# Patient Record
Sex: Female | Born: 1950
Health system: Southern US, Community
[De-identification: ages and names within clinical notes are randomized; demographics above are authoritative.]

## PROBLEM LIST (undated history)

## (undated) DIAGNOSIS — I499 Cardiac arrhythmia, unspecified: Secondary | ICD-10-CM

## (undated) DIAGNOSIS — I639 Cerebral infarction, unspecified: Secondary | ICD-10-CM

## (undated) DIAGNOSIS — I10 Essential (primary) hypertension: Secondary | ICD-10-CM

## (undated) DIAGNOSIS — G8929 Other chronic pain: Secondary | ICD-10-CM

## (undated) DIAGNOSIS — G43909 Migraine, unspecified, not intractable, without status migrainosus: Secondary | ICD-10-CM

## (undated) DIAGNOSIS — E7800 Pure hypercholesterolemia, unspecified: Secondary | ICD-10-CM

## (undated) DIAGNOSIS — M419 Scoliosis, unspecified: Secondary | ICD-10-CM

## (undated) DIAGNOSIS — M549 Dorsalgia, unspecified: Secondary | ICD-10-CM

## (undated) HISTORY — PX: ROTATOR CUFF REPAIR: SHX139

## (undated) HISTORY — PX: ABDOMINAL HYSTERECTOMY: SHX81

## (undated) HISTORY — PX: BUNIONECTOMY: SHX129

---

## 2010-11-20 ENCOUNTER — Emergency Department (HOSPITAL_COMMUNITY): Admission: EM | Admit: 2010-11-20 | Discharge: 2010-11-20 | Payer: Self-pay | Admitting: Emergency Medicine

## 2010-11-20 ENCOUNTER — Emergency Department (HOSPITAL_COMMUNITY): Admission: EM | Admit: 2010-11-20 | Discharge: 2010-11-20 | Payer: Self-pay | Admitting: Family Medicine

## 2011-05-03 ENCOUNTER — Emergency Department (HOSPITAL_COMMUNITY)
Admission: EM | Admit: 2011-05-03 | Discharge: 2011-05-03 | Disposition: A | Discharge status: Home / Self Care | Payer: Managed Care, Other (non HMO) | Attending: Emergency Medicine | Admitting: Emergency Medicine

## 2011-05-03 ENCOUNTER — Emergency Department (HOSPITAL_COMMUNITY): Payer: Managed Care, Other (non HMO)

## 2011-05-03 DIAGNOSIS — S0003XA Contusion of scalp, initial encounter: Secondary | ICD-10-CM | POA: Insufficient documentation

## 2011-05-03 DIAGNOSIS — Z79899 Other long term (current) drug therapy: Secondary | ICD-10-CM | POA: Insufficient documentation

## 2011-05-03 DIAGNOSIS — R296 Repeated falls: Secondary | ICD-10-CM | POA: Insufficient documentation

## 2011-05-03 DIAGNOSIS — Y9229 Other specified public building as the place of occurrence of the external cause: Secondary | ICD-10-CM | POA: Insufficient documentation

## 2011-05-03 DIAGNOSIS — H538 Other visual disturbances: Secondary | ICD-10-CM | POA: Insufficient documentation

## 2011-05-03 DIAGNOSIS — S0083XA Contusion of other part of head, initial encounter: Secondary | ICD-10-CM | POA: Insufficient documentation

## 2011-05-03 DIAGNOSIS — R569 Unspecified convulsions: Secondary | ICD-10-CM | POA: Insufficient documentation

## 2011-05-03 LAB — CBC
HCT: 35.3 % — ABNORMAL LOW (ref 36.0–46.0)
Hemoglobin: 12 g/dL (ref 12.0–15.0)
RBC: 3.66 MIL/uL — ABNORMAL LOW (ref 3.87–5.11)
WBC: 9.4 10*3/uL (ref 4.0–10.5)

## 2011-05-03 LAB — COMPREHENSIVE METABOLIC PANEL
ALT: 11 U/L (ref 0–35)
AST: 16 U/L (ref 0–37)
Alkaline Phosphatase: 81 U/L (ref 39–117)
CO2: 30 mEq/L (ref 19–32)
Calcium: 8.7 mg/dL (ref 8.4–10.5)
Chloride: 100 mEq/L (ref 96–112)
GFR calc Af Amer: >60 mL/min (ref >60)
GFR calc non Af Amer: >60 mL/min (ref >60)
Glucose, Bld: 83 mg/dL (ref 70–99)
Potassium: 3.5 mEq/L (ref 3.5–5.1)
Sodium: 136 mEq/L (ref 135–145)
Total Bilirubin: 0.2 mg/dL — ABNORMAL LOW (ref 0.3–1.2)

## 2011-05-03 LAB — DIFFERENTIAL
Basophils Absolute: 0 10*3/uL (ref 0.0–0.1)
Basophils Relative: 0 % (ref 0–1)
Lymphocytes Relative: 15 % (ref 12–46)
Monocytes Absolute: 0.6 10*3/uL (ref 0.1–1.0)
Neutro Abs: 7.2 10*3/uL (ref 1.7–7.7)
Neutrophils Relative %: 77 % (ref 43–77)

## 2011-05-03 LAB — URINE MICROSCOPIC-ADD ON

## 2011-05-03 LAB — URINALYSIS, ROUTINE W REFLEX MICROSCOPIC
Bilirubin Urine: NEGATIVE
Ketones, ur: NEGATIVE mg/dL
Nitrite: POSITIVE — AB
Protein, ur: NEGATIVE mg/dL
pH: 7.5 (ref 5.0–8.0)

## 2011-05-06 LAB — URINE CULTURE
Colony Count: >=100000
Culture  Setup Time: 201205031951

## 2012-07-30 ENCOUNTER — Encounter (HOSPITAL_BASED_OUTPATIENT_CLINIC_OR_DEPARTMENT_OTHER): Payer: Self-pay | Admitting: Emergency Medicine

## 2012-07-30 ENCOUNTER — Emergency Department (HOSPITAL_BASED_OUTPATIENT_CLINIC_OR_DEPARTMENT_OTHER)
Admission: EM | Admit: 2012-07-30 | Discharge: 2012-07-30 | Disposition: A | Discharge status: Home / Self Care | Payer: Self-pay | Attending: Emergency Medicine | Admitting: Emergency Medicine

## 2012-07-30 DIAGNOSIS — L089 Local infection of the skin and subcutaneous tissue, unspecified: Secondary | ICD-10-CM

## 2012-07-30 DIAGNOSIS — W540XXA Bitten by dog, initial encounter: Secondary | ICD-10-CM | POA: Insufficient documentation

## 2012-07-30 DIAGNOSIS — IMO0002 Reserved for concepts with insufficient information to code with codable children: Secondary | ICD-10-CM | POA: Insufficient documentation

## 2012-07-30 HISTORY — DX: Migraine, unspecified, not intractable, without status migrainosus: G43.909

## 2012-07-30 MED ORDER — ESTRADIOL 0.025 MG/24HR TD PTTW: 1.0000 | MEDICATED_PATCH | TRANSDERMAL | Status: DC

## 2012-07-30 MED ORDER — HYDROCODONE-ACETAMINOPHEN 5-325 MG PO TABS: 2.0000 | ORAL_TABLET | ORAL | Status: AC | PRN

## 2012-07-30 MED ORDER — FLUTICASONE-SALMETEROL 250-50 MCG/DOSE IN AEPB: 1.0000 | INHALATION_SPRAY | Freq: Once | RESPIRATORY_TRACT | Status: DC

## 2012-07-30 MED ORDER — DOXYCYCLINE HYCLATE 100 MG PO CAPS: 100.0000 mg | ORAL_CAPSULE | Freq: Two times a day (BID) | ORAL | Status: AC

## 2012-07-30 NOTE — ED Provider Notes (Signed)
History     CSN: [id number]  Arrival date & time 07/30/12  1712   First MD Initiated Contact with Patient 07/30/12 1747      Chief Complaint  Patient presents with  . Abrasion  . Arm Pain  . Nausea    (Consider location/radiation/quality/duration/timing/severity/associated sxs/prior treatment) Patient is a 62 y.o. female presenting with arm pain. The history is provided by the patient.  Arm Pain This is a new problem. The current episode started 1 to 4 weeks ago. The problem occurs constantly. The problem has been gradually worsening. Nothing aggravates the symptoms. She has tried nothing for the symptoms.  Pt reports her puppy is chewing on her and scratching her.   Pt also reports she is out of her hormone patch and request rx until she can get an MD. Pt also request advair.  Past Medical History  Diagnosis Date  . Migraine     Past Surgical History  Procedure Date  . Abdominal hysterectomy   . Rotator cuff repair     No family history on file.  History  Substance Use Topics  . Smoking status: Not on file  . Smokeless tobacco: Not on file  . Alcohol Use:     OB History    Grav Para Term Preterm Abortions TAB SAB Ect Mult Living                  Review of Systems  Skin: Positive for wound.  All other systems reviewed and are negative.    Allergies  Penicillins; Aspirin; Darvon; Ibuprofen; Imitrex; and Sulfa antibiotics  Home Medications   Current Outpatient Rx  Name Route Sig Dispense Refill  . DOXYCYCLINE HYCLATE 100 MG PO CAPS Oral Take 1 capsule (100 mg total) by mouth 2 (two) times daily. 20 capsule 0  . ESTRADIOL 0.025 MG/24HR TD PTTW Transdermal Place 1 patch onto the skin 2 (two) times a week. 8 patch 12    BP 99/65  Pulse 93  Temp 98.5 F (36.9 C) (Oral)  Resp 20  Ht 5\' 5"  (1.651 m)  Wt 114 lb (51.71 kg)  BMI 18.97 kg/m2  SpO2 98%  Physical Exam  Nursing note and vitals reviewed. Constitutional: She is oriented to person, place,  and time. She appears well-developed and well-nourished.  HENT:  Head: Normocephalic and atraumatic.  Musculoskeletal: She exhibits tenderness.       Abrasions, puntures, erythema   Neurological: She is alert and oriented to person, place, and time. She has normal reflexes.  Skin: There is erythema.       Multiple abrasions,  Superficial punture wounds erythema left arm 2cm area around a punture,  Right arm,  Multiple puntures,one open area, erythematous    ED Course  Procedures (including critical care time)  Labs Reviewed - No data to display No results found.       MDM  rx for doxy,  Advair, vivelle and hydrocodone for pain       Lonia Skinner [place], [place] 07/30/12 1817  Lonia Skinner [place], [place] 07/30/12 [phone]

## 2012-07-30 NOTE — ED Notes (Signed)
Pt has multiple scratches to arms.  Pt states her puppy caused it.  Pt states she started having nausea this am and right forearm is starting to ache.

## 2012-08-03 NOTE — ED Provider Notes (Signed)
Medical screening examination/treatment/procedure(s) were performed by non-physician practitioner and as supervising physician I was immediately available for consultation/collaboration.  Vonnetta Akey, MD 08/03/12 1455 

## 2012-09-02 ENCOUNTER — Emergency Department (HOSPITAL_BASED_OUTPATIENT_CLINIC_OR_DEPARTMENT_OTHER)
Admission: EM | Admit: 2012-09-02 | Discharge: 2012-09-03 | Disposition: A | Discharge status: Home / Self Care | Payer: Self-pay | Attending: Emergency Medicine | Admitting: Emergency Medicine

## 2012-09-02 ENCOUNTER — Emergency Department (HOSPITAL_BASED_OUTPATIENT_CLINIC_OR_DEPARTMENT_OTHER): Payer: Self-pay

## 2012-09-02 ENCOUNTER — Encounter (HOSPITAL_BASED_OUTPATIENT_CLINIC_OR_DEPARTMENT_OTHER): Payer: Self-pay | Admitting: *Deleted

## 2012-09-02 DIAGNOSIS — M549 Dorsalgia, unspecified: Secondary | ICD-10-CM | POA: Insufficient documentation

## 2012-09-02 DIAGNOSIS — N390 Urinary tract infection, site not specified: Secondary | ICD-10-CM | POA: Insufficient documentation

## 2012-09-02 DIAGNOSIS — F17200 Nicotine dependence, unspecified, uncomplicated: Secondary | ICD-10-CM | POA: Insufficient documentation

## 2012-09-02 LAB — URINALYSIS, ROUTINE W REFLEX MICROSCOPIC
Bilirubin Urine: NEGATIVE
Glucose, UA: NEGATIVE mg/dL
Ketones, ur: NEGATIVE mg/dL
Protein, ur: NEGATIVE mg/dL
pH: 6 (ref 5.0–8.0)

## 2012-09-02 MED ORDER — DEXAMETHASONE SODIUM PHOSPHATE 10 MG/ML IJ SOLN
10.0000 mg | Freq: Once | INTRAMUSCULAR | Status: AC
  Administered 2012-09-02: 10 mg via INTRAVENOUS
  Filled 2012-09-02: qty 1

## 2012-09-02 MED ORDER — HYDROMORPHONE HCL PF 1 MG/ML IJ SOLN
1.0000 mg | Freq: Once | INTRAMUSCULAR | Status: AC
  Administered 2012-09-02: 1 mg via INTRAVENOUS
  Filled 2012-09-02: qty 1

## 2012-09-02 NOTE — ED Provider Notes (Signed)
History     CSN: [id number]  Arrival date & time 09/02/12  2017   First MD Initiated Contact with Patient 09/02/12 2256      Chief Complaint  Patient presents with  . Back Pain    (Consider location/radiation/quality/duration/timing/severity/associated sxs/prior treatment) HPI Comments: 62 y/o female - has had back pain for "some time", for about 4 weeks, is getting worse, doesn't go tot he doctor often.  Has been taking flexeril without relief.  The pain is in the lower back, above the waist on the L, and radiates into the L hip and to the foot.  Stabbing pain, constant, more painful with movement, especially with bending at the waist.  Improved with laying on side curled up.  Also complains of headache X 3 days and swellignin the R neck, no f/c, mild nausea, no vomiting.  Also c/o numbness in the L leg which has been present X 1 week.  This a complete leg numbness.  She has been ambulating but has pain with ambulating.  Left leg gets tired with walking.    No hx of IVDU, fevers, CA, intermittent urinary hesitancy, no Urinary retention or incontinence but has had some strong smelling urine.  She has no weakness of the LLE.  No hx of back pain.   Patient is a 62 y.o. female presenting with back pain. The history is provided by the patient.  Back Pain     Past Medical History  Diagnosis Date  . Migraine     Past Surgical History  Procedure Date  . Abdominal hysterectomy   . Rotator cuff repair     No family history on file.  History  Substance Use Topics  . Smoking status: Current Everyday Smoker -- 0.5 packs/day  . Smokeless tobacco: Not on file  . Alcohol Use: No    OB History    Grav Para Term Preterm Abortions TAB SAB Ect Mult Living                  Review of Systems  Musculoskeletal: Positive for back pain.  All other systems reviewed and are negative.    Allergies  Penicillins; Aspirin; Darvon; Ibuprofen; Imitrex; Monosodium glutamate; Nitrates, organic;  Nsaids; and Sulfa antibiotics  Home Medications   Current Outpatient Rx  Name Route Sig Dispense Refill  . ACETAMINOPHEN 500 MG PO TABS Oral Take 1,000-1,500 mg by mouth every 6 (six) hours as needed. For back pain    . ASPIRIN 81 MG PO TABS Oral Take 81 mg by mouth daily.    Marland Kitchen CALCIUM CARBONATE ANTACID 1000 MG PO CHEW Oral Chew 1,000 mg by mouth daily.    Marland Kitchen VITAMIN D 1000 UNITS PO TABS Oral Take 2,000 Units by mouth 2 (two) times daily.     . CYCLOBENZAPRINE HCL 5 MG PO TABS Oral Take 5 mg by mouth at bedtime as needed. For pain and sleep    . ESCITALOPRAM OXALATE 10 MG PO TABS Oral Take 10 mg by mouth daily.    Marland Kitchen ESTRADIOL 0.025 MG/24HR TD PTTW Transdermal Place 1 patch onto the skin 2 (two) times a week. 8 patch 12  . METOPROLOL TARTRATE 25 MG PO TABS Oral Take 12.5 mg by mouth 2 (two) times daily.    Marland Kitchen SIMVASTATIN 10 MG PO TABS Oral Take 10 mg by mouth at bedtime.    . OXYCODONE-ACETAMINOPHEN 5-325 MG PO TABS Oral Take 1 tablet by mouth every 4 (four) hours as needed for pain. 20 tablet  0    BP 107/51  Pulse 106  Temp 98 F (36.7 C) (Oral)  Resp 18  SpO2 98%  Physical Exam  Nursing note and vitals reviewed. Constitutional: She appears well-developed and well-nourished. No distress.  HENT:  Head: Normocephalic and atraumatic.  Mouth/Throat: Oropharynx is clear and moist. No oropharyngeal exudate.  Eyes: Conjunctivae and EOM are normal. Pupils are equal, round, and reactive to light. Right eye exhibits no discharge. Left eye exhibits no discharge. No scleral icterus.  Neck: Normal range of motion. Neck supple. No JVD present. No thyromegaly present.  Cardiovascular: Normal rate, regular rhythm, normal heart sounds and intact distal pulses.  Exam reveals no gallop and no friction rub.   No murmur heard. Pulmonary/Chest: Effort normal and breath sounds normal. No respiratory distress. She has no wheezes. She has no rales.  Abdominal: Soft. Bowel sounds are normal. She exhibits no  distension and no mass. There is no tenderness.  Musculoskeletal: Normal range of motion. She exhibits no edema and no tenderness.  Lymphadenopathy:    She has no cervical adenopathy.  Neurological: She is alert. Coordination normal.       Neurologic exam:  Speech clear, pupils equal round reactive to light, extraocular movements intact  Normal peripheral visual fields Cranial nerves III through XII normal including no facial droop Follows commands, moves all extremities x4, normal strength to bilateral upper and lower extremities at all major muscle groups including grip Sensation normal to light touch and pinprick of bil UE's, but LLE with dec sens to light touch and pinprick.  + straight leg raise test.  No crossed straight leg raise sign Coordination intact, no limb ataxia, finger-nose-finger normal Rapid alternating movements normal No pronator drift Gait normal   Skin: Skin is warm and dry. No rash noted. No erythema.  Psychiatric: She has a normal mood and affect. Her behavior is normal.    ED Course  Procedures (including critical care time)  Labs Reviewed  URINALYSIS, ROUTINE W REFLEX MICROSCOPIC - Abnormal; Notable for the following:    Nitrite POSITIVE (*)     All other components within normal limits  URINE MICROSCOPIC-ADD ON - Abnormal; Notable for the following:    Bacteria, UA MANY (*)     All other components within normal limits   Dg Lumbar Spine Complete  09/03/2012  *RADIOLOGY REPORT*  Clinical Data: Back pain for 3 weeks with left upper and lower extremity numbness.  No known injury.  LUMBAR SPINE - COMPLETE 4+ VIEW  Comparison: Chest and rib radiographs 11/20/2010.  Findings: There are small ribs at T12 and five lumbar type vertebral bodies.  There is a moderate convex right scoliosis centered at L3.  There is a convex left lower thoracic scoliosis. The lateral alignment is normal.  There is lower lumbar spondylosis with disc space loss, intervertebral spurring  and facet hypertrophy.  There is no evidence of acute fracture or pars defect.  Bilateral pelvic calcifications are likely phleboliths.  IMPRESSION: Thoracolumbar scoliosis with associated spondylosis.  No acute osseous findings.   Original Report Authenticated By: Gerrianne Scale, M.D.      1. Back pain   2. UTI (lower urinary tract infection)       MDM  Concern for a pinched nerve / sciatica - will arrange outpt MRI, and needs NS follow up.  Decadron, toradol, dilaudid   11:35, d/w Dr. Franky Macho who will see in clinic - will set up for MRIin AM  Patient was reevaluated and has  significant improvement in her symptoms after receiving pain medication and Decadron. She has ambulated to the bathroom with minimal difficulty and had less than 50 cc of postvoid residual after urinating. At this time until the patient is stable for discharge and followup with the neurosurgeon who has agreed to see her in his clinic. An MRI has been scheduled for the morning and she will be discharged with Bactrim and Percocet.  Vida Roller, MD 09/03/12 [phone]

## 2012-09-02 NOTE — ED Notes (Signed)
Back pain for several weeks. No known injury.

## 2012-09-03 MED ORDER — OXYCODONE-ACETAMINOPHEN 5-325 MG PO TABS: 1.0000 | ORAL_TABLET | ORAL | Status: DC | PRN

## 2012-09-03 MED ORDER — CIPROFLOXACIN HCL 500 MG PO TABS: 500.0000 mg | ORAL_TABLET | Freq: Two times a day (BID) | ORAL | Status: AC

## 2012-09-03 NOTE — ED Notes (Signed)
Pt. Up to restroom - walks with no assistance and stedy gait.  Pt. Is in no distress and reports no pain at this time.  Pt. Had output of urine with reports to Dr. Hyacinth Meeker.  Pt. Able to move legs bilat. And bend knees bilat. Pt. Bends over to put shoes on and is able to stand erect after bending.

## 2012-09-04 LAB — URINE CULTURE

## 2012-09-05 NOTE — ED Notes (Signed)
+  Urine. Patient treated with Cipro. Sensitive to same. Per protocol MD. °

## 2012-09-09 ENCOUNTER — Emergency Department (HOSPITAL_BASED_OUTPATIENT_CLINIC_OR_DEPARTMENT_OTHER)
Admission: EM | Admit: 2012-09-09 | Discharge: 2012-09-10 | Disposition: A | Discharge status: Home / Self Care | Payer: Self-pay | Attending: Emergency Medicine | Admitting: Emergency Medicine

## 2012-09-09 ENCOUNTER — Emergency Department (HOSPITAL_BASED_OUTPATIENT_CLINIC_OR_DEPARTMENT_OTHER): Payer: Self-pay

## 2012-09-09 ENCOUNTER — Encounter (HOSPITAL_BASED_OUTPATIENT_CLINIC_OR_DEPARTMENT_OTHER): Payer: Self-pay

## 2012-09-09 DIAGNOSIS — Z79899 Other long term (current) drug therapy: Secondary | ICD-10-CM | POA: Insufficient documentation

## 2012-09-09 DIAGNOSIS — R509 Fever, unspecified: Secondary | ICD-10-CM | POA: Insufficient documentation

## 2012-09-09 DIAGNOSIS — R21 Rash and other nonspecific skin eruption: Secondary | ICD-10-CM | POA: Insufficient documentation

## 2012-09-09 DIAGNOSIS — R109 Unspecified abdominal pain: Secondary | ICD-10-CM | POA: Insufficient documentation

## 2012-09-09 DIAGNOSIS — A779 Spotted fever, unspecified: Secondary | ICD-10-CM | POA: Insufficient documentation

## 2012-09-09 DIAGNOSIS — A770 Spotted fever due to Rickettsia rickettsii: Secondary | ICD-10-CM

## 2012-09-09 DIAGNOSIS — M545 Low back pain, unspecified: Secondary | ICD-10-CM | POA: Insufficient documentation

## 2012-09-09 DIAGNOSIS — F17200 Nicotine dependence, unspecified, uncomplicated: Secondary | ICD-10-CM | POA: Insufficient documentation

## 2012-09-09 LAB — CBC WITH DIFFERENTIAL/PLATELET
Basophils Relative: 0 % (ref 0–1)
Hemoglobin: 10.9 g/dL — ABNORMAL LOW (ref 12.0–15.0)
Lymphocytes Relative: 17 % (ref 12–46)
Lymphs Abs: 1.6 10*3/uL (ref 0.7–4.0)
MCHC: 33.6 g/dL (ref 30.0–36.0)
Monocytes Relative: 8 % (ref 3–12)
Neutro Abs: 6.9 10*3/uL (ref 1.7–7.7)
Neutrophils Relative %: 72 % (ref 43–77)
RBC: 3.38 MIL/uL — ABNORMAL LOW (ref 3.87–5.11)
WBC: 9.6 10*3/uL (ref 4.0–10.5)

## 2012-09-09 LAB — URINALYSIS, ROUTINE W REFLEX MICROSCOPIC
Leukocytes, UA: NEGATIVE
Nitrite: NEGATIVE
Specific Gravity, Urine: 1.013 (ref 1.005–1.030)
pH: 6.5 (ref 5.0–8.0)

## 2012-09-09 LAB — COMPREHENSIVE METABOLIC PANEL
Albumin: 3.3 g/dL — ABNORMAL LOW (ref 3.5–5.2)
Alkaline Phosphatase: 80 U/L (ref 39–117)
BUN: 9 mg/dL (ref 6–23)
Chloride: 95 mEq/L — ABNORMAL LOW (ref 96–112)
Potassium: 3.9 mEq/L (ref 3.5–5.1)
Total Bilirubin: 0.2 mg/dL — ABNORMAL LOW (ref 0.3–1.2)

## 2012-09-09 MED ORDER — DOXYCYCLINE HYCLATE 100 MG PO CAPS: 100.0000 mg | ORAL_CAPSULE | Freq: Two times a day (BID) | ORAL | Status: AC

## 2012-09-09 MED ORDER — ONDANSETRON HCL 4 MG/2ML IJ SOLN
4.0000 mg | Freq: Once | INTRAMUSCULAR | Status: AC
  Administered 2012-09-09: 4 mg via INTRAVENOUS
  Filled 2012-09-09: qty 2

## 2012-09-09 MED ORDER — SODIUM CHLORIDE 0.9 % IV BOLUS (SEPSIS)
1000.0000 mL | Freq: Once | INTRAVENOUS | Status: AC
  Administered 2012-09-09: 1000 mL via INTRAVENOUS

## 2012-09-09 MED ORDER — DOXYCYCLINE HYCLATE 100 MG PO TABS
100.0000 mg | ORAL_TABLET | Freq: Once | ORAL | Status: AC
  Administered 2012-09-09: 100 mg via ORAL
  Filled 2012-09-09: qty 1

## 2012-09-09 MED ORDER — ACETAMINOPHEN 325 MG PO TABS
650.0000 mg | ORAL_TABLET | Freq: Once | ORAL | Status: AC
  Administered 2012-09-09: 650 mg via ORAL
  Filled 2012-09-09: qty 2

## 2012-09-09 NOTE — ED Notes (Signed)
Recently tx for UTI-completed cipro-c/o cont'd fever, chills, no appetite, nausea, left flank/back pain

## 2012-09-09 NOTE — ED Provider Notes (Signed)
History     CSN: [id number]  Arrival date & time 09/09/12  1955   First MD Initiated Contact with Patient 09/09/12 2036      Chief Complaint  Patient presents with  . Urinary Tract Infection    (Consider location/radiation/quality/duration/timing/severity/associated sxs/prior treatment) HPI Pt reports she was seen in the ED about a week ago for flank pain, found to have UTI and given course of Cipro. States she initially felt better but about 5 days ago she began to have general malaise, diffuse myalgias, headache, poor appetite, nausea and intermittent fever reportedly to 102F yesterday. She finished her course of Cipro, states pain has been particularly bad in L flank and lower back. Of note, she was also scheduled for MRI during her prior ED visit to eval sciatica due to location of her pain, but did not have that test done. She reports several scratches from her dog on her arms but no drainage. She also reports removing a tick about a week ago.  Past Medical History  Diagnosis Date  . Migraine     Past Surgical History  Procedure Date  . Abdominal hysterectomy   . Rotator cuff repair   . Bunionectomy     No family history on file.  History  Substance Use Topics  . Smoking status: Current Everyday Smoker -- 0.5 packs/day  . Smokeless tobacco: Not on file  . Alcohol Use: No    OB History    Grav Para Term Preterm Abortions TAB SAB Ect Mult Living                  Review of Systems All other systems reviewed and are negative except as noted in HPI.   Allergies  Ciprofloxacin; Penicillins; Aspirin; Darvon; Ibuprofen; Imitrex; Monosodium glutamate; Nitrates, organic; Nsaids; and Sulfa antibiotics  Home Medications   Current Outpatient Rx  Name Route Sig Dispense Refill  . ACETAMINOPHEN 500 MG PO TABS Oral Take 1,000-1,500 mg by mouth every 6 (six) hours as needed. For back pain    . ASPIRIN 81 MG PO TABS Oral Take 81 mg by mouth daily.    Marland Kitchen CALCIUM CARBONATE  ANTACID 1000 MG PO CHEW Oral Chew 1,000 mg by mouth daily.    Marland Kitchen VITAMIN D 1000 UNITS PO TABS Oral Take 2,000 Units by mouth 2 (two) times daily.     . CYCLOBENZAPRINE HCL 5 MG PO TABS Oral Take 5 mg by mouth at bedtime as needed. For pain and sleep    . ESTRADIOL 0.025 MG/24HR TD PTTW Transdermal Place 1 patch onto the skin 2 (two) times a week. 8 patch 12  . METOPROLOL TARTRATE 25 MG PO TABS Oral Take 12.5 mg by mouth 2 (two) times daily.    Marland Kitchen SIMVASTATIN 10 MG PO TABS Oral Take 10 mg by mouth at bedtime.    Marland Kitchen CIPROFLOXACIN HCL 500 MG PO TABS Oral Take 1 tablet (500 mg total) by mouth 2 (two) times daily. 6 tablet 0  . ESCITALOPRAM OXALATE 10 MG PO TABS Oral Take 10 mg by mouth daily.    . OXYCODONE-ACETAMINOPHEN 5-325 MG PO TABS Oral Take 1 tablet by mouth every 4 (four) hours as needed. For pain.      BP 92/61  Pulse 108  Temp 99 F (37.2 C) (Oral)  Resp 16  Ht 5\' 5"  (1.651 m)  Wt 112 lb (50.803 kg)  BMI 18.64 kg/m2  SpO2 98%  Physical Exam  Nursing note and vitals reviewed. Constitutional: She  is oriented to person, place, and time. She appears well-developed and well-nourished.  HENT:  Head: Normocephalic and atraumatic.       Dry mouth  Eyes: EOM are normal. Pupils are equal, round, and reactive to light.  Neck: Normal range of motion. Neck supple.  Cardiovascular: Normal rate, normal heart sounds and intact distal pulses.   Pulmonary/Chest: Effort normal and breath sounds normal.  Abdominal: Bowel sounds are normal. She exhibits no distension. There is no tenderness.  Musculoskeletal: Normal range of motion. She exhibits no edema and no tenderness.  Neurological: She is alert and oriented to person, place, and time. She has normal strength. No cranial nerve deficit or sensory deficit.  Skin: Skin is warm and dry. Rash (superficial abrasions to arms without signs of secondary infection) noted.  Psychiatric: She has a normal mood and affect.    ED Course  Procedures  (including critical care time)   Labs Reviewed  URINALYSIS, ROUTINE W REFLEX MICROSCOPIC  CBC WITH DIFFERENTIAL  COMPREHENSIVE METABOLIC PANEL  URINE CULTURE  ROCKY MTN SPOTTED FVR AB, IGG-BLOOD  ROCKY MTN SPOTTED FVR AB, IGM-BLOOD   No results found.   No diagnosis found.    MDM  Pt appear dehydrated, but afebrile in the ED. UA is neg for infection, prior culture was Cipro sensitive, will check for other sources of infection but concern for occult RMSF infection as well. Titers sent. IVF and antiemetics ordered.  10:41 PM Pt feeling better, tolerating PO fluids, labs unremarkable here. Will start doxy empirically for RMSF      Charles B. Bernette Mayers, MD 09/09/12 2242

## 2012-09-09 NOTE — ED Notes (Signed)
Pt given a oke to see if it would be tolerated per Dr Janese Banks order

## 2012-09-09 NOTE — ED Notes (Signed)
Pt complained of small abrasion over both arms that she states has been caused by her dog scratching her.  No signs of infection noted

## 2012-09-10 LAB — ROCKY MTN SPOTTED FVR AB, IGM-BLOOD: RMSF IgM: 0.17 IV (ref 0.00–0.89)

## 2012-09-11 LAB — URINE CULTURE: Colony Count: 40000

## 2012-09-12 NOTE — ED Notes (Signed)
+  Urine. Patient given Doxycycline. No sensitivities listed. Chart sent to EDP office for review. °

## 2012-09-15 ENCOUNTER — Telehealth (HOSPITAL_COMMUNITY): Payer: Self-pay | Admitting: *Deleted

## 2012-09-15 NOTE — ED Notes (Signed)
Patient wants to come back for recheck.

## 2013-01-13 ENCOUNTER — Encounter (HOSPITAL_BASED_OUTPATIENT_CLINIC_OR_DEPARTMENT_OTHER): Payer: Self-pay

## 2013-01-13 ENCOUNTER — Emergency Department (HOSPITAL_BASED_OUTPATIENT_CLINIC_OR_DEPARTMENT_OTHER): Payer: Self-pay

## 2013-01-13 ENCOUNTER — Emergency Department (HOSPITAL_BASED_OUTPATIENT_CLINIC_OR_DEPARTMENT_OTHER)
Admission: EM | Admit: 2013-01-13 | Discharge: 2013-01-13 | Disposition: A | Discharge status: Home / Self Care | Payer: Self-pay | Attending: Emergency Medicine | Admitting: Emergency Medicine

## 2013-01-13 DIAGNOSIS — E7800 Pure hypercholesterolemia, unspecified: Secondary | ICD-10-CM | POA: Insufficient documentation

## 2013-01-13 DIAGNOSIS — R51 Headache: Secondary | ICD-10-CM | POA: Insufficient documentation

## 2013-01-13 DIAGNOSIS — M4120 Other idiopathic scoliosis, site unspecified: Secondary | ICD-10-CM | POA: Insufficient documentation

## 2013-01-13 DIAGNOSIS — Z7982 Long term (current) use of aspirin: Secondary | ICD-10-CM | POA: Insufficient documentation

## 2013-01-13 DIAGNOSIS — M549 Dorsalgia, unspecified: Secondary | ICD-10-CM | POA: Insufficient documentation

## 2013-01-13 DIAGNOSIS — Z79899 Other long term (current) drug therapy: Secondary | ICD-10-CM | POA: Insufficient documentation

## 2013-01-13 DIAGNOSIS — F17200 Nicotine dependence, unspecified, uncomplicated: Secondary | ICD-10-CM | POA: Insufficient documentation

## 2013-01-13 DIAGNOSIS — G8929 Other chronic pain: Secondary | ICD-10-CM

## 2013-01-13 DIAGNOSIS — Z8673 Personal history of transient ischemic attack (TIA), and cerebral infarction without residual deficits: Secondary | ICD-10-CM | POA: Insufficient documentation

## 2013-01-13 DIAGNOSIS — Z8679 Personal history of other diseases of the circulatory system: Secondary | ICD-10-CM | POA: Insufficient documentation

## 2013-01-13 HISTORY — DX: Scoliosis, unspecified: M41.9

## 2013-01-13 HISTORY — DX: Cardiac arrhythmia, unspecified: I49.9

## 2013-01-13 HISTORY — DX: Pure hypercholesterolemia, unspecified: E78.00

## 2013-01-13 HISTORY — DX: Cerebral infarction, unspecified: I63.9

## 2013-01-13 MED ORDER — ESTRADIOL 0.1 MG/24HR TD PTWK: 1.0000 | MEDICATED_PATCH | TRANSDERMAL | Status: DC

## 2013-01-13 MED ORDER — SIMVASTATIN 20 MG PO TABS: 20.0000 mg | ORAL_TABLET | Freq: Every day | ORAL | Status: DC

## 2013-01-13 MED ORDER — ESCITALOPRAM OXALATE 20 MG PO TABS: 20.0000 mg | ORAL_TABLET | Freq: Every day | ORAL | Status: DC

## 2013-01-13 MED ORDER — DIPHENHYDRAMINE HCL 50 MG/ML IJ SOLN: 25.0000 mg | Freq: Once | INTRAMUSCULAR | Status: DC

## 2013-01-13 MED ORDER — CYCLOBENZAPRINE HCL 5 MG PO TABS: 5.0000 mg | ORAL_TABLET | Freq: Two times a day (BID) | ORAL | Status: DC | PRN

## 2013-01-13 MED ORDER — ACETAMINOPHEN 325 MG PO TABS
650.0000 mg | ORAL_TABLET | Freq: Once | ORAL | Status: AC
  Administered 2013-01-13: 650 mg via ORAL
  Filled 2013-01-13: qty 2

## 2013-01-13 MED ORDER — METOPROLOL TARTRATE 25 MG PO TABS: 12.5000 mg | ORAL_TABLET | Freq: Two times a day (BID) | ORAL | Status: DC

## 2013-01-13 MED ORDER — METOCLOPRAMIDE HCL 5 MG/ML IJ SOLN: 10.0000 mg | Freq: Once | INTRAMUSCULAR | Status: DC

## 2013-01-13 NOTE — ED Notes (Signed)
Pt with ?s if EDNP was giving rx for "migraine"-spoke with EDNP and advised pt that pt reported her migraines are usually r/t to no estrogen patch and that she was giving her rx estrogen patch

## 2013-01-13 NOTE — ED Notes (Signed)
Pt states she is alone and driving-explained to pt there are no meds to treat HA due to she is driving and multiple allergies per EDNP advisement-pt agreeable and requests tylenol

## 2013-01-13 NOTE — ED Provider Notes (Signed)
Medical screening examination/treatment/procedure(s) were performed by non-physician practitioner and as supervising physician I was immediately available for consultation/collaboration.   Rolan Bucco, MD 01/13/13 [phone]

## 2013-01-13 NOTE — ED Provider Notes (Signed)
History     CSN: [id number]  Arrival date & time 01/13/13  1234   First MD Initiated Contact with Patient 01/13/13 1245      Chief Complaint  Patient presents with  . Headache    (Consider location/radiation/quality/duration/timing/severity/associated sxs/prior treatment) HPI Comments: Pt states that she also needs to find a primary care doctor and she is out of her pain medication:pt states that she has also had a persistent cough without fever  Patient is a 63 y.o. female presenting with headaches. The history is provided by the patient. No language interpreter was used.  Headache  This is a recurrent problem. The current episode started 2 days ago. The problem occurs constantly. The problem has not changed since onset.Associated with: pt states that she ran out of her estrogen patch and it causes headaches. The pain is located in the left unilateral region. The quality of the pain is described as throbbing. The pain is moderate. The pain does not radiate. Pertinent negatives include no fever, no chest pressure, no near-syncope, no orthopnea and no palpitations. She has tried acetaminophen for the symptoms. The treatment provided mild relief.    Past Medical History  Diagnosis Date  . Migraine   . High cholesterol   . Cardiac arrhythmia   . Stroke   . Scoliosis     Past Surgical History  Procedure Date  . Abdominal hysterectomy   . Rotator cuff repair   . Bunionectomy     No family history on file.  History  Substance Use Topics  . Smoking status: Current Every Day Smoker -- 0.5 packs/day  . Smokeless tobacco: Not on file  . Alcohol Use: No    OB History    Grav Para Term Preterm Abortions TAB SAB Ect Mult Living                  Review of Systems  Constitutional: Negative for fever.  Respiratory: Negative.   Cardiovascular: Negative for palpitations, orthopnea and near-syncope.  Neurological: Positive for headaches.    Allergies  Ciprofloxacin;  Penicillins; Aspirin; Darvon; Ibuprofen; Imitrex; Monosodium glutamate; Nitrates, organic; Nsaids; and Sulfa antibiotics  Home Medications   Current Outpatient Rx  Name  Route  Sig  Dispense  Refill  . ESCITALOPRAM OXALATE 20 MG PO TABS   Oral   Take 20 mg by mouth daily.         Marland Kitchen ESTRADIOL 0.1 MG/24HR TD PTTW   Transdermal   Place 1 patch onto the skin 2 (two) times a week.         Marland Kitchen SIMVASTATIN 20 MG PO TABS   Oral   Take 20 mg by mouth every evening.         . ACETAMINOPHEN 500 MG PO TABS   Oral   Take 1,000-1,500 mg by mouth every 6 (six) hours as needed. For back pain         . ASPIRIN 81 MG PO TABS   Oral   Take 81 mg by mouth daily.         Marland Kitchen CALCIUM CARBONATE ANTACID 1000 MG PO CHEW   Oral   Chew 1,000 mg by mouth daily.         Marland Kitchen VITAMIN D 1000 UNITS PO TABS   Oral   Take 2,000 Units by mouth 2 (two) times daily.          . CYCLOBENZAPRINE HCL 5 MG PO TABS   Oral   Take 5 mg by  mouth 3 (three) times daily as needed. For pain and sleep         . CYCLOBENZAPRINE HCL 5 MG PO TABS   Oral   Take 1 tablet (5 mg total) by mouth 2 (two) times daily as needed for muscle spasms.   20 tablet   0   . ESCITALOPRAM OXALATE 10 MG PO TABS   Oral   Take 10 mg by mouth daily.         Marland Kitchen ESCITALOPRAM OXALATE 20 MG PO TABS   Oral   Take 1 tablet (20 mg total) by mouth daily.   30 tablet   0   . ESTRADIOL 0.1 MG/24HR TD PTWK   Transdermal   Place 1 patch (0.1 mg total) onto the skin once a week.   4 patch   0   . ESTRADIOL 0.025 MG/24HR TD PTTW   Transdermal   Place 1 patch onto the skin 2 (two) times a week.         Marland Kitchen METOPROLOL TARTRATE 25 MG PO TABS   Oral   Take 0.5 tablets (12.5 mg total) by mouth 2 (two) times daily.   30 tablet   0   . METOPROLOL TARTRATE 25 MG PO TABS   Oral   Take 12.5 mg by mouth 2 (two) times daily.         Marland Kitchen SIMVASTATIN 10 MG PO TABS   Oral   Take 10 mg by mouth at bedtime.         Marland Kitchen SIMVASTATIN 20  MG PO TABS   Oral   Take 1 tablet (20 mg total) by mouth daily.   30 tablet   0     BP 132/81  Pulse 94  Temp 97.5 F (36.4 C) (Oral)  Resp 22  Ht 5\' 5"  (1.651 m)  Wt 115 lb (52.164 kg)  BMI 19.14 kg/m2  SpO2 97%  Physical Exam  Nursing note and vitals reviewed. Constitutional: She appears well-developed and well-nourished.  HENT:  Head: Normocephalic and atraumatic.  Eyes: Conjunctivae normal and EOM are normal.  Neck: Neck supple.  Cardiovascular: Normal rate and regular rhythm.   Pulmonary/Chest: Effort normal and breath sounds normal.  Abdominal: Soft. Bowel sounds are normal. There is no rebound.  Musculoskeletal: Normal range of motion.  Skin: Skin is warm and dry.  Psychiatric: She has a normal mood and affect.    ED Course  Procedures (including critical care time)  Labs Reviewed - No data to display Dg Chest 2 View  01/13/2013  *RADIOLOGY REPORT*  Clinical Data: Cough  CHEST - 2 VIEW  Comparison: 09/09/2012  Findings: Cardiomediastinal silhouette is stable.  S-shaped thoracolumbar scoliosis again noted.  No acute infiltrate or pulmonary edema.  Central mild bronchitic changes.  IMPRESSION: No acute infiltrate or pulmonary edema.  Central mild bronchitic changes.   S-shaped thoracolumbar scoliosis.   Original Report Authenticated By: Natasha Mead, M.D.      1. Headache   2. Chronic back pain       MDM  Refilled 1 months worth of pts medication gave a referal and pt does not have a ride so nothing given here in the er as a headache        Teressa Lower, NP 01/13/13 1347

## 2013-01-13 NOTE — ED Notes (Signed)
Pt states she is here b/c she ran out of rxs and does not have local doctor due to she has just moved back to area-c/o HA due to she is out of her estrogen patch

## 2013-01-14 MED ORDER — CYCLOBENZAPRINE HCL 5 MG PO TABS: 5.0000 mg | ORAL_TABLET | Freq: Two times a day (BID) | ORAL | Status: DC | PRN

## 2013-01-14 MED ORDER — ESTRADIOL 0.1 MG/24HR TD PTWK: 1.0000 | MEDICATED_PATCH | TRANSDERMAL | Status: DC

## 2013-01-14 MED ORDER — ESCITALOPRAM OXALATE 20 MG PO TABS: 20.0000 mg | ORAL_TABLET | Freq: Every day | ORAL | Status: DC

## 2013-01-14 MED ORDER — SIMVASTATIN 20 MG PO TABS: 20.0000 mg | ORAL_TABLET | Freq: Every day | ORAL | Status: DC

## 2013-01-14 MED ORDER — METOPROLOL TARTRATE 25 MG PO TABS: 12.5000 mg | ORAL_TABLET | Freq: Two times a day (BID) | ORAL | Status: DC

## 2013-01-14 NOTE — ED Notes (Signed)
rec'd call from [facility] stating that prescriptions for pt were never rec'd. Rxs reprinted by J. Idol, PA and faxed to [facility].

## 2013-05-02 ENCOUNTER — Emergency Department (HOSPITAL_BASED_OUTPATIENT_CLINIC_OR_DEPARTMENT_OTHER)
Admission: EM | Admit: 2013-05-02 | Discharge: 2013-05-02 | Disposition: A | Discharge status: Home / Self Care | Payer: Self-pay | Attending: Emergency Medicine | Admitting: Emergency Medicine

## 2013-05-02 ENCOUNTER — Encounter (HOSPITAL_BASED_OUTPATIENT_CLINIC_OR_DEPARTMENT_OTHER): Payer: Self-pay

## 2013-05-02 DIAGNOSIS — W57XXXA Bitten or stung by nonvenomous insect and other nonvenomous arthropods, initial encounter: Secondary | ICD-10-CM

## 2013-05-02 DIAGNOSIS — Z8739 Personal history of other diseases of the musculoskeletal system and connective tissue: Secondary | ICD-10-CM | POA: Insufficient documentation

## 2013-05-02 DIAGNOSIS — Y9389 Activity, other specified: Secondary | ICD-10-CM | POA: Insufficient documentation

## 2013-05-02 DIAGNOSIS — Z7982 Long term (current) use of aspirin: Secondary | ICD-10-CM | POA: Insufficient documentation

## 2013-05-02 DIAGNOSIS — Z880 Allergy status to penicillin: Secondary | ICD-10-CM | POA: Insufficient documentation

## 2013-05-02 DIAGNOSIS — S2095XA Superficial foreign body of unspecified parts of thorax, initial encounter: Secondary | ICD-10-CM | POA: Insufficient documentation

## 2013-05-02 DIAGNOSIS — E7800 Pure hypercholesterolemia, unspecified: Secondary | ICD-10-CM | POA: Insufficient documentation

## 2013-05-02 DIAGNOSIS — Y9289 Other specified places as the place of occurrence of the external cause: Secondary | ICD-10-CM | POA: Insufficient documentation

## 2013-05-02 DIAGNOSIS — G43909 Migraine, unspecified, not intractable, without status migrainosus: Secondary | ICD-10-CM | POA: Insufficient documentation

## 2013-05-02 DIAGNOSIS — F17200 Nicotine dependence, unspecified, uncomplicated: Secondary | ICD-10-CM | POA: Insufficient documentation

## 2013-05-02 DIAGNOSIS — Z79899 Other long term (current) drug therapy: Secondary | ICD-10-CM | POA: Insufficient documentation

## 2013-05-02 DIAGNOSIS — Z8679 Personal history of other diseases of the circulatory system: Secondary | ICD-10-CM | POA: Insufficient documentation

## 2013-05-02 DIAGNOSIS — M549 Dorsalgia, unspecified: Secondary | ICD-10-CM

## 2013-05-02 DIAGNOSIS — Z8673 Personal history of transient ischemic attack (TIA), and cerebral infarction without residual deficits: Secondary | ICD-10-CM | POA: Insufficient documentation

## 2013-05-02 MED ORDER — CYCLOBENZAPRINE HCL 10 MG PO TABS: 10.0000 mg | ORAL_TABLET | Freq: Two times a day (BID) | ORAL | Status: DC | PRN

## 2013-05-02 MED ORDER — HYDROCODONE-ACETAMINOPHEN 5-325 MG PO TABS: 1.0000 | ORAL_TABLET | Freq: Four times a day (QID) | ORAL | Status: DC | PRN

## 2013-05-02 NOTE — ED Provider Notes (Signed)
Medical screening examination/treatment/procedure(s) were performed by non-physician practitioner and as supervising physician I was immediately available for consultation/collaboration.   Shaliyah Taite, MD 05/02/13 2342 

## 2013-05-02 NOTE — ED Notes (Signed)
Pt states that she has had back pain radiating to the LLQ of abd for the past few days.  Also had a tick bite about 1 week ago, L upper back, states that she is concerned the head of the tick is not removed.  Pt would like that assessed also.  Also c/o migraine, no relief from headache.  No n/v/d/c, no urinary sx noted.

## 2013-05-02 NOTE — ED Provider Notes (Signed)
History     CSN: [id number]  Arrival date & time 05/02/13  1624   First MD Initiated Contact with Patient 05/02/13 1652      Chief Complaint  Patient presents with  . Back Pain  . Insect Bite    (Consider location/radiation/quality/duration/timing/severity/associated sxs/prior treatment) HPI Comments: Patient presents emergency department with chief complaint of chronic back pain. She states the pain is located on the left side of her low back, radiates to her left leg. Denies any bowel or bladder incontinence, or saddle anesthesia. She states it is worse with walking. She states that she has a history of the same in the past. She is also complaining of a recent tick bite, approximately one week ago. She denies fevers, muscle aches, or joint pain. She is tried taking Tylenol for her back pain, with no relief. Nothing makes her symptoms better or worse.  The history is provided by the patient. No language interpreter was used.    Past Medical History  Diagnosis Date  . Migraine   . High cholesterol   . Cardiac arrhythmia   . Stroke   . Scoliosis     Past Surgical History  Procedure Laterality Date  . Abdominal hysterectomy    . Rotator cuff repair    . Bunionectomy      History reviewed. No pertinent family history.  History  Substance Use Topics  . Smoking status: Current Every Day Smoker -- 0.50 packs/day for 25 years    Types: Cigarettes  . Smokeless tobacco: Never Used  . Alcohol Use: No    OB History   Grav Para Term Preterm Abortions TAB SAB Ect Mult Living                  Review of Systems  All other systems reviewed and are negative.    Allergies  Ciprofloxacin; Penicillins; Aspirin; Darvon; Ibuprofen; Imitrex; Monosodium glutamate; Nitrates, organic; Nsaids; and Sulfa antibiotics  Home Medications   Current Outpatient Rx  Name  Route  Sig  Dispense  Refill  . acetaminophen (TYLENOL) 500 MG tablet   Oral   Take 1,000-1,500 mg by mouth every 6  (six) hours as needed. For back pain         . aspirin 81 MG tablet   Oral   Take 81 mg by mouth daily.         . calcium elemental as carbonate (TUMS ULTRA 1000) 400 MG tablet   Oral   Chew 1,000 mg by mouth daily.         . cholecalciferol (VITAMIN D) 1000 UNITS tablet   Oral   Take 2,000 Units by mouth 2 (two) times daily.          Marland Kitchen estradiol (VIVELLE-DOT) 0.025 MG/24HR   Transdermal   Place 1 patch onto the skin 2 (two) times a week.         . metoprolol tartrate (LOPRESSOR) 25 MG tablet   Oral   Take 12.5 mg by mouth 2 (two) times daily.         . cyclobenzaprine (FLEXERIL) 10 MG tablet   Oral   Take 1 tablet (10 mg total) by mouth 2 (two) times daily as needed for muscle spasms.   20 tablet   0   . cyclobenzaprine (FLEXERIL) 5 MG tablet   Oral   Take 5 mg by mouth 3 (three) times daily as needed. For pain and sleep         .  escitalopram (LEXAPRO) 10 MG tablet   Oral   Take 10 mg by mouth daily.         Marland Kitchen escitalopram (LEXAPRO) 20 MG tablet   Oral   Take 20 mg by mouth daily.         Marland Kitchen estradiol (VIVELLE-DOT) 0.1 MG/24HR   Transdermal   Place 1 patch onto the skin 2 (two) times a week.         Marland Kitchen HYDROcodone-acetaminophen (NORCO/VICODIN) 5-325 MG per tablet   Oral   Take 1 tablet by mouth every 6 (six) hours as needed for pain.   13 tablet   0   . simvastatin (ZOCOR) 10 MG tablet   Oral   Take 10 mg by mouth at bedtime.         . simvastatin (ZOCOR) 20 MG tablet   Oral   Take 20 mg by mouth every evening.           BP 97/45  Pulse 76  Temp(Src) 97.9 F (36.6 C) (Oral)  Resp 20  Ht 5\' 5"  (1.651 m)  Wt 117 lb 8.1 oz (53.3 kg)  BMI 19.55 kg/m2  SpO2 95%  Physical Exam  Nursing note and vitals reviewed. Constitutional: She is oriented to person, place, and time. She appears well-developed and well-nourished. No distress.  HENT:  Head: Normocephalic and atraumatic.  Eyes: Conjunctivae and EOM are normal. Pupils are  equal, round, and reactive to light. Right eye exhibits no discharge. Left eye exhibits no discharge. No scleral icterus.  Neck: Normal range of motion. Neck supple. No tracheal deviation present.  Cardiovascular: Normal rate, regular rhythm and normal heart sounds.  Exam reveals no gallop and no friction rub.   No murmur heard. Pulmonary/Chest: Effort normal and breath sounds normal. No respiratory distress. She has no wheezes. She has no rales. She exhibits no tenderness.  Abdominal: Soft. Bowel sounds are normal. She exhibits no distension and no mass. There is no tenderness. There is no rebound and no guarding.  Musculoskeletal: Normal range of motion. She exhibits no edema and no tenderness.  Lumbar paraspinal muscles tender to palpation, no bony tenderness, step-offs, or gross abnormality or deformity of spine, patient is able to ambulate, moves all extremities  Neurological: She is alert and oriented to person, place, and time.  Sensation and strength intact bilaterally  Skin: Skin is warm and dry. She is not diaphoretic.  Tick bite to left upper back  Psychiatric: She has a normal mood and affect. Her behavior is normal. Judgment and thought content normal.    ED Course  FOREIGN BODY REMOVAL Date/Time: 05/02/2013 5:29 PM Performed by: Roxy Horseman Authorized by: Roxy Horseman Consent: Verbal consent obtained. Risks and benefits: risks, benefits and alternatives were discussed Consent given by: patient Patient understanding: patient states understanding of the procedure being performed Patient consent: the patient's understanding of the procedure matches consent given Procedure consent: procedure consent matches procedure scheduled Relevant documents: relevant documents present and verified Test results: test results available and properly labeled Imaging studies: imaging studies available Required items: required blood products, implants, devices, and special equipment  available Patient identity confirmed: verbally with patient Body area: skin General location: trunk Location details: back Patient sedated: no Patient restrained: no Patient cooperative: yes Localization method: visualized Removal mechanism: hemostat Tendon involvement: none Depth: subcutaneous Complexity: simple 1 objects recovered. Objects recovered: Insect Post-procedure assessment: foreign body removed Patient tolerance: Patient tolerated the procedure well with no immediate complications.   (including critical  care time)  Labs Reviewed  ROCKY MTN SPOTTED FVR AB, IGG-BLOOD  ROCKY MTN SPOTTED FVR AB, IGM-BLOOD  B. BURGDORFI ANTIBODIES   No results found.   1. Tick bite   2. Back pain       MDM  Patient with back pain.  No neurological deficits and normal neuro exam.  Patient can walk but states is painful.  No loss of bowel or bladder control.  No concern for cauda equina.  No fever, night sweats, weight loss, h/o cancer, IVDU.  RICE protocol and pain medicine indicated and discussed with patient.   Patient also has tick bite, will draw [facility] and [facility] spotty fever titers. Suspicion for disease is low. Will not treat at this time, but have given the patient instructions to followup regarding lab results.  Discussed with Dr. Fredderick Phenix, who agrees with the plan.        Roxy Horseman, PA-C 05/02/13 1731

## 2013-05-04 LAB — ROCKY MTN SPOTTED FVR AB, IGM-BLOOD: RMSF IgM: 0.11 IV (ref 0.00–0.89)

## 2013-05-04 LAB — B. BURGDORFI ANTIBODIES: B burgdorferi Ab IgG+IgM: 0.27 {ISR}

## 2013-05-04 LAB — ROCKY MTN SPOTTED FVR AB, IGG-BLOOD: RMSF IgG: 0.19 IV

## 2013-07-25 ENCOUNTER — Ambulatory Visit (INDEPENDENT_AMBULATORY_CARE_PROVIDER_SITE_OTHER): Payer: Medicare Other | Admitting: Emergency Medicine

## 2013-07-25 VITALS — BP 138/74 | HR 109 | Temp 97.8°F | Resp 16 | Ht 62.0 in | Wt 112.0 lb

## 2013-07-25 DIAGNOSIS — M5432 Sciatica, left side: Secondary | ICD-10-CM

## 2013-07-25 DIAGNOSIS — M5430 Sciatica, unspecified side: Secondary | ICD-10-CM

## 2013-07-25 DIAGNOSIS — M4120 Other idiopathic scoliosis, site unspecified: Secondary | ICD-10-CM

## 2013-07-25 MED ORDER — FLUTICASONE PROPIONATE 50 MCG/ACT NA SUSP: 2.0000 | Freq: Every day | NASAL | Status: AC

## 2013-07-25 MED ORDER — ESCITALOPRAM OXALATE 10 MG PO TABS: 10.0000 mg | ORAL_TABLET | Freq: Every day | ORAL | Status: DC

## 2013-07-25 MED ORDER — TRAMADOL HCL 50 MG PO TABS: 50.0000 mg | ORAL_TABLET | Freq: Four times a day (QID) | ORAL | Status: DC | PRN

## 2013-07-25 MED ORDER — SIMVASTATIN 20 MG PO TABS: 20.0000 mg | ORAL_TABLET | Freq: Every evening | ORAL | Status: DC

## 2013-07-25 MED ORDER — ESTRADIOL 0.1 MG/24HR TD PTTW: 1.0000 | MEDICATED_PATCH | TRANSDERMAL | Status: DC

## 2013-07-25 MED ORDER — METOPROLOL TARTRATE 25 MG PO TABS: 12.5000 mg | ORAL_TABLET | Freq: Two times a day (BID) | ORAL | Status: DC

## 2013-07-25 NOTE — Progress Notes (Signed)
[facility] [address] [place] [address] [phone]  Date:  07/25/2013   Name:  Caydence Koenig   DOB:  May 15, 1950   MRN:  [id number]  PCP:  No PCP Per Patient    Chief Complaint: Hip Pain and Medication Refill   History of Present Illness:  Marie Lyons is a 63 y.o. very pleasant female patient who presents with the following:  Long history of pain in her low back and hip requiring narcotics.  Says her doctor retired a year ago and she had refills until January.  Moved from [place] and has no lcal doctor as yet.  Has appt with a physician with [facility] in September.  Now that she has medicare she is interested in initiating a work up of her sciatic neuritis.  She has pain in her left hip into her lateral foot with paresthesias of the foot.  No weakness.  No history of injury or overuse.  Denies other complaint or health concern today.   There are no active problems to display for this patient.   Past Medical History  Diagnosis Date  . Migraine   . High cholesterol   . Cardiac arrhythmia   . Stroke   . Scoliosis     Past Surgical History  Procedure Laterality Date  . Abdominal hysterectomy    . Rotator cuff repair    . Bunionectomy      History  Substance Use Topics  . Smoking status: Current Every Day Smoker -- 0.50 packs/day for 25 years    Types: Cigarettes  . Smokeless tobacco: Never Used  . Alcohol Use: No    No family history on file.  Allergies  Allergen Reactions  . Ciprofloxacin Shortness Of Breath  . Penicillins Anaphylaxis  . Aspirin Other (See Comments)    Aggravates ulcer  . Darvon (Propoxyphene Hcl) Other (See Comments)    Wheezing.  . Ibuprofen Other (See Comments)    Wheezing.  . Imitrex (Sumatriptan) Other (See Comments)    Contraindicated because pt had a stroke  . Monosodium Glutamate Other (See Comments)    migraine  . Nitrates, Organic Other (See Comments)    migraine  . Nsaids Other (See Comments)   Aggravates ulcer  . Sulfa Antibiotics Other (See Comments)    unknown    Medication list has been reviewed and updated.  Current Outpatient Prescriptions on File Prior to Visit  Medication Sig Dispense Refill  . aspirin 81 MG tablet Take 81 mg by mouth daily.      . calcium elemental as carbonate (TUMS ULTRA 1000) 400 MG tablet Chew 1,000 mg by mouth daily.      . cholecalciferol (VITAMIN D) 1000 UNITS tablet Take 2,000 Units by mouth 2 (two) times daily.       Marland Kitchen estradiol (VIVELLE-DOT) 0.025 MG/24HR Place 1 patch onto the skin 2 (two) times a week.      . estradiol (VIVELLE-DOT) 0.1 MG/24HR Place 1 patch onto the skin 2 (two) times a week.      . metoprolol tartrate (LOPRESSOR) 25 MG tablet Take 12.5 mg by mouth 2 (two) times daily.      . cyclobenzaprine (FLEXERIL) 10 MG tablet Take 1 tablet (10 mg total) by mouth 2 (two) times daily as needed for muscle spasms.  20 tablet  0  . cyclobenzaprine (FLEXERIL) 5 MG tablet Take 5 mg by mouth 3 (three) times daily as needed. For pain and sleep      .  escitalopram (LEXAPRO) 10 MG tablet Take 10 mg by mouth daily.      Marland Kitchen escitalopram (LEXAPRO) 20 MG tablet Take 20 mg by mouth daily.      Marland Kitchen HYDROcodone-acetaminophen (NORCO/VICODIN) 5-325 MG per tablet Take 1 tablet by mouth every 6 (six) hours as needed for pain.  13 tablet  0  . simvastatin (ZOCOR) 10 MG tablet Take 10 mg by mouth at bedtime.      . simvastatin (ZOCOR) 20 MG tablet Take 20 mg by mouth every evening.       No current facility-administered medications on file prior to visit.    Review of Systems:  As per HPI, otherwise negative.    Physical Examination: Filed Vitals:   07/25/13 1702  BP: 138/74  Pulse: 109  Temp: 97.8 F (36.6 C)  Resp: 16   Filed Vitals:   07/25/13 1702  Height: 5\' 2"  (1.575 m)  Weight: 112 lb (50.803 kg)   Body mass index is 20.48 kg/(m^2). Ideal Body Weight: Weight in (lb) to have BMI = 25: 136.4   GEN: WDWN, NAD, Non-toxic, Alert &  Oriented x 3 HEENT: Atraumatic, Normocephalic.  Ears and Nose: No external deformity. EXTR: No clubbing/cyanosis/edema NEURO: Normal gait.  PSYCH: Normally interactive. Conversant. Not depressed or anxious appearing.  Calm demeanor.  BACK scoliosis.  Tenderness paraspinous muscles on left.  Motor and tendon exam on lowers normal and symmetrical.  Assessment and Plan: Sciatic neuritis Scoliosis MRI Follow up after MRI   Signed,  Phillips Odor, MD

## 2013-07-25 NOTE — Patient Instructions (Addendum)
Sciatica °Sciatica is pain, weakness, numbness, or tingling along the path of the sciatic nerve. The nerve starts in the lower back and runs down the back of each leg. The nerve controls the muscles in the lower leg and in the back of the knee, while also providing sensation to the back of the thigh, lower leg, and the sole of your foot. Sciatica is a symptom of another medical condition. For instance, nerve damage or certain conditions, such as a herniated disk or bone spur on the spine, pinch or put pressure on the sciatic nerve. This causes the pain, weakness, or other sensations normally associated with sciatica. Generally, sciatica only affects one side of the body. °CAUSES  °· Herniated or slipped disc. °· Degenerative disk disease. °· A pain disorder involving the narrow muscle in the buttocks (piriformis syndrome). °· Pelvic injury or fracture. °· Pregnancy. °· Tumor (rare). °SYMPTOMS  °Symptoms can vary from mild to very severe. The symptoms usually travel from the low back to the buttocks and down the back of the leg. Symptoms can include: °· Mild tingling or dull aches in the lower back, leg, or hip. °· Numbness in the back of the calf or sole of the foot. °· Burning sensations in the lower back, leg, or hip. °· Sharp pains in the lower back, leg, or hip. °· Leg weakness. °· Severe back pain inhibiting movement. °These symptoms may get worse with coughing, sneezing, laughing, or prolonged sitting or standing. Also, being overweight may worsen symptoms. °DIAGNOSIS  °Your caregiver will perform a physical exam to look for common symptoms of sciatica. He or she may ask you to do certain movements or activities that would trigger sciatic nerve pain. Other tests may be performed to find the cause of the sciatica. These may include: °· Blood tests. °· X-rays. °· Imaging tests, such as an MRI or CT scan. °TREATMENT  °Treatment is directed at the cause of the sciatic pain. Sometimes, treatment is not necessary  and the pain and discomfort goes away on its own. If treatment is needed, your caregiver may suggest: °· Over-the-counter medicines to relieve pain. °· Prescription medicines, such as anti-inflammatory medicine, muscle relaxants, or narcotics. °· Applying heat or ice to the painful area. °· Steroid injections to lessen pain, irritation, and inflammation around the nerve. °· Reducing activity during periods of pain. °· Exercising and stretching to strengthen your abdomen and improve flexibility of your spine. Your caregiver may suggest losing weight if the extra weight makes the back pain worse. °· Physical therapy. °· Surgery to eliminate what is pressing or pinching the nerve, such as a bone spur or part of a herniated disk. °HOME CARE INSTRUCTIONS  °· Only take over-the-counter or prescription medicines for pain or discomfort as directed by your caregiver. °· Apply ice to the affected area for 20 minutes, 3 4 times a day for the first 48 72 hours. Then try heat in the same way. °· Exercise, stretch, or perform your usual activities if these do not aggravate your pain. °· Attend physical therapy sessions as directed by your caregiver. °· Keep all follow-up appointments as directed by your caregiver. °· Do not wear high heels or shoes that do not provide proper support. °· Check your mattress to see if it is too soft. A firm mattress may lessen your pain and discomfort. °SEEK IMMEDIATE MEDICAL CARE IF:  °· You lose control of your bowel or bladder (incontinence). °· You have increasing weakness in the lower back,   pelvis, buttocks, or legs. °· You have redness or swelling of your back. °· You have a burning sensation when you urinate. °· You have pain that gets worse when you lie down or awakens you at night. °· Your pain is worse than you have experienced in the past. °· Your pain is lasting longer than 4 weeks. °· You are suddenly losing weight without reason. °MAKE SURE YOU: °· Understand these  instructions. °· Will watch your condition. °· Will get help right away if you are not doing well or get worse. °Document Released: 12/11/2001 Document Revised: 06/17/2012 Document Reviewed: 04/27/2012 °[employer]® Patient Information ©2014 [employer]. ° °

## 2013-08-05 ENCOUNTER — Telehealth: Payer: Self-pay | Admitting: Radiology

## 2013-08-05 NOTE — Telephone Encounter (Signed)
MRI scan Lumbar authorized [id number] valid until 08/26/2013

## 2013-08-25 DIAGNOSIS — R300 Dysuria: Secondary | ICD-10-CM | POA: Diagnosis not present

## 2013-08-26 ENCOUNTER — Other Ambulatory Visit: Payer: Self-pay | Admitting: Emergency Medicine

## 2013-08-26 NOTE — Telephone Encounter (Signed)
Please inquire as to why the ordered MRI has not yet been accomplished?  She missed having one in 2103 as well.

## 2013-09-03 ENCOUNTER — Ambulatory Visit
Admission: RE | Admit: 2013-09-03 | Discharge: 2013-09-03 | Disposition: A | Discharge status: Home / Self Care | Payer: Medicare Other | Source: Ambulatory Visit | Attending: Emergency Medicine | Admitting: Emergency Medicine

## 2013-09-03 DIAGNOSIS — M5126 Other intervertebral disc displacement, lumbar region: Secondary | ICD-10-CM | POA: Diagnosis not present

## 2013-09-03 DIAGNOSIS — M4120 Other idiopathic scoliosis, site unspecified: Secondary | ICD-10-CM

## 2013-09-03 DIAGNOSIS — M47817 Spondylosis without myelopathy or radiculopathy, lumbosacral region: Secondary | ICD-10-CM | POA: Diagnosis not present

## 2013-09-03 DIAGNOSIS — M5432 Sciatica, left side: Secondary | ICD-10-CM

## 2013-09-03 NOTE — Telephone Encounter (Signed)
Will you check on the MRI?

## 2013-09-05 ENCOUNTER — Telehealth: Payer: Self-pay

## 2013-09-05 NOTE — Telephone Encounter (Signed)
Gave pt results and she reports that she is still having same Sxs, esp bothers her at night and can't get comfortable to sleep. Advised pt the RTC per Dr Ewell Poe instr's and gave her his hours next week. Pt agreed.

## 2013-09-05 NOTE — Telephone Encounter (Signed)
Pt is returning our call for mri results

## 2013-09-27 ENCOUNTER — Other Ambulatory Visit: Payer: Self-pay | Admitting: Emergency Medicine

## 2013-10-28 DIAGNOSIS — IMO0002 Reserved for concepts with insufficient information to code with codable children: Secondary | ICD-10-CM | POA: Diagnosis not present

## 2013-10-28 DIAGNOSIS — J309 Allergic rhinitis, unspecified: Secondary | ICD-10-CM | POA: Diagnosis not present

## 2013-10-28 DIAGNOSIS — Z0000 Encounter for general adult medical examination without abnormal findings: Secondary | ICD-10-CM | POA: Diagnosis not present

## 2013-10-28 DIAGNOSIS — F17200 Nicotine dependence, unspecified, uncomplicated: Secondary | ICD-10-CM | POA: Diagnosis not present

## 2013-10-28 DIAGNOSIS — I10 Essential (primary) hypertension: Secondary | ICD-10-CM | POA: Diagnosis not present

## 2013-10-28 DIAGNOSIS — M545 Low back pain, unspecified: Secondary | ICD-10-CM | POA: Diagnosis not present

## 2013-10-28 DIAGNOSIS — N951 Menopausal and female climacteric states: Secondary | ICD-10-CM | POA: Diagnosis not present

## 2013-10-28 DIAGNOSIS — E785 Hyperlipidemia, unspecified: Secondary | ICD-10-CM | POA: Diagnosis not present

## 2013-10-29 DIAGNOSIS — IMO0002 Reserved for concepts with insufficient information to code with codable children: Secondary | ICD-10-CM | POA: Diagnosis not present

## 2014-01-05 ENCOUNTER — Emergency Department (HOSPITAL_BASED_OUTPATIENT_CLINIC_OR_DEPARTMENT_OTHER)
Admission: EM | Admit: 2014-01-05 | Discharge: 2014-01-06 | Disposition: A | Discharge status: Home / Self Care | Payer: Medicare Other | Attending: Emergency Medicine | Admitting: Emergency Medicine

## 2014-01-05 ENCOUNTER — Emergency Department (HOSPITAL_BASED_OUTPATIENT_CLINIC_OR_DEPARTMENT_OTHER): Payer: Medicare Other

## 2014-01-05 ENCOUNTER — Encounter (HOSPITAL_BASED_OUTPATIENT_CLINIC_OR_DEPARTMENT_OTHER): Payer: Self-pay | Admitting: Emergency Medicine

## 2014-01-05 DIAGNOSIS — Z8739 Personal history of other diseases of the musculoskeletal system and connective tissue: Secondary | ICD-10-CM | POA: Insufficient documentation

## 2014-01-05 DIAGNOSIS — Y9301 Activity, walking, marching and hiking: Secondary | ICD-10-CM | POA: Insufficient documentation

## 2014-01-05 DIAGNOSIS — E7800 Pure hypercholesterolemia, unspecified: Secondary | ICD-10-CM | POA: Insufficient documentation

## 2014-01-05 DIAGNOSIS — M79609 Pain in unspecified limb: Secondary | ICD-10-CM | POA: Diagnosis not present

## 2014-01-05 DIAGNOSIS — Z7982 Long term (current) use of aspirin: Secondary | ICD-10-CM | POA: Diagnosis not present

## 2014-01-05 DIAGNOSIS — Z880 Allergy status to penicillin: Secondary | ICD-10-CM | POA: Diagnosis not present

## 2014-01-05 DIAGNOSIS — Z9889 Other specified postprocedural states: Secondary | ICD-10-CM | POA: Diagnosis not present

## 2014-01-05 DIAGNOSIS — Z8679 Personal history of other diseases of the circulatory system: Secondary | ICD-10-CM | POA: Insufficient documentation

## 2014-01-05 DIAGNOSIS — S40019A Contusion of unspecified shoulder, initial encounter: Secondary | ICD-10-CM | POA: Diagnosis not present

## 2014-01-05 DIAGNOSIS — S46909A Unspecified injury of unspecified muscle, fascia and tendon at shoulder and upper arm level, unspecified arm, initial encounter: Secondary | ICD-10-CM | POA: Diagnosis not present

## 2014-01-05 DIAGNOSIS — Z8673 Personal history of transient ischemic attack (TIA), and cerebral infarction without residual deficits: Secondary | ICD-10-CM | POA: Insufficient documentation

## 2014-01-05 DIAGNOSIS — IMO0002 Reserved for concepts with insufficient information to code with codable children: Secondary | ICD-10-CM | POA: Insufficient documentation

## 2014-01-05 DIAGNOSIS — F17200 Nicotine dependence, unspecified, uncomplicated: Secondary | ICD-10-CM | POA: Insufficient documentation

## 2014-01-05 DIAGNOSIS — W1809XA Striking against other object with subsequent fall, initial encounter: Secondary | ICD-10-CM | POA: Insufficient documentation

## 2014-01-05 DIAGNOSIS — Y929 Unspecified place or not applicable: Secondary | ICD-10-CM | POA: Insufficient documentation

## 2014-01-05 DIAGNOSIS — Z79899 Other long term (current) drug therapy: Secondary | ICD-10-CM | POA: Diagnosis not present

## 2014-01-05 DIAGNOSIS — S4980XA Other specified injuries of shoulder and upper arm, unspecified arm, initial encounter: Secondary | ICD-10-CM | POA: Diagnosis not present

## 2014-01-05 DIAGNOSIS — S40011A Contusion of right shoulder, initial encounter: Secondary | ICD-10-CM

## 2014-01-05 MED ORDER — HYDROCODONE-ACETAMINOPHEN 5-325 MG PO TABS: 2.0000 | ORAL_TABLET | ORAL | Status: DC | PRN

## 2014-01-05 NOTE — ED Provider Notes (Signed)
CSN: [id number]     Arrival date & time 01/05/14  2118 History   First MD Initiated Contact with Patient 01/05/14 2341     This chart was scribed for Geoffery Lyons, MD by Arlan Organ, ED Scribe. This patient was seen in room MH04/MH04 and the patient's care was started 11:47 PM.   Chief Complaint  Patient presents with  . Fall  . Shoulder Pain   HPI  HPI Comments: Marie Lyons is a 64 y.o. female who presents to the Emergency Department complaining of a fall that occurred today prior to arrival. Pt states she was walking up some steps, stepped onto a platform, and tripped landing directly on her shoulder. She now c/o of gradual onset, gradually worsening, constant, moderate right shoulder pain. She states she is able to ambulate without difficulty. She reports an injury to her left shoulder a number of year ago. Pt has no other complaints at this time.  Past Medical History  Diagnosis Date  . Migraine   . High cholesterol   . Cardiac arrhythmia   . Stroke   . Scoliosis    Past Surgical History  Procedure Laterality Date  . Abdominal hysterectomy    . Rotator cuff repair    . Bunionectomy     No family history on file. History  Substance Use Topics  . Smoking status: Current Every Day Smoker -- 0.50 packs/day for 25 years    Types: Cigarettes  . Smokeless tobacco: Never Used  . Alcohol Use: No   OB History   Grav Para Term Preterm Abortions TAB SAB Ect Mult Living                 Review of Systems  Constitutional: Negative for fever and chills.  Musculoskeletal: Positive for arthralgias.  All other systems reviewed and are negative.    Allergies  Ciprofloxacin; Penicillins; Aspirin; Darvon; Ibuprofen; Imitrex; Monosodium glutamate; Nitrates, organic; Nsaids; and Sulfa antibiotics  Home Medications   Current Outpatient Rx  Name  Route  Sig  Dispense  Refill  . aspirin 81 MG tablet   Oral   Take 81 mg by mouth daily.         . calcium elemental as carbonate  (TUMS ULTRA 1000) 400 MG tablet   Oral   Chew 1,000 mg by mouth daily.         . cholecalciferol (VITAMIN D) 1000 UNITS tablet   Oral   Take 2,000 Units by mouth 2 (two) times daily.          . cyclobenzaprine (FLEXERIL) 10 MG tablet   Oral   Take 1 tablet (10 mg total) by mouth 2 (two) times daily as needed for muscle spasms.   20 tablet   0   . cyclobenzaprine (FLEXERIL) 5 MG tablet   Oral   Take 5 mg by mouth 3 (three) times daily as needed. For pain and sleep         . escitalopram (LEXAPRO) 10 MG tablet      TAKE 1 TABLET BY MOUTH DAILY   30 tablet   0   . escitalopram (LEXAPRO) 20 MG tablet   Oral   Take 20 mg by mouth daily.         Marland Kitchen EXPIRED: estradiol (VIVELLE-DOT) 0.025 MG/24HR   Transdermal   Place 1 patch onto the skin 2 (two) times a week.         . estradiol (VIVELLE-DOT) 0.1 MG/24HR   Transdermal  Place 1 patch (0.1 mg total) onto the skin 2 (two) times a week.   8 patch   1   . fluticasone (FLONASE) 50 MCG/ACT nasal spray   Nasal   Place 2 sprays into the nose daily.   16 g   12   . HYDROcodone-acetaminophen (NORCO/VICODIN) 5-325 MG per tablet   Oral   Take 1 tablet by mouth every 6 (six) hours as needed for pain.   13 tablet   0   . metoprolol tartrate (LOPRESSOR) 25 MG tablet      TAKE 1/2 TABLET BY MOUTH TWICE DAILY   30 tablet   0   . simvastatin (ZOCOR) 10 MG tablet   Oral   Take 10 mg by mouth at bedtime.         . simvastatin (ZOCOR) 20 MG tablet      TAKE 1 TABLET BY MOUTH EVERY EVENING   30 tablet   0   . traMADol (ULTRAM) 50 MG tablet      TAKE ONE TABLET BY MOUTH EVERY 6 HOURS AS NEEDED FOR PAIN   30 tablet   0    BP 118/68  Pulse 81  Temp(Src) 97.7 F (36.5 C) (Oral)  Resp 20  Ht 5\' 4"  (1.626 m)  Wt 107 lb (48.535 kg)  BMI 18.36 kg/m2  SpO2 99%  Physical Exam  Nursing note and vitals reviewed. Constitutional: She is oriented to person, place, and time. She appears well-developed and  well-nourished.  HENT:  Head: Normocephalic.  Mouth/Throat: Oropharynx is clear and moist.  Eyes: EOM are normal.  Neck: Normal range of motion.  Cardiovascular: Normal rate, regular rhythm and normal heart sounds.   Pulmonary/Chest: Effort normal.  Abdominal: She exhibits no distension. There is no tenderness. There is no rebound and no guarding.  Musculoskeletal: Normal range of motion. She exhibits tenderness. She exhibits no edema.  Tenderness to palpation over alteral aspect of right shoulder. Appears grossly normal with no deformity. Pain with ROM. Ulnar and radial pulses easily palpable. Able to move all fingers. Sensation intact.  Neurological: She is alert and oriented to person, place, and time.  Skin: Skin is warm and dry.  Psychiatric: She has a normal mood and affect.    ED Course  Procedures (including critical care time)  DIAGNOSTIC STUDIES: Oxygen Saturation is 99% on RA, Normal by my interpretation.    COORDINATION OF CARE: 11:51 PM- Will order X-Rays. Discussed treatment plan with pt at bedside and pt agreed to plan.     Labs Review Labs Reviewed - No data to display Imaging Review Dg Humerus Right  01/05/2014   CLINICAL DATA:  Pain post trauma  EXAM: RIGHT HUMERUS - 2+ VIEW  COMPARISON:  None.  FINDINGS: Frontal and lateral views were obtained. No fracture or dislocation. Joint spaces appear intact. No abnormal periosteal reaction.  IMPRESSION: No abnormality noted.   Electronically Signed   By: Lowella Grip M.D.   On: 01/05/2014 22:04      MDM  No diagnosis found. X-rays reveal no fracture and no dislocation. This sounds like a contusion or sprain. The distal extremity is neurovascularly intact and no further workup indicated at this time. She was placed in a sling given some anti-inflammatories and pain medication. She is to rest. If not improving in the next week she is to followup with her primary Dr.   I personally performed the services  described in this documentation, which was scribed in my presence. The  recorded information has been reviewed and is accurate.      Veryl Speak, MD 01/06/14 [phone]

## 2014-01-05 NOTE — ED Notes (Signed)
Lost her balance and fell. Injury to her right upper arm and shoulder.

## 2014-01-05 NOTE — ED Notes (Addendum)
Fell around 1900 pm,  lost balance,  C/o rt shoulder and arm pain,  No deformity noted   Pain increased w movement

## 2014-01-05 NOTE — Discharge Instructions (Signed)
Wear sling for the next week.  Hydrocodone as prescribed as needed for pain.  Ice for 20 minutes every 2 hours while awake for the next 2 days.  If your symptoms are not improving in the next week, followup with your primary Dr. to be reevaluated.   Contusion A contusion is a deep bruise. Contusions are the result of an injury that caused bleeding under the skin. The contusion may turn blue, purple, or yellow. Minor injuries will give you a painless contusion, but more severe contusions may stay painful and swollen for a few weeks.  CAUSES  A contusion is usually caused by a blow, trauma, or direct force to an area of the body. SYMPTOMS   Swelling and redness of the injured area.  Bruising of the injured area.  Tenderness and soreness of the injured area.  Pain. DIAGNOSIS  The diagnosis can be made by taking a history and physical exam. An X-ray, CT scan, or MRI may be needed to determine if there were any associated injuries, such as fractures. TREATMENT  Specific treatment will depend on what area of the body was injured. In general, the best treatment for a contusion is resting, icing, elevating, and applying cold compresses to the injured area. Over-the-counter medicines may also be recommended for pain control. Ask your caregiver what the best treatment is for your contusion. HOME CARE INSTRUCTIONS   Put ice on the injured area.  Put ice in a plastic bag.  Place a towel between your skin and the bag.  Leave the ice on for 15-20 minutes, 03-04 times a day.  Only take over-the-counter or prescription medicines for pain, discomfort, or fever as directed by your caregiver. Your caregiver may recommend avoiding anti-inflammatory medicines (aspirin, ibuprofen, and naproxen) for 48 hours because these medicines may increase bruising.  Rest the injured area.  If possible, elevate the injured area to reduce swelling. SEEK IMMEDIATE MEDICAL CARE IF:   You have increased bruising  or swelling.  You have pain that is getting worse.  Your swelling or pain is not relieved with medicines. MAKE SURE YOU:   Understand these instructions.  Will watch your condition.  Will get help right away if you are not doing well or get worse. Document Released: 09/26/2005 Document Revised: 03/10/2012 Document Reviewed: 10/22/2011 [facility] Patient Information 2014 [place], [place].

## 2014-04-27 DIAGNOSIS — N951 Menopausal and female climacteric states: Secondary | ICD-10-CM | POA: Diagnosis not present

## 2014-04-27 DIAGNOSIS — M545 Low back pain, unspecified: Secondary | ICD-10-CM | POA: Diagnosis not present

## 2014-04-27 DIAGNOSIS — I10 Essential (primary) hypertension: Secondary | ICD-10-CM | POA: Diagnosis not present

## 2014-04-27 DIAGNOSIS — G43909 Migraine, unspecified, not intractable, without status migrainosus: Secondary | ICD-10-CM | POA: Diagnosis not present

## 2014-04-27 DIAGNOSIS — Z23 Encounter for immunization: Secondary | ICD-10-CM | POA: Diagnosis not present

## 2014-04-27 DIAGNOSIS — F329 Major depressive disorder, single episode, unspecified: Secondary | ICD-10-CM | POA: Diagnosis not present

## 2014-05-17 DIAGNOSIS — Z780 Asymptomatic menopausal state: Secondary | ICD-10-CM | POA: Diagnosis not present

## 2014-06-28 DIAGNOSIS — R209 Unspecified disturbances of skin sensation: Secondary | ICD-10-CM | POA: Diagnosis not present

## 2014-06-28 DIAGNOSIS — F329 Major depressive disorder, single episode, unspecified: Secondary | ICD-10-CM | POA: Diagnosis not present

## 2014-07-29 ENCOUNTER — Ambulatory Visit (INDEPENDENT_AMBULATORY_CARE_PROVIDER_SITE_OTHER): Payer: Medicare Other | Admitting: Psychology

## 2014-07-29 DIAGNOSIS — F4323 Adjustment disorder with mixed anxiety and depressed mood: Secondary | ICD-10-CM | POA: Diagnosis not present

## 2014-08-10 DIAGNOSIS — W57XXXA Bitten or stung by nonvenomous insect and other nonvenomous arthropods, initial encounter: Secondary | ICD-10-CM | POA: Diagnosis not present

## 2014-08-10 DIAGNOSIS — M545 Low back pain, unspecified: Secondary | ICD-10-CM | POA: Diagnosis not present

## 2014-08-10 DIAGNOSIS — B9789 Other viral agents as the cause of diseases classified elsewhere: Secondary | ICD-10-CM | POA: Diagnosis not present

## 2014-08-10 DIAGNOSIS — IMO0002 Reserved for concepts with insufficient information to code with codable children: Secondary | ICD-10-CM | POA: Diagnosis not present

## 2014-08-10 DIAGNOSIS — M949 Disorder of cartilage, unspecified: Secondary | ICD-10-CM | POA: Diagnosis not present

## 2014-08-10 DIAGNOSIS — T148 Other injury of unspecified body region: Secondary | ICD-10-CM | POA: Diagnosis not present

## 2014-08-10 DIAGNOSIS — M899 Disorder of bone, unspecified: Secondary | ICD-10-CM | POA: Diagnosis not present

## 2014-08-31 ENCOUNTER — Ambulatory Visit (INDEPENDENT_AMBULATORY_CARE_PROVIDER_SITE_OTHER): Payer: Medicare Other | Admitting: Psychology

## 2014-08-31 DIAGNOSIS — F4323 Adjustment disorder with mixed anxiety and depressed mood: Secondary | ICD-10-CM

## 2014-09-07 ENCOUNTER — Ambulatory Visit (INDEPENDENT_AMBULATORY_CARE_PROVIDER_SITE_OTHER): Payer: Medicare Other | Admitting: Psychology

## 2014-09-07 DIAGNOSIS — F4323 Adjustment disorder with mixed anxiety and depressed mood: Secondary | ICD-10-CM | POA: Diagnosis not present

## 2014-09-08 ENCOUNTER — Emergency Department (HOSPITAL_COMMUNITY)
Admission: EM | Admit: 2014-09-08 | Discharge: 2014-09-08 | Disposition: A | Discharge status: Home / Self Care | Payer: Medicare Other | Attending: Emergency Medicine | Admitting: Emergency Medicine

## 2014-09-08 ENCOUNTER — Encounter (HOSPITAL_COMMUNITY): Payer: Self-pay | Admitting: Emergency Medicine

## 2014-09-08 ENCOUNTER — Emergency Department (HOSPITAL_COMMUNITY): Payer: Medicare Other

## 2014-09-08 DIAGNOSIS — S63639A Sprain of interphalangeal joint of unspecified finger, initial encounter: Secondary | ICD-10-CM | POA: Diagnosis not present

## 2014-09-08 DIAGNOSIS — S6390XA Sprain of unspecified part of unspecified wrist and hand, initial encounter: Secondary | ICD-10-CM | POA: Diagnosis not present

## 2014-09-08 DIAGNOSIS — Z79899 Other long term (current) drug therapy: Secondary | ICD-10-CM | POA: Diagnosis not present

## 2014-09-08 DIAGNOSIS — Z7982 Long term (current) use of aspirin: Secondary | ICD-10-CM | POA: Insufficient documentation

## 2014-09-08 DIAGNOSIS — E7800 Pure hypercholesterolemia, unspecified: Secondary | ICD-10-CM | POA: Insufficient documentation

## 2014-09-08 DIAGNOSIS — G43909 Migraine, unspecified, not intractable, without status migrainosus: Secondary | ICD-10-CM | POA: Diagnosis not present

## 2014-09-08 DIAGNOSIS — Z880 Allergy status to penicillin: Secondary | ICD-10-CM | POA: Insufficient documentation

## 2014-09-08 DIAGNOSIS — Y9389 Activity, other specified: Secondary | ICD-10-CM | POA: Insufficient documentation

## 2014-09-08 DIAGNOSIS — Z8673 Personal history of transient ischemic attack (TIA), and cerebral infarction without residual deficits: Secondary | ICD-10-CM | POA: Insufficient documentation

## 2014-09-08 DIAGNOSIS — Y92009 Unspecified place in unspecified non-institutional (private) residence as the place of occurrence of the external cause: Secondary | ICD-10-CM | POA: Diagnosis not present

## 2014-09-08 DIAGNOSIS — X500XXA Overexertion from strenuous movement or load, initial encounter: Secondary | ICD-10-CM | POA: Diagnosis not present

## 2014-09-08 DIAGNOSIS — F17200 Nicotine dependence, unspecified, uncomplicated: Secondary | ICD-10-CM | POA: Diagnosis not present

## 2014-09-08 DIAGNOSIS — M79609 Pain in unspecified limb: Secondary | ICD-10-CM | POA: Insufficient documentation

## 2014-09-08 DIAGNOSIS — S6990XA Unspecified injury of unspecified wrist, hand and finger(s), initial encounter: Secondary | ICD-10-CM | POA: Diagnosis not present

## 2014-09-08 DIAGNOSIS — G8929 Other chronic pain: Secondary | ICD-10-CM | POA: Insufficient documentation

## 2014-09-08 DIAGNOSIS — IMO0002 Reserved for concepts with insufficient information to code with codable children: Secondary | ICD-10-CM | POA: Insufficient documentation

## 2014-09-08 DIAGNOSIS — M19039 Primary osteoarthritis, unspecified wrist: Secondary | ICD-10-CM | POA: Diagnosis not present

## 2014-09-08 DIAGNOSIS — W230XXA Caught, crushed, jammed, or pinched between moving objects, initial encounter: Secondary | ICD-10-CM | POA: Diagnosis not present

## 2014-09-08 DIAGNOSIS — S63601A Unspecified sprain of right thumb, initial encounter: Secondary | ICD-10-CM

## 2014-09-08 DIAGNOSIS — S59909A Unspecified injury of unspecified elbow, initial encounter: Secondary | ICD-10-CM | POA: Diagnosis not present

## 2014-09-08 HISTORY — DX: Other chronic pain: G89.29

## 2014-09-08 HISTORY — DX: Dorsalgia, unspecified: M54.9

## 2014-09-08 MED ORDER — HYDROCODONE-ACETAMINOPHEN 5-325 MG PO TABS: 1.0000 | ORAL_TABLET | Freq: Four times a day (QID) | ORAL | Status: DC | PRN

## 2014-09-08 NOTE — Discharge Instructions (Signed)
Return here as needed.  Ice and elevate the finger and wrist followup with the orthopedist as needed

## 2014-09-08 NOTE — ED Notes (Signed)
Pt called for fast track x one with no response.

## 2014-09-08 NOTE — ED Provider Notes (Signed)
CSN: [id number]     Arrival date & time 09/08/14  1341 History   First MD Initiated Contact with Patient 09/08/14 1444     Chief Complaint  Patient presents with  . Hand Pain     (Consider location/radiation/quality/duration/timing/severity/associated sxs/prior Treatment) HPI Patient presents to the emergency department with right thumb and wrist pain started when she was attempting to unjam a metal door at her house.  Pain has been ongoing for the last 3 days.  Patient, states she used.  Ice and compression, along with Tylenol without any relief of her symptoms.  Patient, states, that movement and palpation make the pain, worse.  Patient, states she feels, like she may have been her thumb backwards, too far. Past Medical History  Diagnosis Date  . Migraine   . High cholesterol   . Cardiac arrhythmia   . Stroke   . Scoliosis   . Chronic back pain    Past Surgical History  Procedure Laterality Date  . Abdominal hysterectomy    . Rotator cuff repair    . Bunionectomy     No family history on file. History  Substance Use Topics  . Smoking status: Current Every Day Smoker -- 0.50 packs/day for 25 years    Types: Cigarettes  . Smokeless tobacco: Never Used  . Alcohol Use: No   OB History   Grav Para Term Preterm Abortions TAB SAB Ect Mult Living                 Review of Systems  All other systems negative except as documented in the HPI. All pertinent positives and negatives as reviewed in the HPI.  Allergies  Ciprofloxacin; Penicillins; Aspirin; Darvon; Ibuprofen; Imitrex; Monosodium glutamate; Nitrates, organic; Nsaids; and Sulfa antibiotics  Home Medications   Prior to Admission medications   Medication Sig Start Date End Date Taking? Authorizing Provider  acetaminophen (TYLENOL) 500 MG tablet Take 1,500 mg by mouth every 6 (six) hours as needed for mild pain.   Yes Historical Provider, MD  aspirin 81 MG tablet Take 81 mg by mouth daily.   Yes Historical Provider, MD   calcium elemental as carbonate (TUMS ULTRA 1000) 400 MG tablet Chew 1,000 mg by mouth daily.   Yes Historical Provider, MD  cholecalciferol (VITAMIN D) 1000 UNITS tablet Take 1,000 Units by mouth daily.    Yes Historical Provider, MD  escitalopram (LEXAPRO) 10 MG tablet Take 10 mg by mouth at bedtime.   Yes Historical Provider, MD  estradiol (VIVELLE-DOT) 0.1 MG/24HR patch Place 1 patch onto the skin every Wednesday.   Yes Historical Provider, MD  fluticasone (FLONASE) 50 MCG/ACT nasal spray Place 2 sprays into the nose daily. 07/25/13  Yes Roselee Culver, MD  metoprolol tartrate (LOPRESSOR) 25 MG tablet Take 25 mg by mouth at bedtime.   Yes Historical Provider, MD  simvastatin (ZOCOR) 20 MG tablet Take 20 mg by mouth at bedtime.   Yes Historical Provider, MD  vitamin C (ASCORBIC ACID) 250 MG tablet Take 250 mg by mouth daily.   Yes Historical Provider, MD   BP 102/48  Pulse 66  Temp(Src) 98.1 F (36.7 C) (Oral)  Resp 15  Ht 5\' 4"  (1.626 m)  Wt 110 lb (49.896 kg)  BMI 18.87 kg/m2  SpO2 97% Physical Exam  Constitutional: She is oriented to person, place, and time. She appears well-developed and well-nourished. No distress.  HENT:  Head: Normocephalic and atraumatic.  Pulmonary/Chest: Effort normal.  Musculoskeletal:  Right hand: She exhibits tenderness. She exhibits normal range of motion, no bony tenderness, normal two-point discrimination, normal capillary refill, no deformity, no laceration and no swelling. Normal sensation noted. Normal strength noted.  Neurological: She is alert and oriented to person, place, and time.    ED Course  Procedures (including critical care time) Labs Review Labs Reviewed - No data to display  Imaging Review Dg Wrist Complete Right  09/08/2014   CLINICAL DATA:  Injured wrist and thumb.  EXAM: RIGHT WRIST - COMPLETE 3+ VIEW  COMPARISON:  None.  FINDINGS: There is severe degenerative change at the carpometacarpal joint of thumb with associated  degenerative subluxation. No acute fracture is identified. There is widening of the scapholunate joint space suggesting ligamentous insufficiency.  IMPRESSION: Advanced degenerative changes at the carpometacarpal joint of the thumb and degenerative subluxation.  Widened scapholunate joint space suggesting scapholunate ligament insufficiency.  No acute bony findings.   Electronically Signed   By: Kalman Jewels M.D.   On: 09/08/2014 15:25   Dg Finger Thumb Right  09/08/2014   CLINICAL DATA:  Radial RIGHT wrist pain, RIGHT thumb pain, onset after using a lot of force trying to open a door  EXAM: RIGHT THUMB 2+V  COMPARISON:  None  FINDINGS: Osseous demineralization.  Degenerative changes at first [facility] joint.  Joint spaces otherwise preserved.  No acute fracture, dislocation or bone destruction.  Soft tissues unremarkable.  IMPRESSION: Advanced degenerative changes at first [facility] joint.   Electronically Signed   By: Lavonia Dana M.D.   On: 09/08/2014 15:24   The patient will be referred to orthopedics.  Told to return here as needed.  Will be placed in a thumb spica Velcro splint.  SPLINT APPLICATION Date/Time: 7:32 PM Authorized by: Brent General Consent: Verbal consent obtained. Risks and benefits: risks, benefits and alternatives were discussed Consent given by: patient Splint applied by: orthopedic technician Location details: Right thumb  Splint type: Thumb spica  Supplies used: Thumb spica splint  Post-procedure: The splinted body part was neurovascularly unchanged following the procedure. Patient tolerance: Patient tolerated the procedure well with no immediate complications.       Brent General, PA-C 09/08/14 1807

## 2014-09-08 NOTE — ED Notes (Addendum)
Patient states she was trying to un-jam a metal door and her  R Thumb "bent backwards"  Patient states pain is 10/10 and states pain radiates up her shoulder. Patient tried ice compression and tyenol and she dd not get any relief. Patient has a bruise to the R thumb.

## 2014-09-08 NOTE — ED Notes (Signed)
Ice pack given

## 2014-09-09 NOTE — ED Provider Notes (Signed)
Medical screening examination/treatment/procedure(s) were performed by non-physician practitioner and as supervising physician I was immediately available for consultation/collaboration.   EKG Interpretation None        Pamella Pert, MD 09/09/14 [phone]

## 2014-09-14 ENCOUNTER — Ambulatory Visit (INDEPENDENT_AMBULATORY_CARE_PROVIDER_SITE_OTHER): Payer: Medicare Other | Admitting: Psychology

## 2014-09-14 DIAGNOSIS — F4323 Adjustment disorder with mixed anxiety and depressed mood: Secondary | ICD-10-CM

## 2014-09-16 DIAGNOSIS — M19049 Primary osteoarthritis, unspecified hand: Secondary | ICD-10-CM | POA: Diagnosis not present

## 2014-09-23 ENCOUNTER — Ambulatory Visit (INDEPENDENT_AMBULATORY_CARE_PROVIDER_SITE_OTHER): Payer: Medicare Other | Admitting: Psychology

## 2014-09-23 DIAGNOSIS — F4323 Adjustment disorder with mixed anxiety and depressed mood: Secondary | ICD-10-CM | POA: Diagnosis not present

## 2014-09-30 ENCOUNTER — Ambulatory Visit (INDEPENDENT_AMBULATORY_CARE_PROVIDER_SITE_OTHER): Payer: Medicare Other | Admitting: Psychology

## 2014-09-30 DIAGNOSIS — F4323 Adjustment disorder with mixed anxiety and depressed mood: Secondary | ICD-10-CM

## 2014-10-05 ENCOUNTER — Ambulatory Visit: Payer: Medicare Other | Admitting: Psychology

## 2014-10-21 DIAGNOSIS — G5602 Carpal tunnel syndrome, left upper limb: Secondary | ICD-10-CM | POA: Diagnosis not present

## 2014-10-21 DIAGNOSIS — M1812 Unilateral primary osteoarthritis of first carpometacarpal joint, left hand: Secondary | ICD-10-CM | POA: Diagnosis not present

## 2014-10-21 DIAGNOSIS — G5601 Carpal tunnel syndrome, right upper limb: Secondary | ICD-10-CM | POA: Diagnosis not present

## 2014-10-28 DIAGNOSIS — I10 Essential (primary) hypertension: Secondary | ICD-10-CM | POA: Diagnosis not present

## 2014-10-28 DIAGNOSIS — M545 Low back pain: Secondary | ICD-10-CM | POA: Diagnosis not present

## 2014-10-28 DIAGNOSIS — F325 Major depressive disorder, single episode, in full remission: Secondary | ICD-10-CM | POA: Diagnosis not present

## 2014-10-28 DIAGNOSIS — Z23 Encounter for immunization: Secondary | ICD-10-CM | POA: Diagnosis not present

## 2014-10-28 DIAGNOSIS — E780 Pure hypercholesterolemia: Secondary | ICD-10-CM | POA: Diagnosis not present

## 2014-11-02 ENCOUNTER — Ambulatory Visit (INDEPENDENT_AMBULATORY_CARE_PROVIDER_SITE_OTHER): Payer: Medicare Other | Admitting: Psychology

## 2014-11-02 DIAGNOSIS — F4323 Adjustment disorder with mixed anxiety and depressed mood: Secondary | ICD-10-CM | POA: Diagnosis not present

## 2014-11-04 ENCOUNTER — Ambulatory Visit: Payer: Medicare Other | Admitting: Psychology

## 2014-11-08 DIAGNOSIS — E780 Pure hypercholesterolemia: Secondary | ICD-10-CM | POA: Diagnosis not present

## 2014-11-08 DIAGNOSIS — I10 Essential (primary) hypertension: Secondary | ICD-10-CM | POA: Diagnosis not present

## 2014-11-10 DIAGNOSIS — M129 Arthropathy, unspecified: Secondary | ICD-10-CM | POA: Diagnosis not present

## 2014-11-10 DIAGNOSIS — G5601 Carpal tunnel syndrome, right upper limb: Secondary | ICD-10-CM | POA: Diagnosis not present

## 2014-11-10 DIAGNOSIS — M654 Radial styloid tenosynovitis [de Quervain]: Secondary | ICD-10-CM | POA: Diagnosis not present

## 2014-11-10 DIAGNOSIS — M1811 Unilateral primary osteoarthritis of first carpometacarpal joint, right hand: Secondary | ICD-10-CM | POA: Diagnosis not present

## 2014-11-11 ENCOUNTER — Ambulatory Visit: Payer: Medicare Other | Admitting: Psychology

## 2014-11-15 DIAGNOSIS — G5601 Carpal tunnel syndrome, right upper limb: Secondary | ICD-10-CM | POA: Diagnosis not present

## 2014-11-17 DIAGNOSIS — M1811 Unilateral primary osteoarthritis of first carpometacarpal joint, right hand: Secondary | ICD-10-CM | POA: Diagnosis not present

## 2014-12-29 DIAGNOSIS — M5417 Radiculopathy, lumbosacral region: Secondary | ICD-10-CM | POA: Diagnosis not present

## 2015-02-10 DIAGNOSIS — Z23 Encounter for immunization: Secondary | ICD-10-CM | POA: Diagnosis not present

## 2015-02-10 DIAGNOSIS — M545 Low back pain: Secondary | ICD-10-CM | POA: Diagnosis not present

## 2015-02-10 DIAGNOSIS — F324 Major depressive disorder, single episode, in partial remission: Secondary | ICD-10-CM | POA: Diagnosis not present

## 2015-02-16 DIAGNOSIS — M20002 Unspecified deformity of left finger(s): Secondary | ICD-10-CM | POA: Diagnosis not present

## 2015-02-16 DIAGNOSIS — M79645 Pain in left finger(s): Secondary | ICD-10-CM | POA: Diagnosis not present

## 2015-02-16 DIAGNOSIS — G5602 Carpal tunnel syndrome, left upper limb: Secondary | ICD-10-CM | POA: Diagnosis not present

## 2015-02-16 DIAGNOSIS — M19042 Primary osteoarthritis, left hand: Secondary | ICD-10-CM | POA: Diagnosis not present

## 2015-02-16 DIAGNOSIS — M151 Heberden's nodes (with arthropathy): Secondary | ICD-10-CM | POA: Diagnosis not present

## 2015-02-16 DIAGNOSIS — G5601 Carpal tunnel syndrome, right upper limb: Secondary | ICD-10-CM | POA: Diagnosis not present

## 2015-02-24 DIAGNOSIS — M19042 Primary osteoarthritis, left hand: Secondary | ICD-10-CM | POA: Diagnosis not present

## 2015-03-21 DIAGNOSIS — M19042 Primary osteoarthritis, left hand: Secondary | ICD-10-CM | POA: Diagnosis not present

## 2015-04-14 DIAGNOSIS — M19042 Primary osteoarthritis, left hand: Secondary | ICD-10-CM | POA: Diagnosis not present

## 2015-04-25 DIAGNOSIS — M19042 Primary osteoarthritis, left hand: Secondary | ICD-10-CM | POA: Diagnosis not present

## 2015-05-02 DIAGNOSIS — N3941 Urge incontinence: Secondary | ICD-10-CM | POA: Diagnosis not present

## 2015-05-02 DIAGNOSIS — I10 Essential (primary) hypertension: Secondary | ICD-10-CM | POA: Diagnosis not present

## 2015-05-02 DIAGNOSIS — F17200 Nicotine dependence, unspecified, uncomplicated: Secondary | ICD-10-CM | POA: Diagnosis not present

## 2015-05-02 DIAGNOSIS — Z1239 Encounter for other screening for malignant neoplasm of breast: Secondary | ICD-10-CM | POA: Diagnosis not present

## 2015-05-02 DIAGNOSIS — E780 Pure hypercholesterolemia: Secondary | ICD-10-CM | POA: Diagnosis not present

## 2015-05-02 DIAGNOSIS — E46 Unspecified protein-calorie malnutrition: Secondary | ICD-10-CM | POA: Diagnosis not present

## 2015-05-02 DIAGNOSIS — Z1211 Encounter for screening for malignant neoplasm of colon: Secondary | ICD-10-CM | POA: Diagnosis not present

## 2015-05-02 DIAGNOSIS — R1011 Right upper quadrant pain: Secondary | ICD-10-CM | POA: Diagnosis not present

## 2015-05-02 DIAGNOSIS — N951 Menopausal and female climacteric states: Secondary | ICD-10-CM | POA: Diagnosis not present

## 2015-05-02 DIAGNOSIS — Z0000 Encounter for general adult medical examination without abnormal findings: Secondary | ICD-10-CM | POA: Diagnosis not present

## 2015-05-02 DIAGNOSIS — F324 Major depressive disorder, single episode, in partial remission: Secondary | ICD-10-CM | POA: Diagnosis not present

## 2015-05-02 DIAGNOSIS — M545 Low back pain: Secondary | ICD-10-CM | POA: Diagnosis not present

## 2015-05-31 DIAGNOSIS — M65332 Trigger finger, left middle finger: Secondary | ICD-10-CM | POA: Diagnosis not present

## 2015-05-31 DIAGNOSIS — M19042 Primary osteoarthritis, left hand: Secondary | ICD-10-CM | POA: Diagnosis not present

## 2015-05-31 DIAGNOSIS — M65322 Trigger finger, left index finger: Secondary | ICD-10-CM | POA: Diagnosis not present

## 2015-06-16 DIAGNOSIS — T148 Other injury of unspecified body region: Secondary | ICD-10-CM | POA: Diagnosis not present

## 2015-06-30 DIAGNOSIS — M19042 Primary osteoarthritis, left hand: Secondary | ICD-10-CM | POA: Diagnosis not present

## 2015-06-30 DIAGNOSIS — M65332 Trigger finger, left middle finger: Secondary | ICD-10-CM | POA: Diagnosis not present

## 2015-06-30 DIAGNOSIS — M65322 Trigger finger, left index finger: Secondary | ICD-10-CM | POA: Diagnosis not present

## 2015-07-05 DIAGNOSIS — S61511A Laceration without foreign body of right wrist, initial encounter: Secondary | ICD-10-CM | POA: Diagnosis not present

## 2015-07-12 DIAGNOSIS — S60211A Contusion of right wrist, initial encounter: Secondary | ICD-10-CM | POA: Diagnosis not present

## 2015-07-15 DIAGNOSIS — M129 Arthropathy, unspecified: Secondary | ICD-10-CM | POA: Diagnosis not present

## 2015-07-15 DIAGNOSIS — M65322 Trigger finger, left index finger: Secondary | ICD-10-CM | POA: Diagnosis not present

## 2015-07-15 DIAGNOSIS — M65332 Trigger finger, left middle finger: Secondary | ICD-10-CM | POA: Diagnosis not present

## 2015-07-20 DIAGNOSIS — R1011 Right upper quadrant pain: Secondary | ICD-10-CM | POA: Diagnosis not present

## 2015-07-21 DIAGNOSIS — Z1231 Encounter for screening mammogram for malignant neoplasm of breast: Secondary | ICD-10-CM | POA: Diagnosis not present

## 2015-08-31 DIAGNOSIS — T148 Other injury of unspecified body region: Secondary | ICD-10-CM | POA: Diagnosis not present

## 2015-08-31 DIAGNOSIS — M5416 Radiculopathy, lumbar region: Secondary | ICD-10-CM | POA: Diagnosis not present

## 2015-08-31 DIAGNOSIS — R5381 Other malaise: Secondary | ICD-10-CM | POA: Diagnosis not present

## 2015-08-31 DIAGNOSIS — M542 Cervicalgia: Secondary | ICD-10-CM | POA: Diagnosis not present

## 2015-08-31 DIAGNOSIS — H579 Unspecified disorder of eye and adnexa: Secondary | ICD-10-CM | POA: Diagnosis not present

## 2015-09-01 ENCOUNTER — Other Ambulatory Visit: Payer: Self-pay | Admitting: Family Medicine

## 2015-09-01 ENCOUNTER — Ambulatory Visit
Admission: RE | Admit: 2015-09-01 | Discharge: 2015-09-01 | Disposition: A | Discharge status: Home / Self Care | Payer: Medicare Other | Source: Ambulatory Visit | Attending: Family Medicine | Admitting: Family Medicine

## 2015-09-01 DIAGNOSIS — M1812 Unilateral primary osteoarthritis of first carpometacarpal joint, left hand: Secondary | ICD-10-CM | POA: Diagnosis not present

## 2015-09-01 DIAGNOSIS — M542 Cervicalgia: Secondary | ICD-10-CM

## 2015-09-29 ENCOUNTER — Ambulatory Visit (INDEPENDENT_AMBULATORY_CARE_PROVIDER_SITE_OTHER): Payer: Medicare Other | Admitting: Psychology

## 2015-09-29 DIAGNOSIS — F4323 Adjustment disorder with mixed anxiety and depressed mood: Secondary | ICD-10-CM | POA: Diagnosis not present

## 2015-09-30 DIAGNOSIS — M19042 Primary osteoarthritis, left hand: Secondary | ICD-10-CM | POA: Diagnosis not present

## 2015-09-30 DIAGNOSIS — D2312 Other benign neoplasm of skin of left eyelid, including canthus: Secondary | ICD-10-CM | POA: Diagnosis not present

## 2015-10-03 DIAGNOSIS — M19042 Primary osteoarthritis, left hand: Secondary | ICD-10-CM | POA: Diagnosis not present

## 2015-10-13 DIAGNOSIS — M24542 Contracture, left hand: Secondary | ICD-10-CM | POA: Diagnosis not present

## 2015-10-13 DIAGNOSIS — M79642 Pain in left hand: Secondary | ICD-10-CM | POA: Diagnosis not present

## 2015-10-13 DIAGNOSIS — L905 Scar conditions and fibrosis of skin: Secondary | ICD-10-CM | POA: Diagnosis not present

## 2015-10-13 DIAGNOSIS — M79645 Pain in left finger(s): Secondary | ICD-10-CM | POA: Diagnosis not present

## 2015-10-18 DIAGNOSIS — M79645 Pain in left finger(s): Secondary | ICD-10-CM | POA: Diagnosis not present

## 2015-10-18 DIAGNOSIS — M79642 Pain in left hand: Secondary | ICD-10-CM | POA: Diagnosis not present

## 2015-10-18 DIAGNOSIS — L905 Scar conditions and fibrosis of skin: Secondary | ICD-10-CM | POA: Diagnosis not present

## 2015-10-18 DIAGNOSIS — M24542 Contracture, left hand: Secondary | ICD-10-CM | POA: Diagnosis not present

## 2015-10-20 ENCOUNTER — Ambulatory Visit: Payer: Medicare Other | Admitting: Psychology

## 2015-10-24 DIAGNOSIS — H2513 Age-related nuclear cataract, bilateral: Secondary | ICD-10-CM | POA: Diagnosis not present

## 2015-10-24 DIAGNOSIS — H52223 Regular astigmatism, bilateral: Secondary | ICD-10-CM | POA: Diagnosis not present

## 2015-10-24 DIAGNOSIS — H5213 Myopia, bilateral: Secondary | ICD-10-CM | POA: Diagnosis not present

## 2015-10-25 DIAGNOSIS — M79642 Pain in left hand: Secondary | ICD-10-CM | POA: Diagnosis not present

## 2015-10-25 DIAGNOSIS — L905 Scar conditions and fibrosis of skin: Secondary | ICD-10-CM | POA: Diagnosis not present

## 2015-10-25 DIAGNOSIS — M79645 Pain in left finger(s): Secondary | ICD-10-CM | POA: Diagnosis not present

## 2015-10-25 DIAGNOSIS — M24542 Contracture, left hand: Secondary | ICD-10-CM | POA: Diagnosis not present

## 2015-11-02 DIAGNOSIS — F324 Major depressive disorder, single episode, in partial remission: Secondary | ICD-10-CM | POA: Diagnosis not present

## 2015-11-02 DIAGNOSIS — F17200 Nicotine dependence, unspecified, uncomplicated: Secondary | ICD-10-CM | POA: Diagnosis not present

## 2015-11-02 DIAGNOSIS — R350 Frequency of micturition: Secondary | ICD-10-CM | POA: Diagnosis not present

## 2015-11-02 DIAGNOSIS — M8580 Other specified disorders of bone density and structure, unspecified site: Secondary | ICD-10-CM | POA: Diagnosis not present

## 2015-11-02 DIAGNOSIS — I10 Essential (primary) hypertension: Secondary | ICD-10-CM | POA: Diagnosis not present

## 2015-11-02 DIAGNOSIS — E46 Unspecified protein-calorie malnutrition: Secondary | ICD-10-CM | POA: Diagnosis not present

## 2015-11-02 DIAGNOSIS — Z23 Encounter for immunization: Secondary | ICD-10-CM | POA: Diagnosis not present

## 2015-11-02 DIAGNOSIS — Z1211 Encounter for screening for malignant neoplasm of colon: Secondary | ICD-10-CM | POA: Diagnosis not present

## 2015-11-02 DIAGNOSIS — M545 Low back pain: Secondary | ICD-10-CM | POA: Diagnosis not present

## 2015-11-02 DIAGNOSIS — E7800 Pure hypercholesterolemia, unspecified: Secondary | ICD-10-CM | POA: Diagnosis not present

## 2015-11-02 DIAGNOSIS — Z1389 Encounter for screening for other disorder: Secondary | ICD-10-CM | POA: Diagnosis not present

## 2015-11-03 ENCOUNTER — Ambulatory Visit (INDEPENDENT_AMBULATORY_CARE_PROVIDER_SITE_OTHER): Payer: Medicare Other | Admitting: Psychology

## 2015-11-03 DIAGNOSIS — F4323 Adjustment disorder with mixed anxiety and depressed mood: Secondary | ICD-10-CM | POA: Diagnosis not present

## 2015-11-04 DIAGNOSIS — M79645 Pain in left finger(s): Secondary | ICD-10-CM | POA: Diagnosis not present

## 2015-11-04 DIAGNOSIS — M79642 Pain in left hand: Secondary | ICD-10-CM | POA: Diagnosis not present

## 2015-11-15 DIAGNOSIS — M25649 Stiffness of unspecified hand, not elsewhere classified: Secondary | ICD-10-CM | POA: Diagnosis not present

## 2015-11-17 ENCOUNTER — Ambulatory Visit (INDEPENDENT_AMBULATORY_CARE_PROVIDER_SITE_OTHER): Payer: Medicare Other | Admitting: Psychology

## 2015-11-17 DIAGNOSIS — F4323 Adjustment disorder with mixed anxiety and depressed mood: Secondary | ICD-10-CM | POA: Diagnosis not present

## 2015-11-22 DIAGNOSIS — M2560 Stiffness of unspecified joint, not elsewhere classified: Secondary | ICD-10-CM | POA: Diagnosis not present

## 2015-11-28 DIAGNOSIS — M25542 Pain in joints of left hand: Secondary | ICD-10-CM | POA: Insufficient documentation

## 2015-11-30 DIAGNOSIS — M2560 Stiffness of unspecified joint, not elsewhere classified: Secondary | ICD-10-CM | POA: Diagnosis not present

## 2015-11-30 DIAGNOSIS — M25649 Stiffness of unspecified hand, not elsewhere classified: Secondary | ICD-10-CM | POA: Insufficient documentation

## 2015-12-08 ENCOUNTER — Ambulatory Visit: Payer: Medicare Other | Admitting: Psychology

## 2015-12-13 DIAGNOSIS — M25649 Stiffness of unspecified hand, not elsewhere classified: Secondary | ICD-10-CM | POA: Diagnosis not present

## 2015-12-13 DIAGNOSIS — M25542 Pain in joints of left hand: Secondary | ICD-10-CM | POA: Diagnosis not present

## 2015-12-13 DIAGNOSIS — M2560 Stiffness of unspecified joint, not elsewhere classified: Secondary | ICD-10-CM | POA: Diagnosis not present

## 2015-12-22 ENCOUNTER — Ambulatory Visit: Payer: Medicare Other | Admitting: Psychology

## 2016-01-10 DIAGNOSIS — M19041 Primary osteoarthritis, right hand: Secondary | ICD-10-CM | POA: Diagnosis not present

## 2016-01-18 DIAGNOSIS — S62637A Displaced fracture of distal phalanx of left little finger, initial encounter for closed fracture: Secondary | ICD-10-CM | POA: Diagnosis not present

## 2016-01-19 ENCOUNTER — Ambulatory Visit (INDEPENDENT_AMBULATORY_CARE_PROVIDER_SITE_OTHER): Payer: Medicare Other | Admitting: Psychology

## 2016-01-19 DIAGNOSIS — F4323 Adjustment disorder with mixed anxiety and depressed mood: Secondary | ICD-10-CM | POA: Diagnosis not present

## 2016-01-26 DIAGNOSIS — M19042 Primary osteoarthritis, left hand: Secondary | ICD-10-CM | POA: Insufficient documentation

## 2016-02-02 ENCOUNTER — Ambulatory Visit (INDEPENDENT_AMBULATORY_CARE_PROVIDER_SITE_OTHER): Payer: Medicare Other | Admitting: Psychology

## 2016-02-02 DIAGNOSIS — F4323 Adjustment disorder with mixed anxiety and depressed mood: Secondary | ICD-10-CM | POA: Diagnosis not present

## 2016-02-16 ENCOUNTER — Ambulatory Visit: Payer: Medicare Other | Admitting: Psychology

## 2016-02-24 DIAGNOSIS — J069 Acute upper respiratory infection, unspecified: Secondary | ICD-10-CM | POA: Diagnosis not present

## 2016-02-24 DIAGNOSIS — J157 Pneumonia due to Mycoplasma pneumoniae: Secondary | ICD-10-CM | POA: Diagnosis not present

## 2016-02-24 DIAGNOSIS — L03011 Cellulitis of right finger: Secondary | ICD-10-CM | POA: Diagnosis not present

## 2016-02-26 DIAGNOSIS — L0390 Cellulitis, unspecified: Secondary | ICD-10-CM | POA: Diagnosis not present

## 2016-02-26 DIAGNOSIS — B351 Tinea unguium: Secondary | ICD-10-CM | POA: Diagnosis not present

## 2016-03-01 ENCOUNTER — Ambulatory Visit (INDEPENDENT_AMBULATORY_CARE_PROVIDER_SITE_OTHER): Payer: Medicare Other | Admitting: Psychology

## 2016-03-01 DIAGNOSIS — F4323 Adjustment disorder with mixed anxiety and depressed mood: Secondary | ICD-10-CM | POA: Diagnosis not present

## 2016-03-15 ENCOUNTER — Ambulatory Visit: Payer: Medicare Other | Admitting: Psychology

## 2016-03-29 ENCOUNTER — Ambulatory Visit: Payer: Medicare Other | Admitting: Psychology

## 2016-04-12 ENCOUNTER — Ambulatory Visit (INDEPENDENT_AMBULATORY_CARE_PROVIDER_SITE_OTHER): Payer: Medicare Other | Admitting: Psychology

## 2016-04-12 DIAGNOSIS — F4323 Adjustment disorder with mixed anxiety and depressed mood: Secondary | ICD-10-CM | POA: Diagnosis not present

## 2016-04-26 ENCOUNTER — Ambulatory Visit: Payer: Medicare Other | Admitting: Psychology

## 2016-05-16 DIAGNOSIS — Z0000 Encounter for general adult medical examination without abnormal findings: Secondary | ICD-10-CM | POA: Diagnosis not present

## 2016-05-16 DIAGNOSIS — E7800 Pure hypercholesterolemia, unspecified: Secondary | ICD-10-CM | POA: Diagnosis not present

## 2016-05-16 DIAGNOSIS — Z1389 Encounter for screening for other disorder: Secondary | ICD-10-CM | POA: Diagnosis not present

## 2016-05-16 DIAGNOSIS — F17200 Nicotine dependence, unspecified, uncomplicated: Secondary | ICD-10-CM | POA: Diagnosis not present

## 2016-05-16 DIAGNOSIS — I10 Essential (primary) hypertension: Secondary | ICD-10-CM | POA: Diagnosis not present

## 2016-05-16 DIAGNOSIS — M545 Low back pain: Secondary | ICD-10-CM | POA: Diagnosis not present

## 2016-05-16 DIAGNOSIS — F324 Major depressive disorder, single episode, in partial remission: Secondary | ICD-10-CM | POA: Diagnosis not present

## 2016-05-16 DIAGNOSIS — M62838 Other muscle spasm: Secondary | ICD-10-CM | POA: Diagnosis not present

## 2016-05-16 DIAGNOSIS — E46 Unspecified protein-calorie malnutrition: Secondary | ICD-10-CM | POA: Diagnosis not present

## 2016-05-16 DIAGNOSIS — M8580 Other specified disorders of bone density and structure, unspecified site: Secondary | ICD-10-CM | POA: Diagnosis not present

## 2016-05-16 DIAGNOSIS — Z23 Encounter for immunization: Secondary | ICD-10-CM | POA: Diagnosis not present

## 2016-05-16 DIAGNOSIS — R32 Unspecified urinary incontinence: Secondary | ICD-10-CM | POA: Diagnosis not present

## 2016-05-24 ENCOUNTER — Ambulatory Visit: Payer: Medicare Other | Admitting: Psychology

## 2016-06-07 ENCOUNTER — Ambulatory Visit (INDEPENDENT_AMBULATORY_CARE_PROVIDER_SITE_OTHER): Payer: Medicare Other | Admitting: Psychology

## 2016-06-07 DIAGNOSIS — F4323 Adjustment disorder with mixed anxiety and depressed mood: Secondary | ICD-10-CM | POA: Diagnosis not present

## 2016-06-21 ENCOUNTER — Ambulatory Visit: Payer: Medicare Other | Admitting: Psychology

## 2016-07-05 ENCOUNTER — Ambulatory Visit: Payer: Medicare Other | Admitting: Psychology

## 2016-07-19 ENCOUNTER — Ambulatory Visit: Payer: Medicare Other | Admitting: Psychology

## 2016-07-25 DIAGNOSIS — M8580 Other specified disorders of bone density and structure, unspecified site: Secondary | ICD-10-CM | POA: Diagnosis not present

## 2016-07-25 DIAGNOSIS — S8001XA Contusion of right knee, initial encounter: Secondary | ICD-10-CM | POA: Diagnosis not present

## 2016-07-30 ENCOUNTER — Ambulatory Visit
Admission: RE | Admit: 2016-07-30 | Discharge: 2016-07-30 | Disposition: A | Discharge status: Home / Self Care | Payer: Medicare Other | Source: Ambulatory Visit | Attending: Family Medicine | Admitting: Family Medicine

## 2016-07-30 ENCOUNTER — Other Ambulatory Visit: Payer: Self-pay | Admitting: Family Medicine

## 2016-07-30 DIAGNOSIS — S8001XA Contusion of right knee, initial encounter: Secondary | ICD-10-CM

## 2016-08-02 ENCOUNTER — Ambulatory Visit (INDEPENDENT_AMBULATORY_CARE_PROVIDER_SITE_OTHER): Payer: Medicare Other | Admitting: Psychology

## 2016-08-02 DIAGNOSIS — F4323 Adjustment disorder with mixed anxiety and depressed mood: Secondary | ICD-10-CM | POA: Diagnosis not present

## 2016-08-16 ENCOUNTER — Ambulatory Visit (INDEPENDENT_AMBULATORY_CARE_PROVIDER_SITE_OTHER): Payer: Medicare Other | Admitting: Psychology

## 2016-08-16 DIAGNOSIS — F4323 Adjustment disorder with mixed anxiety and depressed mood: Secondary | ICD-10-CM | POA: Diagnosis not present

## 2016-08-27 DIAGNOSIS — S8001XA Contusion of right knee, initial encounter: Secondary | ICD-10-CM | POA: Diagnosis not present

## 2016-09-12 DIAGNOSIS — M8588 Other specified disorders of bone density and structure, other site: Secondary | ICD-10-CM | POA: Diagnosis not present

## 2016-09-20 DIAGNOSIS — N3000 Acute cystitis without hematuria: Secondary | ICD-10-CM | POA: Diagnosis not present

## 2016-09-20 DIAGNOSIS — R350 Frequency of micturition: Secondary | ICD-10-CM | POA: Diagnosis not present

## 2016-09-20 DIAGNOSIS — R112 Nausea with vomiting, unspecified: Secondary | ICD-10-CM | POA: Diagnosis not present

## 2016-09-20 DIAGNOSIS — N390 Urinary tract infection, site not specified: Secondary | ICD-10-CM | POA: Diagnosis not present

## 2016-09-23 ENCOUNTER — Encounter (HOSPITAL_COMMUNITY): Payer: Self-pay

## 2016-09-23 ENCOUNTER — Emergency Department (HOSPITAL_COMMUNITY)
Admission: EM | Admit: 2016-09-23 | Discharge: 2016-09-23 | Disposition: A | Discharge status: Home / Self Care | Payer: Medicare Other | Attending: Emergency Medicine | Admitting: Emergency Medicine

## 2016-09-23 DIAGNOSIS — Z7982 Long term (current) use of aspirin: Secondary | ICD-10-CM | POA: Diagnosis not present

## 2016-09-23 DIAGNOSIS — E860 Dehydration: Secondary | ICD-10-CM | POA: Diagnosis not present

## 2016-09-23 DIAGNOSIS — R112 Nausea with vomiting, unspecified: Secondary | ICD-10-CM | POA: Diagnosis not present

## 2016-09-23 DIAGNOSIS — F17210 Nicotine dependence, cigarettes, uncomplicated: Secondary | ICD-10-CM | POA: Diagnosis not present

## 2016-09-23 DIAGNOSIS — Z79899 Other long term (current) drug therapy: Secondary | ICD-10-CM | POA: Diagnosis not present

## 2016-09-23 DIAGNOSIS — Z8673 Personal history of transient ischemic attack (TIA), and cerebral infarction without residual deficits: Secondary | ICD-10-CM | POA: Diagnosis not present

## 2016-09-23 LAB — COMPREHENSIVE METABOLIC PANEL
ALBUMIN: 3.1 g/dL — AB (ref 3.5–5.0)
ALK PHOS: 66 U/L (ref 38–126)
ALT: 37 U/L (ref 14–54)
ANION GAP: 8 (ref 5–15)
AST: 53 U/L — ABNORMAL HIGH (ref 15–41)
BILIRUBIN TOTAL: 0.2 mg/dL — AB (ref 0.3–1.2)
BUN: 15 mg/dL (ref 6–20)
CO2: 24 mmol/L (ref 22–32)
CREATININE: 1.11 mg/dL — AB (ref 0.44–1.00)
Calcium: 9 mg/dL (ref 8.9–10.3)
Chloride: 104 mmol/L (ref 101–111)
GFR calc non Af Amer: 50 mL/min — ABNORMAL LOW (ref >60)
GFR, EST AFRICAN AMERICAN: 58 mL/min — AB (ref >60)
GLUCOSE: 105 mg/dL — AB (ref 65–99)
POTASSIUM: 3.8 mmol/L (ref 3.5–5.1)
SODIUM: 136 mmol/L (ref 135–145)
Total Protein: 6.6 g/dL (ref 6.5–8.1)

## 2016-09-23 LAB — CBC
HCT: 39.9 % (ref 36.0–46.0)
Hemoglobin: 12.9 g/dL (ref 12.0–15.0)
MCH: 31.9 pg (ref 26.0–34.0)
MCHC: 32.3 g/dL (ref 30.0–36.0)
MCV: 98.8 fL (ref 78.0–100.0)
PLATELETS: 217 10*3/uL (ref 150–400)
RBC: 4.04 MIL/uL (ref 3.87–5.11)
RDW: 14 % (ref 11.5–15.5)
WBC: 9.2 10*3/uL (ref 4.0–10.5)

## 2016-09-23 LAB — URINALYSIS, ROUTINE W REFLEX MICROSCOPIC
BILIRUBIN URINE: NEGATIVE
GLUCOSE, UA: NEGATIVE mg/dL
KETONES UR: NEGATIVE mg/dL
Leukocytes, UA: NEGATIVE
Nitrite: NEGATIVE
PROTEIN: NEGATIVE mg/dL
Specific Gravity, Urine: 1.025 (ref 1.005–1.030)
pH: 5.5 (ref 5.0–8.0)

## 2016-09-23 LAB — LIPASE, BLOOD: Lipase: 28 U/L (ref 11–51)

## 2016-09-23 LAB — URINE MICROSCOPIC-ADD ON

## 2016-09-23 LAB — I-STAT CG4 LACTIC ACID, ED: Lactic Acid, Venous: 1.79 mmol/L (ref 0.5–1.9)

## 2016-09-23 MED ORDER — ONDANSETRON HCL 4 MG PO TABS: 4.0000 mg | ORAL_TABLET | Freq: Four times a day (QID) | ORAL | 0 refills | Status: DC

## 2016-09-23 MED ORDER — SODIUM CHLORIDE 0.9 % IV BOLUS (SEPSIS)
1000.0000 mL | Freq: Once | INTRAVENOUS | Status: AC
  Administered 2016-09-23: 1000 mL via INTRAVENOUS

## 2016-09-23 MED ORDER — ONDANSETRON HCL 4 MG/2ML IJ SOLN
4.0000 mg | Freq: Once | INTRAMUSCULAR | Status: AC
  Administered 2016-09-23: 4 mg via INTRAVENOUS
  Filled 2016-09-23: qty 2

## 2016-09-23 MED ORDER — ACETAMINOPHEN 325 MG PO TABS
650.0000 mg | ORAL_TABLET | Freq: Once | ORAL | Status: AC
  Administered 2016-09-23: 650 mg via ORAL
  Filled 2016-09-23: qty 2

## 2016-09-23 NOTE — ED Provider Notes (Signed)
Medical screening examination/treatment/procedure(s) were conducted as a shared visit with non-physician practitioner(s) and myself.  I personally evaluated the patient during the encounter. Briefly, the patient is a 66 y.o. female currently being treated for pyelonephritis with ciprofloxacin presents to the ED for emesis. Labs reassuring. UA without evidence of infection. Treated symptomatically and able to tolerate by mouth intake. The patient is safe for discharge with strict return precautions. Patient to follow-up with her PCP.    EKG Interpretation  Date/Time:  Sunday September 23 2016 16:57:33 EDT Ventricular Rate:  82 PR Interval:    QRS Duration: 70 QT Interval:  419 QTC Calculation: 487 R Axis:   68 Text Interpretation:  Sinus rhythm LAE, consider biatrial enlargement No significant change since last tracing Confirmed by [facility] MD, Hoang Reich ([id number]) on 09/23/2016 5:21:11 PM           Fatima Blank, MD 09/23/16 2357

## 2016-09-23 NOTE — ED Triage Notes (Signed)
Pt was diagnosed on Thursday with a kidney infection by her PCP with fever of 102 and was given cipro. She reports nausea and vomiting that started Wednesday. Fever has subsided. Emesis x 2 today.

## 2016-09-23 NOTE — ED Provider Notes (Signed)
[facility] DEPT Provider Note   CSN: [id number] Arrival date & time: 09/23/16  1349     History   Chief Complaint Chief Complaint  Patient presents with  . Emesis    HPI Marie Lyons is a 66 y.o. female who presents with intractable nausea and vomiting. She has had generalized malaise, nausea, vomiting for the past week. She was seen by her PCP 3 days ago and was diagnosed with Pyelonephritis and given Phenergan and Cipro. She states Friday she was still feeling bad however yesterday she was feeling a lot better and was able to advance her diet. Today she ate breakfast and took Phenergan at home but still had persistent nausea and vomiting therefore came to the ED for further evaluation. She has been unable to take Cipro consistently. She denies flank pain however states that it's difficult for her to tell because she has chronic back pain due to scoliosis. She did notice a change in the color of her urine. She also reports a fever of 103 at home which has resolved since yesterday. She denies chest pain, shortness of breath, abdominal, diarrhea/constipation, dysuria, hematuria.  HPI  Past Medical History:  Diagnosis Date  . Cardiac arrhythmia   . Chronic back pain   . High cholesterol   . Migraine   . Scoliosis   . Stroke [facility])     There are no active problems to display for this patient.   Past Surgical History:  Procedure Laterality Date  . ABDOMINAL HYSTERECTOMY    . BUNIONECTOMY    . ROTATOR CUFF REPAIR      OB History    No data available       Home Medications    Prior to Admission medications   Medication Sig Start Date End Date Taking? Authorizing Provider  acetaminophen (TYLENOL) 500 MG tablet Take 1,500 mg by mouth every 6 (six) hours as needed for mild pain.   Yes Historical Provider, MD  aspirin 81 MG tablet Take 81 mg by mouth daily.   Yes Historical Provider, MD  calcium elemental as carbonate (TUMS ULTRA 1000) 400 MG tablet Chew 1,000 mg by  mouth daily.   Yes Historical Provider, MD  cholecalciferol (VITAMIN D) 1000 UNITS tablet Take 1,000 Units by mouth daily.    Yes Historical Provider, MD  ciprofloxacin (CIPRO) 500 MG tablet Take 500 mg by mouth 2 (two) times daily. Started on 09-21-16 for 10 days.   Yes Historical Provider, MD  escitalopram (LEXAPRO) 10 MG tablet Take 10 mg by mouth at bedtime.   Yes Historical Provider, MD  estradiol (VIVELLE-DOT) 0.1 MG/24HR patch Place 1 patch onto the skin every Wednesday.   Yes Historical Provider, MD  fluticasone (FLONASE) 50 MCG/ACT nasal spray Place 2 sprays into the nose daily. 07/25/13  Yes Roselee Culver, MD  HYDROcodone-acetaminophen (NORCO/VICODIN) 5-325 MG per tablet Take 1 tablet by mouth every 6 (six) hours as needed for moderate pain. 09/08/14  Yes Christopher Lawyer, PA-C  metoprolol tartrate (LOPRESSOR) 25 MG tablet Take 25 mg by mouth at bedtime.   Yes Historical Provider, MD  promethazine (PHENERGAN) 25 MG tablet Take 25 mg by mouth every 6 (six) hours as needed for nausea or vomiting.   Yes Historical Provider, MD  simvastatin (ZOCOR) 20 MG tablet Take 20 mg by mouth at bedtime.   Yes Historical Provider, MD  vitamin C (ASCORBIC ACID) 250 MG tablet Take 250 mg by mouth daily.   Yes Historical Provider, MD    Family  History No family history on file.  Social History Social History  Substance Use Topics  . Smoking status: Current Every Day Smoker    Packs/day: 0.50    Years: 25.00    Types: Cigarettes  . Smokeless tobacco: Never Used  . Alcohol use No     Allergies   Ciprofloxacin; Penicillins; Aspirin; Darvon [propoxyphene hcl]; Ibuprofen; Imitrex [sumatriptan]; Monosodium glutamate; Nitrates, organic; Nsaids; and Sulfa antibiotics   Review of Systems Review of Systems  Constitutional: Positive for chills and fever.  Respiratory: Negative for shortness of breath.   Cardiovascular: Negative for chest pain.  Gastrointestinal: Positive for nausea and vomiting.  Negative for abdominal pain and diarrhea.  Genitourinary: Negative for dysuria and flank pain.  All other systems reviewed and are negative.    Physical Exam Updated Vital Signs BP 112/67 (BP Location: Left Arm)   Pulse 80   Temp 99.1 F (37.3 C) (Oral)   Resp 16   SpO2 98%   Physical Exam  Constitutional: She is oriented to person, place, and time. She appears well-developed and well-nourished. No distress.  Dry mucous membranes  HENT:  Head: Normocephalic and atraumatic.  Eyes: Conjunctivae are normal. Pupils are equal, round, and reactive to light. Right eye exhibits no discharge. Left eye exhibits no discharge. No scleral icterus.  Neck: Normal range of motion. Neck supple.  Cardiovascular: Normal rate and regular rhythm.  Exam reveals no gallop and no friction rub.   No murmur heard. Pulmonary/Chest: Effort normal. No respiratory distress. She has wheezes. She has no rales. She exhibits no tenderness.  Faint expiratory wheezes in upper lung fields  Abdominal: Soft. Bowel sounds are normal. She exhibits no distension and no mass. There is tenderness. There is no rebound and no guarding. No hernia.  Bilateral CVA tenderness R>L. Scoliosis deformity noted. Suprapubic tenderness  Musculoskeletal: She exhibits no edema.  Neurological: She is alert and oriented to person, place, and time.  Skin: Skin is warm and dry.  Psychiatric: She has a normal mood and affect. Her behavior is normal.  Nursing note and vitals reviewed.    ED Treatments / Results  Labs (all labs ordered are listed, but only abnormal results are displayed) Labs Reviewed  COMPREHENSIVE METABOLIC PANEL - Abnormal; Notable for the following:       Result Value   Glucose, Bld 105 (*)    Creatinine, Ser 1.11 (*)    Albumin 3.1 (*)    AST 53 (*)    Total Bilirubin 0.2 (*)    GFR calc non Af Amer 50 (*)    GFR calc Af Amer 58 (*)    All other components within normal limits  URINALYSIS, ROUTINE W REFLEX  MICROSCOPIC (NOT AT [facility]) - Abnormal; Notable for the following:    Hgb urine dipstick TRACE (*)    All other components within normal limits  URINE MICROSCOPIC-ADD ON - Abnormal; Notable for the following:    Squamous Epithelial / LPF 0-5 (*)    Bacteria, UA RARE (*)    All other components within normal limits  LIPASE, BLOOD  CBC  I-STAT CG4 LACTIC ACID, ED    EKG  EKG Interpretation  Date/Time:  Sunday September 23 2016 16:57:33 EDT Ventricular Rate:  82 PR Interval:    QRS Duration: 70 QT Interval:  419 QTC Calculation: 487 R Axis:   68 Text Interpretation:  Sinus rhythm LAE, consider biatrial enlargement No significant change since last tracing Confirmed by [facility] MD, PEDRO [id number]) on 09/23/2016  5:21:11 PM       Radiology No results found.  Procedures Procedures (including critical care time)  Medications Ordered in ED Medications  sodium chloride 0.9 % bolus 1,000 mL (0 mLs Intravenous Stopped 09/23/16 1858)  acetaminophen (TYLENOL) tablet 650 mg (650 mg Oral Given 09/23/16 1717)  ondansetron (ZOFRAN) injection 4 mg (4 mg Intravenous Given 09/23/16 1717)     Initial Impression / Assessment and Plan / ED Course  I have reviewed the triage vital signs and the nursing notes.  Pertinent labs & imaging results that were available during my care of the patient were reviewed by me and considered in my medical decision making (see chart for details).  Clinical Course   66 year old female presents with nausea and vomiting. Patient is afebrile, not tachycardic or tachypneic, normotensive, and not hypoxic. CBC is unremarkable. CMP remarkable for mild elevation of SCr to 1.11. Lipase normal. Lactic acid is normal. UA remarkable for hgb and rare bacteria. IVF and Zofran given. Patient tolerated PO challenge is ready to go home. Rx for Zofran provided and PCP follow up recommended. Patient is NAD, non-toxic, with stable VS. Patient is informed of clinical course, understands  medical decision making process, and agrees with plan. Opportunity for questions provided and all questions answered. Return precautions given.   Final Clinical Impressions(s) / ED Diagnoses   Final diagnoses:  Non-intractable vomiting with nausea, vomiting of unspecified type    New Prescriptions Discharge Medication List as of 09/23/2016  8:47 PM    START taking these medications   Details  ondansetron (ZOFRAN) 4 MG tablet Take 1 tablet (4 mg total) by mouth every 6 (six) hours., Starting Sun 09/23/2016, Print         Recardo Evangelist, PA-C 09/23/16 2212

## 2016-09-23 NOTE — Discharge Instructions (Signed)
Continue your antibiotic and please follow up with your family doctor

## 2016-09-26 DIAGNOSIS — Z1211 Encounter for screening for malignant neoplasm of colon: Secondary | ICD-10-CM | POA: Diagnosis not present

## 2016-09-27 ENCOUNTER — Ambulatory Visit (INDEPENDENT_AMBULATORY_CARE_PROVIDER_SITE_OTHER): Payer: Medicare Other | Admitting: Psychology

## 2016-09-27 DIAGNOSIS — F4323 Adjustment disorder with mixed anxiety and depressed mood: Secondary | ICD-10-CM

## 2016-10-11 ENCOUNTER — Ambulatory Visit: Payer: Medicare Other | Admitting: Psychology

## 2016-10-25 ENCOUNTER — Ambulatory Visit (INDEPENDENT_AMBULATORY_CARE_PROVIDER_SITE_OTHER): Payer: Medicare Other | Admitting: Psychology

## 2016-10-25 DIAGNOSIS — F4323 Adjustment disorder with mixed anxiety and depressed mood: Secondary | ICD-10-CM

## 2016-11-08 ENCOUNTER — Ambulatory Visit: Payer: Medicare Other | Admitting: Psychology

## 2016-11-21 DIAGNOSIS — Z23 Encounter for immunization: Secondary | ICD-10-CM | POA: Diagnosis not present

## 2016-12-20 ENCOUNTER — Ambulatory Visit (INDEPENDENT_AMBULATORY_CARE_PROVIDER_SITE_OTHER): Payer: Medicare Other | Admitting: Psychology

## 2016-12-20 DIAGNOSIS — F4323 Adjustment disorder with mixed anxiety and depressed mood: Secondary | ICD-10-CM

## 2017-02-12 ENCOUNTER — Emergency Department (HOSPITAL_COMMUNITY): Payer: Medicare Other

## 2017-02-12 ENCOUNTER — Inpatient Hospital Stay (HOSPITAL_COMMUNITY)
Admission: EM | Admit: 2017-02-12 | Discharge: 2017-02-15 | DRG: 193 | Disposition: A | Discharge status: Home / Self Care | Payer: Medicare Other | Attending: Internal Medicine | Admitting: Internal Medicine

## 2017-02-12 ENCOUNTER — Encounter (HOSPITAL_COMMUNITY): Payer: Self-pay | Admitting: Emergency Medicine

## 2017-02-12 DIAGNOSIS — G8929 Other chronic pain: Secondary | ICD-10-CM | POA: Diagnosis present

## 2017-02-12 DIAGNOSIS — D6489 Other specified anemias: Secondary | ICD-10-CM | POA: Diagnosis present

## 2017-02-12 DIAGNOSIS — Z79890 Hormone replacement therapy (postmenopausal): Secondary | ICD-10-CM

## 2017-02-12 DIAGNOSIS — Z90710 Acquired absence of both cervix and uterus: Secondary | ICD-10-CM

## 2017-02-12 DIAGNOSIS — Z888 Allergy status to other drugs, medicaments and biological substances status: Secondary | ICD-10-CM

## 2017-02-12 DIAGNOSIS — Z7982 Long term (current) use of aspirin: Secondary | ICD-10-CM

## 2017-02-12 DIAGNOSIS — Z886 Allergy status to analgesic agent status: Secondary | ICD-10-CM

## 2017-02-12 DIAGNOSIS — Z8249 Family history of ischemic heart disease and other diseases of the circulatory system: Secondary | ICD-10-CM

## 2017-02-12 DIAGNOSIS — Z880 Allergy status to penicillin: Secondary | ICD-10-CM

## 2017-02-12 DIAGNOSIS — E7800 Pure hypercholesterolemia, unspecified: Secondary | ICD-10-CM | POA: Diagnosis present

## 2017-02-12 DIAGNOSIS — J208 Acute bronchitis due to other specified organisms: Secondary | ICD-10-CM | POA: Diagnosis present

## 2017-02-12 DIAGNOSIS — Z720 Tobacco use: Secondary | ICD-10-CM | POA: Diagnosis not present

## 2017-02-12 DIAGNOSIS — I499 Cardiac arrhythmia, unspecified: Secondary | ICD-10-CM | POA: Diagnosis present

## 2017-02-12 DIAGNOSIS — J441 Chronic obstructive pulmonary disease with (acute) exacerbation: Secondary | ICD-10-CM | POA: Diagnosis not present

## 2017-02-12 DIAGNOSIS — Z79891 Long term (current) use of opiate analgesic: Secondary | ICD-10-CM | POA: Diagnosis not present

## 2017-02-12 DIAGNOSIS — M419 Scoliosis, unspecified: Secondary | ICD-10-CM | POA: Diagnosis present

## 2017-02-12 DIAGNOSIS — M549 Dorsalgia, unspecified: Secondary | ICD-10-CM | POA: Diagnosis present

## 2017-02-12 DIAGNOSIS — F17210 Nicotine dependence, cigarettes, uncomplicated: Secondary | ICD-10-CM | POA: Diagnosis present

## 2017-02-12 DIAGNOSIS — J181 Lobar pneumonia, unspecified organism: Secondary | ICD-10-CM

## 2017-02-12 DIAGNOSIS — J9601 Acute respiratory failure with hypoxia: Secondary | ICD-10-CM | POA: Diagnosis not present

## 2017-02-12 DIAGNOSIS — J440 Chronic obstructive pulmonary disease with acute lower respiratory infection: Secondary | ICD-10-CM | POA: Diagnosis present

## 2017-02-12 DIAGNOSIS — Z79899 Other long term (current) drug therapy: Secondary | ICD-10-CM

## 2017-02-12 DIAGNOSIS — R69 Illness, unspecified: Secondary | ICD-10-CM

## 2017-02-12 DIAGNOSIS — J111 Influenza due to unidentified influenza virus with other respiratory manifestations: Secondary | ICD-10-CM

## 2017-02-12 DIAGNOSIS — R630 Anorexia: Secondary | ICD-10-CM

## 2017-02-12 DIAGNOSIS — I10 Essential (primary) hypertension: Secondary | ICD-10-CM | POA: Diagnosis not present

## 2017-02-12 DIAGNOSIS — J1100 Influenza due to unidentified influenza virus with unspecified type of pneumonia: Secondary | ICD-10-CM

## 2017-02-12 DIAGNOSIS — J189 Pneumonia, unspecified organism: Secondary | ICD-10-CM | POA: Diagnosis not present

## 2017-02-12 DIAGNOSIS — R05 Cough: Secondary | ICD-10-CM | POA: Diagnosis not present

## 2017-02-12 DIAGNOSIS — J449 Chronic obstructive pulmonary disease, unspecified: Secondary | ICD-10-CM

## 2017-02-12 DIAGNOSIS — Z881 Allergy status to other antibiotic agents status: Secondary | ICD-10-CM | POA: Diagnosis not present

## 2017-02-12 DIAGNOSIS — E785 Hyperlipidemia, unspecified: Secondary | ICD-10-CM | POA: Diagnosis not present

## 2017-02-12 DIAGNOSIS — J1000 Influenza due to other identified influenza virus with unspecified type of pneumonia: Principal | ICD-10-CM | POA: Diagnosis present

## 2017-02-12 DIAGNOSIS — Z8673 Personal history of transient ischemic attack (TIA), and cerebral infarction without residual deficits: Secondary | ICD-10-CM

## 2017-02-12 DIAGNOSIS — R0602 Shortness of breath: Secondary | ICD-10-CM | POA: Diagnosis not present

## 2017-02-12 DIAGNOSIS — G43909 Migraine, unspecified, not intractable, without status migrainosus: Secondary | ICD-10-CM | POA: Diagnosis present

## 2017-02-12 DIAGNOSIS — Z882 Allergy status to sulfonamides status: Secondary | ICD-10-CM | POA: Diagnosis not present

## 2017-02-12 DIAGNOSIS — Z91018 Allergy to other foods: Secondary | ICD-10-CM

## 2017-02-12 LAB — I-STAT TROPONIN, ED: TROPONIN I, POC: 0.02 ng/mL (ref 0.00–0.08)

## 2017-02-12 LAB — CBC WITH DIFFERENTIAL/PLATELET
BASOS PCT: 1 %
Basophils Absolute: 0.1 10*3/uL (ref 0.0–0.1)
Eosinophils Absolute: 0.1 10*3/uL (ref 0.0–0.7)
Eosinophils Relative: 1 %
HCT: 40.8 % (ref 36.0–46.0)
HEMOGLOBIN: 13.6 g/dL (ref 12.0–15.0)
LYMPHS PCT: 17 %
Lymphs Abs: 1.3 10*3/uL (ref 0.7–4.0)
MCH: 31.6 pg (ref 26.0–34.0)
MCHC: 33.3 g/dL (ref 30.0–36.0)
MCV: 94.9 fL (ref 78.0–100.0)
MONOS PCT: 10 %
Monocytes Absolute: 0.8 10*3/uL (ref 0.1–1.0)
NEUTROS ABS: 5.5 10*3/uL (ref 1.7–7.7)
NEUTROS PCT: 71 %
Platelets: 275 10*3/uL (ref 150–400)
RBC: 4.3 MIL/uL (ref 3.87–5.11)
RDW: 13.2 % (ref 11.5–15.5)
WBC: 7.8 10*3/uL (ref 4.0–10.5)

## 2017-02-12 LAB — COMPREHENSIVE METABOLIC PANEL
ALK PHOS: 59 U/L (ref 38–126)
ALT: 31 U/L (ref 14–54)
ANION GAP: 10 (ref 5–15)
AST: 33 U/L (ref 15–41)
Albumin: 3.4 g/dL — ABNORMAL LOW (ref 3.5–5.0)
BILIRUBIN TOTAL: 0.7 mg/dL (ref 0.3–1.2)
BUN: 7 mg/dL (ref 6–20)
CHLORIDE: 100 mmol/L — AB (ref 101–111)
CO2: 29 mmol/L (ref 22–32)
Calcium: 9.2 mg/dL (ref 8.9–10.3)
Creatinine, Ser: 0.67 mg/dL (ref 0.44–1.00)
GFR calc non Af Amer: >60 mL/min (ref >60)
GLUCOSE: 105 mg/dL — AB (ref 65–99)
POTASSIUM: 4 mmol/L (ref 3.5–5.1)
Sodium: 139 mmol/L (ref 135–145)
Total Protein: 6.9 g/dL (ref 6.5–8.1)

## 2017-02-12 MED ORDER — ALBUTEROL SULFATE (2.5 MG/3ML) 0.083% IN NEBU: 2.5000 mg | INHALATION_SOLUTION | RESPIRATORY_TRACT | Status: DC | PRN

## 2017-02-12 MED ORDER — IPRATROPIUM BROMIDE 0.02 % IN SOLN
0.5000 mg | Freq: Once | RESPIRATORY_TRACT | Status: AC
  Administered 2017-02-12: 0.5 mg via RESPIRATORY_TRACT
  Filled 2017-02-12: qty 2.5

## 2017-02-12 MED ORDER — SIMVASTATIN 20 MG PO TABS
20.0000 mg | ORAL_TABLET | Freq: Every day | ORAL | Status: DC
  Administered 2017-02-12 – 2017-02-14 (×3): 20 mg via ORAL
  Filled 2017-02-12 (×3): qty 1

## 2017-02-12 MED ORDER — ONDANSETRON HCL 4 MG PO TABS
4.0000 mg | ORAL_TABLET | Freq: Four times a day (QID) | ORAL | Status: DC | PRN
  Administered 2017-02-13: 4 mg via ORAL
  Filled 2017-02-12: qty 1

## 2017-02-12 MED ORDER — ASPIRIN EC 81 MG PO TBEC
81.0000 mg | DELAYED_RELEASE_TABLET | Freq: Every day | ORAL | Status: DC
  Administered 2017-02-13 – 2017-02-15 (×3): 81 mg via ORAL
  Filled 2017-02-12 (×3): qty 1

## 2017-02-12 MED ORDER — DEXTROSE 5 % IV SOLN
500.0000 mg | INTRAVENOUS | Status: DC
  Administered 2017-02-13: 500 mg via INTRAVENOUS
  Filled 2017-02-12: qty 500

## 2017-02-12 MED ORDER — ACETAMINOPHEN 325 MG PO TABS
650.0000 mg | ORAL_TABLET | Freq: Once | ORAL | Status: AC
  Administered 2017-02-12: 650 mg via ORAL
  Filled 2017-02-12: qty 2

## 2017-02-12 MED ORDER — METOPROLOL SUCCINATE ER 25 MG PO TB24
25.0000 mg | ORAL_TABLET | Freq: Every day | ORAL | Status: DC
  Administered 2017-02-12 – 2017-02-14 (×3): 25 mg via ORAL
  Filled 2017-02-12 (×3): qty 1

## 2017-02-12 MED ORDER — ESCITALOPRAM OXALATE 10 MG PO TABS
10.0000 mg | ORAL_TABLET | Freq: Every day | ORAL | Status: DC
  Administered 2017-02-12 – 2017-02-14 (×3): 10 mg via ORAL
  Filled 2017-02-12 (×3): qty 1

## 2017-02-12 MED ORDER — ALBUTEROL SULFATE (2.5 MG/3ML) 0.083% IN NEBU
5.0000 mg | INHALATION_SOLUTION | Freq: Once | RESPIRATORY_TRACT | Status: AC
  Administered 2017-02-12: 5 mg via RESPIRATORY_TRACT
  Filled 2017-02-12: qty 6

## 2017-02-12 MED ORDER — SODIUM CHLORIDE 0.9 % IV BOLUS (SEPSIS)
1000.0000 mL | Freq: Once | INTRAVENOUS | Status: AC
  Administered 2017-02-12: 1000 mL via INTRAVENOUS

## 2017-02-12 MED ORDER — HYDROCODONE-ACETAMINOPHEN 5-325 MG PO TABS
1.0000 | ORAL_TABLET | Freq: Four times a day (QID) | ORAL | Status: DC | PRN
Start: 2017-02-12 — End: 2017-02-15
  Administered 2017-02-12 – 2017-02-15 (×6): 1 via ORAL
  Filled 2017-02-12 (×6): qty 1

## 2017-02-12 MED ORDER — DEXTROSE 5 % IV SOLN
1.0000 g | INTRAVENOUS | Status: DC
  Administered 2017-02-13 – 2017-02-14 (×2): 1 g via INTRAVENOUS
  Filled 2017-02-12 (×3): qty 10

## 2017-02-12 MED ORDER — ALBUTEROL SULFATE (2.5 MG/3ML) 0.083% IN NEBU: 2.5000 mg | INHALATION_SOLUTION | Freq: Four times a day (QID) | RESPIRATORY_TRACT | Status: DC

## 2017-02-12 MED ORDER — ACETAMINOPHEN 650 MG RE SUPP: 650.0000 mg | Freq: Four times a day (QID) | RECTAL | Status: DC | PRN

## 2017-02-12 MED ORDER — DEXTROSE 5 % IV SOLN
500.0000 mg | Freq: Once | INTRAVENOUS | Status: AC
  Administered 2017-02-12: 500 mg via INTRAVENOUS
  Filled 2017-02-12: qty 500

## 2017-02-12 MED ORDER — DEXTROSE 5 % IV SOLN
1.0000 g | Freq: Once | INTRAVENOUS | Status: AC
  Administered 2017-02-12: 1 g via INTRAVENOUS
  Filled 2017-02-12: qty 10

## 2017-02-12 MED ORDER — ACETAMINOPHEN 325 MG PO TABS
650.0000 mg | ORAL_TABLET | Freq: Four times a day (QID) | ORAL | Status: DC | PRN
  Administered 2017-02-13 – 2017-02-15 (×4): 650 mg via ORAL
  Filled 2017-02-12 (×4): qty 2

## 2017-02-12 MED ORDER — ENOXAPARIN SODIUM 30 MG/0.3ML ~~LOC~~ SOLN
30.0000 mg | SUBCUTANEOUS | Status: DC
  Administered 2017-02-12 – 2017-02-14 (×3): 30 mg via SUBCUTANEOUS
  Filled 2017-02-12 (×3): qty 0.3

## 2017-02-12 MED ORDER — DIPHENHYDRAMINE HCL 50 MG/ML IJ SOLN
12.5000 mg | Freq: Once | INTRAMUSCULAR | Status: DC
  Filled 2017-02-12: qty 1

## 2017-02-12 MED ORDER — ONDANSETRON HCL 4 MG/2ML IJ SOLN: 4.0000 mg | Freq: Four times a day (QID) | INTRAMUSCULAR | Status: DC | PRN

## 2017-02-12 MED ORDER — IPRATROPIUM BROMIDE 0.02 % IN SOLN: 0.5000 mg | Freq: Four times a day (QID) | RESPIRATORY_TRACT | Status: DC

## 2017-02-12 MED ORDER — PREDNISONE 20 MG PO TABS
60.0000 mg | ORAL_TABLET | Freq: Once | ORAL | Status: AC
  Administered 2017-02-12: 60 mg via ORAL
  Filled 2017-02-12: qty 3

## 2017-02-12 NOTE — H&P (Addendum)
History and Physical    Marie Lyons [id number] DOB: Jan 15, 1950 DOA: 02/12/2017  PCP: Vena Austria, MD  Patient coming from: Home.  Chief Complaint: Shortness of breath.  HPI: Marie Lyons is a 67 y.o. female with history of tobacco abuse, hypertension hyperlipidemia presents to the ER because of worsening shortness of breath over the last 4-5 days. Denies any chest pain. Has been having productive cough. Initially in the ER patient was found to be wheezing which improved with nebulizer treatment. Chest x-ray shows infiltrates concerning for pneumonia and patient was requiring oxygen for hypoxia.   ED Course: Patient was started on nebulizer treatment and antibiotics. Breathing improved with nebulizer treatment.  Review of Systems: As per HPI, rest all negative.   Past Medical History:  Diagnosis Date  . Cardiac arrhythmia   . Chronic back pain   . High cholesterol   . Migraine   . Scoliosis   . Stroke [facility])     Past Surgical History:  Procedure Laterality Date  . ABDOMINAL HYSTERECTOMY    . BUNIONECTOMY    . ROTATOR CUFF REPAIR       reports that she has been smoking Cigarettes.  She has a 12.50 pack-year smoking history. She has never used smokeless tobacco. She reports that she does not drink alcohol or use drugs.  Allergies  Allergen Reactions  . Ciprofloxacin Shortness Of Breath    Denies this allergy on 09/23/16 (??)  . Penicillins Anaphylaxis    Has patient had a PCN reaction causing immediate rash, facial/tongue/throat swelling, SOB or lightheadedness with hypotension: Yes Has patient had a PCN reaction causing severe rash involving mucus membranes or skin necrosis: Yes Has patient had a PCN reaction that required hospitalization Yes Has patient had a PCN reaction occurring within the last 10 years: No If all of the above answers are "NO", then may proceed with Cephalosporin use.   . Aspirin Other (See Comments)    Aggravates ulcer  . Darvon  [Propoxyphene Hcl] Other (See Comments)    Wheezing  . Ibuprofen Other (See Comments)    Wheezing  . Imitrex [Sumatriptan] Other (See Comments)    Contraindicated because pt had a stroke  . Meperidine Other (See Comments)    Unknown reaction (per patient)  . Monosodium Glutamate Other (See Comments)    Migraine   . Nitrates, Organic Other (See Comments)    Migraine   . Nsaids Other (See Comments)    Aggravates ulcer  . Sulfa Antibiotics Other (See Comments)    Unknown reaction (per patient)    Family History  Problem Relation Age of Onset  . CAD Father   . Stroke Neg Hx     Prior to Admission medications   Medication Sig Start Date End Date Taking? Authorizing Provider  acetaminophen (TYLENOL) 500 MG tablet Take 500 mg by mouth every 6 (six) hours as needed for headache (or pain).    Yes Historical Provider, MD  aspirin 81 MG tablet Take 81 mg by mouth daily.   Yes Historical Provider, MD  calcium elemental as carbonate (TUMS ULTRA 1000) 400 MG tablet Chew 400-800 mg by mouth daily as needed for heartburn.    Yes Historical Provider, MD  cholecalciferol (VITAMIN D) 1000 UNITS tablet Take 1,000 Units by mouth daily.    Yes Historical Provider, MD  escitalopram (LEXAPRO) 10 MG tablet Take 10 mg by mouth at bedtime.   Yes Historical Provider, MD  estradiol (VIVELLE-DOT) 0.1 MG/24HR patch Place 1 patch onto the  skin every Wednesday.   Yes Historical Provider, MD  HYDROcodone-acetaminophen (NORCO/VICODIN) 5-325 MG per tablet Take 1 tablet by mouth every 6 (six) hours as needed for moderate pain. 09/08/14  Yes Christopher Lawyer, PA-C  MELATONIN PO Take 1 tablet by mouth at bedtime.   Yes Historical Provider, MD  metoprolol succinate (TOPROL-XL) 25 MG 24 hr tablet Take 25 mg by mouth at bedtime.   Yes Historical Provider, MD  simvastatin (ZOCOR) 20 MG tablet Take 20 mg by mouth at bedtime.   Yes Historical Provider, MD  vitamin C (ASCORBIC ACID) 250 MG tablet Take 250 mg by mouth daily.    Yes Historical Provider, MD  fluticasone (FLONASE) 50 MCG/ACT nasal spray Place 2 sprays into the nose daily. Patient not taking: Reported on 02/12/2017 07/25/13   Roselee Culver, MD  ondansetron (ZOFRAN) 4 MG tablet Take 1 tablet (4 mg total) by mouth every 6 (six) hours. Patient not taking: Reported on 02/12/2017 09/23/16   Recardo Evangelist, PA-C    Physical Exam: Vitals:   02/12/17 2045 02/12/17 2130 02/12/17 2201 02/12/17 2205  BP: 100/63 105/87  126/72  Pulse: 97   (!) 101  Resp: 26 20  (!) 28  Temp:    98.1 F (36.7 C)  TempSrc:    Oral  SpO2: 95%   94%  Weight:   47.5 kg (104 lb 12.8 oz)       Constitutional: Moderately built and nourished. Vitals:   02/12/17 2045 02/12/17 2130 02/12/17 2201 02/12/17 2205  BP: 100/63 105/87  126/72  Pulse: 97   (!) 101  Resp: 26 20  (!) 28  Temp:    98.1 F (36.7 C)  TempSrc:    Oral  SpO2: 95%   94%  Weight:   47.5 kg (104 lb 12.8 oz)    Eyes: Anicteric. No pallor. ENMT: No discharge from ears eyes nose and mouth. Neck: No mass felt. No JVD appreciated. Respiratory: No rhonchi or crepitations. Cardiovascular: S1 and S2 heard no murmurs appreciated. Abdomen: Soft nontender bowel sounds present. No guarding or rigidity. Musculoskeletal: No edema no joint effusion. Skin: No rash skin appears warm. Neurologic: Alert awake oriented to time place and person. Moves all extremities. Psychiatric: Appears normal. Normal affect.   Labs on Admission: I have personally reviewed following labs and imaging studies  CBC:  Recent Labs Lab 02/12/17 1638  WBC 7.8  NEUTROABS 5.5  HGB 13.6  HCT 40.8  MCV 94.9  PLT 123XX123   Basic Metabolic Panel:  Recent Labs Lab 02/12/17 1638  NA 139  K 4.0  CL 100*  CO2 29  GLUCOSE 105*  BUN 7  CREATININE 0.67  CALCIUM 9.2   GFR: CrCl cannot be calculated (Unknown ideal weight.). Liver Function Tests:  Recent Labs Lab 02/12/17 1638  AST 33  ALT 31  ALKPHOS 59  BILITOT 0.7  PROT  6.9  ALBUMIN 3.4*   No results for input(s): LIPASE, AMYLASE in the last 168 hours. No results for input(s): AMMONIA in the last 168 hours. Coagulation Profile: No results for input(s): INR, PROTIME in the last 168 hours. Cardiac Enzymes: No results for input(s): CKTOTAL, CKMB, CKMBINDEX, TROPONINI in the last 168 hours. BNP (last 3 results) No results for input(s): PROBNP in the last 8760 hours. HbA1C: No results for input(s): HGBA1C in the last 72 hours. CBG: No results for input(s): GLUCAP in the last 168 hours. Lipid Profile: No results for input(s): CHOL, HDL, LDLCALC, TRIG, CHOLHDL, LDLDIRECT in  the last 72 hours. Thyroid Function Tests: No results for input(s): TSH, T4TOTAL, FREET4, T3FREE, THYROIDAB in the last 72 hours. Anemia Panel: No results for input(s): VITAMINB12, FOLATE, FERRITIN, TIBC, IRON, RETICCTPCT in the last 72 hours. Urine analysis:    Component Value Date/Time   COLORURINE YELLOW 09/23/2016 1836   APPEARANCEUR CLEAR 09/23/2016 1836   LABSPEC 1.025 09/23/2016 1836   PHURINE 5.5 09/23/2016 1836   GLUCOSEU NEGATIVE 09/23/2016 1836   HGBUR TRACE (A) 09/23/2016 1836   BILIRUBINUR NEGATIVE 09/23/2016 1836   KETONESUR NEGATIVE 09/23/2016 1836   PROTEINUR NEGATIVE 09/23/2016 1836   UROBILINOGEN 1.0 09/09/2012 2015   NITRITE NEGATIVE 09/23/2016 1836   LEUKOCYTESUR NEGATIVE 09/23/2016 1836   Sepsis Labs: @LABRCNTIP (procalcitonin:4,lacticidven:4) )No results found for this or any previous visit (from the past 240 hour(s)).   Radiological Exams on Admission: Dg Chest 2 View  Result Date: 02/12/2017 CLINICAL DATA:  Cough, shortness of Breath fever for 1 week EXAM: CHEST  2 VIEW COMPARISON:  01/13/2013 FINDINGS: Cardiomediastinal silhouette is stable. Thoracolumbar scoliosis again noted. Streaky bronchitic changes or early infiltrate in right middle lobe best seen on lateral view. No pulmonary edema. IMPRESSION: No pulmonary edema. Thoracolumbar scoliosis again  noted. Streaky bronchitic changes or early infiltrate best seen on lateral view. Follow-up to resolution after appropriate treatment is recommended. Electronically Signed   By: Lahoma Crocker M.D.   On: 02/12/2017 15:51    EKG: Independently reviewed. Normal sinus rhythm.  Assessment/Plan Principal Problem:   Acute respiratory failure with hypoxia (HCC) Active Problems:   Community acquired pneumonia of right middle lobe of lung ([place])   Tobacco user   Essential hypertension   HLD (hyperlipidemia)    1. Acute respiratory failure with hypoxia probably secondary to pneumonia and bronchitis - check influenza PCR and if positive will keep patient on Tamiflu. For now patient is on empiric antibiotics for community acquired pneumonia. Check urine for Legionella strep antigen. Follow sputum cultures. 2. Hypertension on metoprolol. 3. Hyperlipidemia on statins. 4. Tobacco abuse - patient states she quit last week.   DVT prophylaxis: Lovenox. Code Status: Full code.  Family Communication: Patient's son.  Disposition Plan: Home.  Consults called: None.  Admission status: Inpatient.    Rise Patience MD [facility] Pager [phone].  If 7PM-7AM, please contact night-coverage [email or web address] Password [facility]  02/12/2017, 10:08 PM

## 2017-02-12 NOTE — ED Notes (Signed)
Alleen Borne, RN accepts report at this time.

## 2017-02-12 NOTE — ED Triage Notes (Signed)
Pt here for flu sx and cough with poor PO intake x 1 week

## 2017-02-12 NOTE — ED Provider Notes (Signed)
[facility] DEPT Provider Note   CSN: [id number] Arrival date & time: 02/12/17  1507     History   Chief Complaint Chief Complaint  Patient presents with  . Influenza    HPI Marie Lyons is a 67 y.o. female with a PMHx of COPD, chronic back pain, HLD, migraines, scoliosis, remote CVA, cardiac arrhythmia, and PSHx of abd hysterectomy, who presents to the ED with complaints of flu-like symptoms 1 week. Patient states that last week she began having fevers with Tmax 102.4, nausea and vomiting, cough with white sputum production, wheezing, SOB, body aches, diminished appetite, and mild diarrhea; however she has not had a fever in 4 days (since Friday), her nausea and vomiting have subsided with no emesis in the last 5-6 days, and her diarrhea has improved with no ongoing diarrhea today, but she continues to have body aches, gradually worsening shortness of breath, wheezing, and cough with white sputum production. She has not had much of an appetite, she has been drinking some fluids but not eating very much. She presents today due to the cough, SOB, and decreased appetite. She has tried Tylenol with minimal improvement, no known aggravating factors for her symptoms. +Sick contacts stating her son had the flu last week before she developed symptoms. She reports that she's needed 2 pillows recently due to her coughing at night. She admits to being a cigarette smoker, however hasn't been able to smoke at all this week due to her symptoms.  She denies ongoing fevers, rhinorrhea, sore throat, ear pain/drainage, hemoptysis, CP, LE swelling, recent travel/surgery/immobilization, personal/family hx of DVT/PE, abd pain, ongoing N/V/D, constipation, melena, hematochezia, hematemesis, hematuria, dysuria, arthralgias, claudication, numbness, tingling, focal weakness, or any other complaints at this time. +Estrogen patch use. Of note, states she has severe allergy to PCN but is able to take Keflex and  cephalosporins; states Cipro caused thrush but otherwise denies allergy to it; reports unknown allergy to sulfa antibiotics. Acknowledges all other allergies listed in the system as being correct, and the only other additional allergy is to [employer] (OTC nausea med). PCP is Dr. Alyson Ingles of [facility].   The history is provided by the patient and medical records. No language interpreter was used.  Influenza  Presenting symptoms: cough, diarrhea (none ongoing), fever (none in 4 days but prior Tmax 102.4), myalgias (body aches), nausea (none ongoing), shortness of breath and vomiting (none ongoing in 5-6 days)   Presenting symptoms: no rhinorrhea and no sore throat   Severity:  Moderate Onset quality:  Gradual Duration:  1 week Progression:  Unchanged Chronicity:  New Relieved by:  OTC medications Worsened by:  Nothing Ineffective treatments:  None tried Associated symptoms: decreased appetite   Associated symptoms: no ear pain and no congestion   Risk factors: sick contacts     Past Medical History:  Diagnosis Date  . Cardiac arrhythmia   . Chronic back pain   . High cholesterol   . Migraine   . Scoliosis   . Stroke [facility])     There are no active problems to display for this patient.   Past Surgical History:  Procedure Laterality Date  . ABDOMINAL HYSTERECTOMY    . BUNIONECTOMY    . ROTATOR CUFF REPAIR      OB History    No data available       Home Medications    Prior to Admission medications   Medication Sig Start Date End Date Taking? Authorizing Provider  acetaminophen (TYLENOL) 500 MG tablet Take  1,500 mg by mouth every 6 (six) hours as needed for mild pain.    Historical Provider, MD  aspirin 81 MG tablet Take 81 mg by mouth daily.    Historical Provider, MD  calcium elemental as carbonate (TUMS ULTRA 1000) 400 MG tablet Chew 1,000 mg by mouth daily.    Historical Provider, MD  cholecalciferol (VITAMIN D) 1000 UNITS tablet Take 1,000 Units by mouth daily.      Historical Provider, MD  ciprofloxacin (CIPRO) 500 MG tablet Take 500 mg by mouth 2 (two) times daily. Started on 09-21-16 for 10 days.    Historical Provider, MD  escitalopram (LEXAPRO) 10 MG tablet Take 10 mg by mouth at bedtime.    Historical Provider, MD  estradiol (VIVELLE-DOT) 0.1 MG/24HR patch Place 1 patch onto the skin every Wednesday.    Historical Provider, MD  fluticasone (FLONASE) 50 MCG/ACT nasal spray Place 2 sprays into the nose daily. 07/25/13   Roselee Culver, MD  HYDROcodone-acetaminophen (NORCO/VICODIN) 5-325 MG per tablet Take 1 tablet by mouth every 6 (six) hours as needed for moderate pain. 09/08/14   Dalia Heading, PA-C  metoprolol tartrate (LOPRESSOR) 25 MG tablet Take 25 mg by mouth at bedtime.    Historical Provider, MD  ondansetron (ZOFRAN) 4 MG tablet Take 1 tablet (4 mg total) by mouth every 6 (six) hours. 09/23/16   Recardo Evangelist, PA-C  promethazine (PHENERGAN) 25 MG tablet Take 25 mg by mouth every 6 (six) hours as needed for nausea or vomiting.    Historical Provider, MD  simvastatin (ZOCOR) 20 MG tablet Take 20 mg by mouth at bedtime.    Historical Provider, MD  vitamin C (ASCORBIC ACID) 250 MG tablet Take 250 mg by mouth daily.    Historical Provider, MD    Family History History reviewed. No pertinent family history.  Social History Social History  Substance Use Topics  . Smoking status: Current Every Day Smoker    Packs/day: 0.50    Years: 25.00    Types: Cigarettes  . Smokeless tobacco: Never Used  . Alcohol use No     Allergies   Ciprofloxacin; Penicillins; Aspirin; Darvon [propoxyphene hcl]; Ibuprofen; Imitrex [sumatriptan]; Monosodium glutamate; Nitrates, organic; Nsaids; and Sulfa antibiotics   Review of Systems Review of Systems  Constitutional: Positive for appetite change (decreased), decreased appetite and fever (none in 4 days but prior Tmax 102.4).  HENT: Negative for congestion, ear discharge, ear pain, rhinorrhea and sore  throat.   Respiratory: Positive for cough, shortness of breath and wheezing.        +2 pillow orthopnea due to cough  Cardiovascular: Negative for chest pain and leg swelling.  Gastrointestinal: Positive for diarrhea (none ongoing), nausea (none ongoing) and vomiting (none ongoing in 5-6 days). Negative for abdominal pain, blood in stool and constipation.  Genitourinary: Negative for dysuria and hematuria.  Musculoskeletal: Positive for myalgias (body aches). Negative for arthralgias.  Skin: Negative for color change.  Allergic/Immunologic: Negative for immunocompromised state.  Neurological: Negative for weakness and numbness.  Psychiatric/Behavioral: Negative for confusion.   10 Systems reviewed and are negative for acute change except as noted in the HPI.   Physical Exam Updated Vital Signs BP 107/75 (BP Location: Right Arm)   Pulse 71   Temp 98.7 F (37.1 C) (Oral)   Resp 18   SpO2 95%   Physical Exam  Constitutional: She is oriented to person, place, and time. Vital signs are normal. She appears well-developed.  Non-toxic appearance. No distress.  Afebrile, nontoxic, NAD however appears older than stated age, thin and frail appearing, appears short of breath pausing every several words to catch her breath  HENT:  Head: Normocephalic and atraumatic.  Mouth/Throat: Oropharynx is clear and moist. Mucous membranes are dry.  Mildly dry mucous membranes  Eyes: Conjunctivae and EOM are normal. Right eye exhibits no discharge. Left eye exhibits no discharge.  Neck: Normal range of motion. Neck supple.  Cardiovascular: Normal rate, regular rhythm, normal heart sounds and intact distal pulses.  Exam reveals no gallop and no friction rub.   No murmur heard. RRR, nl s1/s2, no m/r/g, distal pulses intact, no pedal edema   Pulmonary/Chest: No accessory muscle usage. No tachypnea. No respiratory distress. She has decreased breath sounds in the right lower field. She has wheezes. She has  rhonchi. She has no rales.  Appears short of breath, pausing every few words in order to take a breath, however not in distress and no tachypnea or accessory muscle usage. Audible wheezing and scattered rhonchi throughout but R>L, mildly diminished lung sounds in RLL, no rales, no hypoxia however SpO2 92-95% on RA, speaking in fragmented sentences  Abdominal: Soft. Normal appearance and bowel sounds are normal. She exhibits no distension. There is no tenderness. There is no rigidity, no rebound, no guarding, no CVA tenderness, no tenderness at McBurney's point and negative Murphy's sign.  Musculoskeletal: Normal range of motion.  MAE x4 Strength and sensation grossly intact in all extremities Distal pulses intact Gait steady No pedal edema, neg homan's bilaterally   Neurological: She is alert and oriented to person, place, and time. She has normal strength. No sensory deficit.  Skin: Skin is warm, dry and intact. No rash noted.  Psychiatric: She has a normal mood and affect.  Nursing note and vitals reviewed.    ED Treatments / Results  Labs (all labs ordered are listed, but only abnormal results are displayed) Labs Reviewed  COMPREHENSIVE METABOLIC PANEL - Abnormal; Notable for the following:       Result Value   Chloride 100 (*)    Glucose, Bld 105 (*)    Albumin 3.4 (*)    All other components within normal limits  CBC WITH DIFFERENTIAL/PLATELET  INFLUENZA PANEL BY PCR (TYPE A & B)  I-STAT TROPOININ, ED    EKG  EKG Interpretation  Date/Time:  Tuesday February 12 2017 18:47:58 EST Ventricular Rate:  72 PR Interval:    QRS Duration: 91 QT Interval:  436 QTC Calculation: 478 R Axis:   71 Text Interpretation:  Sinus rhythm Probable left atrial enlargement Nonspecific T abnormalities, lateral leads No significant change since last tracing Confirmed by KNAPP  MD-J, JON [id number]) on 02/12/2017 6:57:56 PM       Radiology Dg Chest 2 View  Result Date: 02/12/2017 CLINICAL DATA:   Cough, shortness of Breath fever for 1 week EXAM: CHEST  2 VIEW COMPARISON:  01/13/2013 FINDINGS: Cardiomediastinal silhouette is stable. Thoracolumbar scoliosis again noted. Streaky bronchitic changes or early infiltrate in right middle lobe best seen on lateral view. No pulmonary edema. IMPRESSION: No pulmonary edema. Thoracolumbar scoliosis again noted. Streaky bronchitic changes or early infiltrate best seen on lateral view. Follow-up to resolution after appropriate treatment is recommended. Electronically Signed   By: Lahoma Crocker M.D.   On: 02/12/2017 15:51    Procedures Procedures (including critical care time)  CRITICAL CARE Performed by: Reece Agar   Total critical care time: 45 minutes  Critical care time was exclusive of separately billable  procedures and treating other patients.  Critical care was necessary to treat or prevent imminent or life-threatening deterioration.  Critical care was time spent personally by me on the following activities: development of treatment plan with patient and/or surrogate as well as nursing, discussions with consultants, evaluation of patient's response to treatment, examination of patient, obtaining history from patient or surrogate, ordering and performing treatments and interventions, ordering and review of laboratory studies, ordering and review of radiographic studies, pulse oximetry and re-evaluation of patient's condition.   Medications Ordered in ED Medications  azithromycin (ZITHROMAX) 500 mg in dextrose 5 % 250 mL IVPB (500 mg Intravenous New Bag/Given 02/12/17 1939)  diphenhydrAMINE (BENADRYL) injection 12.5 mg (not administered)  sodium chloride 0.9 % bolus 1,000 mL (1,000 mLs Intravenous New Bag/Given 02/12/17 1835)  albuterol (PROVENTIL) (2.5 MG/3ML) 0.083% nebulizer solution 5 mg (5 mg Nebulization Given 02/12/17 1836)  ipratropium (ATROVENT) nebulizer solution 0.5 mg (0.5 mg Nebulization Given 02/12/17 1836)  predniSONE (DELTASONE)  tablet 60 mg (60 mg Oral Given 02/12/17 1834)  cefTRIAXone (ROCEPHIN) 1 g in dextrose 5 % 50 mL IVPB (0 g Intravenous Stopped 02/12/17 1935)  acetaminophen (TYLENOL) tablet 650 mg (650 mg Oral Given 02/12/17 1835)  albuterol (PROVENTIL) (2.5 MG/3ML) 0.083% nebulizer solution 5 mg (5 mg Nebulization Given 02/12/17 1935)  ipratropium (ATROVENT) nebulizer solution 0.5 mg (0.5 mg Nebulization Given 02/12/17 1935)     Initial Impression / Assessment and Plan / ED Course  I have reviewed the triage vital signs and the nursing notes.  Pertinent labs & imaging results that were available during my care of the patient were reviewed by me and considered in my medical decision making (see chart for details).     66 y.o. female here with cough and flu symptoms x1wk, wheezing and SOB, body aches, had n/v/d but those have resolved, had fevers but none in 4 days. Poor appetite and PO intake although trying to stay hydrated. +Sick contacts. On exam, pt speaking in fragmented sentences, appears short of breath, audible rhonchi and wheezing in all lung fields but worse on R side; mildly diminished RLL sounds. No tachycardia, SpO2 92-95% on RA, no LE swelling. +Smoker. CBC w/diff unremarkable however atypical lymphs noted; CMP WNL. CXR showing streaky bronchitic vs early infiltrate in RML, no cardiomegaly or pulmonary edema. Will get Trop and EKG, but doubt need for BNP given lack of CHF findings and obvious other etiology for her symptoms. Will give fluids, duoneb, prednisone, rocephin/azithro (pt states she's been able to take cephalosporins in the past, despite her PCN allergy). Pt requesting tylenol for her body aches. Will reassess shortly, however discussed the possibility of needing admission if she can't ambulate without desats after adequate nebulizer tx's. Discussed case with my attending Dr. Tomi Bamberger who agrees with plan.   7:13 PM Trop negative. EKG unchanged from prior and without acute ischemic findings. Lung  sounds somewhat better after duoneb, greatly improved air movement in lower fields, however still having slight wheezing throughout. Pt feeling much better already, and tolerating PO well. Will repeat duoneb to see if we can get some more improvement, then likely ambulate. Will reassess shortly  7:57 PM Lung sounds not much better after most of second duoneb done, and she actually appears more short of breath; SpO2 now 88-90% on RA, so I doubt she will be able to ambulate without further desats. Discussed admission and after long discussion, pt agreed. Will get flu swab since she's being admitted; pt itching after azithromycin so will  give benadryl. Will proceed with admission.  8:15 PM Dr. Hal Hope of [facility] returning page and will admit. Please see their notes for further documentation of care. I appreciate their help with this pleasant pt's care. Pt stable at time of admission.    Final Clinical Impressions(s) / ED Diagnoses   Final diagnoses:  Community acquired pneumonia of right middle lobe of lung ([place])  COPD exacerbation ([place])  Influenza-like illness  Decreased appetite  SOB (shortness of breath)  Tobacco user  Acute respiratory failure with hypoxia [facility])    New Prescriptions New Prescriptions   No medications on file     [address] Johniya Durfee, PA-C 02/12/17 2015    Dorie Rank, MD 02/12/17 2327

## 2017-02-12 NOTE — ED Notes (Signed)
PT placed on 2L O2 via [place] at this time. Pharmacy at bedside

## 2017-02-13 DIAGNOSIS — J1100 Influenza due to unidentified influenza virus with unspecified type of pneumonia: Secondary | ICD-10-CM

## 2017-02-13 DIAGNOSIS — Z720 Tobacco use: Secondary | ICD-10-CM

## 2017-02-13 LAB — CBC
HEMATOCRIT: 35.3 % — AB (ref 36.0–46.0)
HEMOGLOBIN: 11.7 g/dL — AB (ref 12.0–15.0)
MCH: 31.2 pg (ref 26.0–34.0)
MCHC: 33.1 g/dL (ref 30.0–36.0)
MCV: 94.1 fL (ref 78.0–100.0)
Platelets: 272 10*3/uL (ref 150–400)
RBC: 3.75 MIL/uL — AB (ref 3.87–5.11)
RDW: 13.5 % (ref 11.5–15.5)
WBC: 9.4 10*3/uL (ref 4.0–10.5)

## 2017-02-13 LAB — BASIC METABOLIC PANEL
ANION GAP: 12 (ref 5–15)
BUN: 7 mg/dL (ref 6–20)
CHLORIDE: 100 mmol/L — AB (ref 101–111)
CO2: 26 mmol/L (ref 22–32)
Calcium: 8.5 mg/dL — ABNORMAL LOW (ref 8.9–10.3)
Creatinine, Ser: 0.64 mg/dL (ref 0.44–1.00)
GFR calc Af Amer: >60 mL/min (ref >60)
Glucose, Bld: 125 mg/dL — ABNORMAL HIGH (ref 65–99)
POTASSIUM: 3.9 mmol/L (ref 3.5–5.1)
SODIUM: 138 mmol/L (ref 135–145)

## 2017-02-13 LAB — STREP PNEUMONIAE URINARY ANTIGEN: STREP PNEUMO URINARY ANTIGEN: NEGATIVE

## 2017-02-13 LAB — HIV ANTIBODY (ROUTINE TESTING W REFLEX): HIV SCREEN 4TH GENERATION: NONREACTIVE

## 2017-02-13 LAB — INFLUENZA PANEL BY PCR (TYPE A & B)
INFLAPCR: POSITIVE — AB
Influenza B By PCR: NEGATIVE

## 2017-02-13 MED ORDER — IPRATROPIUM-ALBUTEROL 0.5-2.5 (3) MG/3ML IN SOLN
3.0000 mL | Freq: Four times a day (QID) | RESPIRATORY_TRACT | Status: DC
  Administered 2017-02-13 (×3): 3 mL via RESPIRATORY_TRACT
  Filled 2017-02-13 (×3): qty 3

## 2017-02-13 MED ORDER — ENSURE ENLIVE PO LIQD
237.0000 mL | Freq: Two times a day (BID) | ORAL | Status: DC
  Administered 2017-02-13: 237 mL via ORAL

## 2017-02-13 MED ORDER — ZOLPIDEM TARTRATE 5 MG PO TABS
2.5000 mg | ORAL_TABLET | Freq: Every evening | ORAL | Status: DC | PRN
  Administered 2017-02-13: 2.5 mg via ORAL
  Filled 2017-02-13: qty 1

## 2017-02-13 MED ORDER — OSELTAMIVIR PHOSPHATE 30 MG PO CAPS
30.0000 mg | ORAL_CAPSULE | Freq: Two times a day (BID) | ORAL | Status: DC
  Administered 2017-02-13 – 2017-02-15 (×6): 30 mg via ORAL
  Filled 2017-02-13 (×11): qty 1

## 2017-02-13 MED ORDER — IPRATROPIUM-ALBUTEROL 0.5-2.5 (3) MG/3ML IN SOLN
RESPIRATORY_TRACT | Status: AC
  Filled 2017-02-13: qty 3

## 2017-02-13 NOTE — Progress Notes (Signed)
PROGRESS NOTE  Marie Lyons  [id number] DOB: [birth year]  DOA: 02/12/2017 PCP: Vena Austria, MD   Brief Narrative:  67 year old female with PMH of HTN, HLD, tobacco abuse, CVA, chronic back pain, migraine, presented to ED because of progressive dyspnea and productive cough of 4-5 days duration. Chest x-ray concerning for pneumonia and patient was hypoxic. Admitted for further evaluation and management.   Assessment & Plan:   Principal Problem:   Acute respiratory failure with hypoxia (HCC) Active Problems:   Community acquired pneumonia of right middle lobe of lung ([place])   Tobacco user   Essential hypertension   HLD (hyperlipidemia)   1. Influenza A with pneumonia: Complete 5 days of Tamiflu. Supportive treatment. 2. Acute viral bronchitis versus possible CAP: Chest x-ray shows streaky bronchitic changes or early infiltrate. Continue empirically started IV ceftriaxone and azithromycin. Follow chest x-ray in a.m. HIV antibody negative. Pneumococcal antigen negative. 3. Acute respiratory failure with hypoxia: Secondary to flu and pneumonia complicating underlying possible COPD. Wean off of oxygen as tolerated. Treat underlying causes as above. 4. Essential hypertension: Controlled. 5. Hyperlipidemia: Statins. 6. Tobacco abuse: States that she quit last week. 7. Headache: Was present this morning. Stated that she did not sleep well last night. Resolved. 8. Anemia: Follow CBCs.   DVT prophylaxis: Lovenox Code Status: Full Family Communication: None at bedside  Disposition Plan: DC home possibly in 48 hours.   Consultants:   None  Procedures:   None  Antimicrobials:   Tamiflu  Azithromycin  Ceftriaxone    Subjective: Feels better. Had headache this morning which has since resolved per RN. States that she did not sleep well last night. Dyspnea improved. Mild cough. Denies any other complaints.  Objective:  Vitals:   02/13/17 0151 02/13/17 0527  02/13/17 0903 02/13/17 1635  BP:  114/65  (!) 115/59  Pulse:  96  76  Resp:  18  (!) 21  Temp:  98.8 F (37.1 C)  98.4 F (36.9 C)  TempSrc:  Oral  Oral  SpO2: (!) 88% 95% 93% 94%  Weight:      Height:        Intake/Output Summary (Last 24 hours) at 02/13/17 1724 Last data filed at 02/13/17 1315  Gross per 24 hour  Intake             1220 ml  Output              800 ml  Net              420 ml   Filed Weights   02/12/17 2201  Weight: 47.5 kg (104 lb 12.8 oz)    Examination:  General exam: Pleasant middle-aged female, small built and thinly nourished, sitting up comfortably in bed. Respiratory system: Diminished breath sounds in the bases but otherwise clear to auscultation. Respiratory effort normal. Cardiovascular system: S1 & S2 heard, RRR. No JVD, murmurs, rubs, gallops or clicks. No pedal edema. Telemetry: Sinus rhythm. Gastrointestinal system: Abdomen is nondistended, soft and nontender. No organomegaly or masses felt. Normal bowel sounds heard. Central nervous system: Alert and oriented. No focal neurological deficits. Extremities: Symmetric 5 x 5 power. Skin: No rashes, lesions or ulcers Psychiatry: Judgement and insight appear normal. Mood & affect appropriate.     Data Reviewed: I have personally reviewed following labs and imaging studies  CBC:  Recent Labs Lab 02/12/17 1638 02/13/17 0600  WBC 7.8 9.4  NEUTROABS 5.5  --   HGB 13.6 11.7*  HCT 40.8 35.3*  MCV 94.9 94.1  PLT 275 [id number]   Basic Metabolic Panel:  Recent Labs Lab 02/12/17 1638 02/13/17 0600  NA 139 138  K 4.0 3.9  CL 100* 100*  CO2 29 26  GLUCOSE 105* 125*  BUN 7 7  CREATININE 0.67 0.64  CALCIUM 9.2 8.5*   GFR: Estimated Creatinine Clearance: 50.5 mL/min (by C-G formula based on SCr of 0.64 mg/dL). Liver Function Tests:  Recent Labs Lab 02/12/17 1638  AST 33  ALT 31  ALKPHOS 59  BILITOT 0.7  PROT 6.9  ALBUMIN 3.4*   No results for input(s): LIPASE, AMYLASE in the last  168 hours. No results for input(s): AMMONIA in the last 168 hours. Coagulation Profile: No results for input(s): INR, PROTIME in the last 168 hours. Cardiac Enzymes: No results for input(s): CKTOTAL, CKMB, CKMBINDEX, TROPONINI in the last 168 hours. BNP (last 3 results) No results for input(s): PROBNP in the last 8760 hours. HbA1C: No results for input(s): HGBA1C in the last 72 hours. CBG: No results for input(s): GLUCAP in the last 168 hours. Lipid Profile: No results for input(s): CHOL, HDL, LDLCALC, TRIG, CHOLHDL, LDLDIRECT in the last 72 hours. Thyroid Function Tests: No results for input(s): TSH, T4TOTAL, FREET4, T3FREE, THYROIDAB in the last 72 hours. Anemia Panel: No results for input(s): VITAMINB12, FOLATE, FERRITIN, TIBC, IRON, RETICCTPCT in the last 72 hours.  Sepsis Labs:  Recent Labs Lab 02/12/17 1638 02/13/17 0600  WBC 7.8 9.4    No results found for this or any previous visit (from the past 240 hour(s)).       Radiology Studies: Dg Chest 2 View  Result Date: 02/12/2017 CLINICAL DATA:  Cough, shortness of Breath fever for 1 week EXAM: CHEST  2 VIEW COMPARISON:  01/13/2013 FINDINGS: Cardiomediastinal silhouette is stable. Thoracolumbar scoliosis again noted. Streaky bronchitic changes or early infiltrate in right middle lobe best seen on lateral view. No pulmonary edema. IMPRESSION: No pulmonary edema. Thoracolumbar scoliosis again noted. Streaky bronchitic changes or early infiltrate best seen on lateral view. Follow-up to resolution after appropriate treatment is recommended. Electronically Signed   By: Lahoma Crocker M.D.   On: 02/12/2017 15:51        Scheduled Meds: . aspirin EC  81 mg Oral Daily  . azithromycin  500 mg Intravenous Q24H  . cefTRIAXone (ROCEPHIN)  IV  1 g Intravenous Q24H  . enoxaparin (LOVENOX) injection  30 mg Subcutaneous Q24H  . escitalopram  10 mg Oral QHS  . ipratropium-albuterol  3 mL Nebulization Q6H  . metoprolol succinate  25 mg  Oral QHS  . oseltamivir  30 mg Oral BID  . simvastatin  20 mg Oral QHS   Continuous Infusions:   LOS: 1 day       [facility], MD [facility] Pager [phone]  If 7PM-7AM, please contact night-coverage [email or web address] Password [facility] 02/13/2017, 5:24 PM

## 2017-02-13 NOTE — Consult Note (Signed)
[facility] CM Primary Care Navigator  02/13/2017  Marie Lyons 02-29-1950 [id number]  Met with patient at the bedside to identify possible discharge needs. Patient reports having fever, loss of appetite, weakness, nausea/vomiting that had led to this admission.  Patient endorses Dr. Maury Dus with [place] [facility] as the primary care provider.    Patient shared using [facility] to obtain medications without difficulty.   Patient reports managing her medications at home straight out of the containers.   Patient reports that she drives prior to admission. Her son [name]) or daughter [name]) will be able provide transportation to her doctors' appointments after discharge.  She reports living with her boyfriend Data processing manager) who will be her primary caregiver at home as stated.   Patient is hoping to be discharged home when stable.  Patient voiced understanding to call primary care provider's office when she returns home, for a post discharge follow-up appointment within a week or sooner if needs arise. Patient letter (with PCP's contact number) was provided as a reminder. She mentioned having a previously scheduled appointment to see PCP on 03/25/17, however, states understanding to call the office to request for an earlier appointment within 7-14 days after discharge.  Patient acknowledges need in managing COPD and Pneumonia. She expressed interest for phone calls instead of home visits. Patient verbally agreed for a referral to EMMI calls only (COPD/ PNA). [facility] referral made per patient request.  For additional questions please contact:  Edwena Felty A. Kaedan Richert, BSN, RN-BC [facility] PRIMARY CARE Navigator Cell: [phone]

## 2017-02-13 NOTE — Progress Notes (Signed)
PHARMACY NOTE:  ANTIMICROBIAL RENAL DOSAGE ADJUSTMENT  Current antimicrobial regimen includes a mismatch between antimicrobial dosage and estimated renal function.  As per policy approved by the Pharmacy & Therapeutics and Medical Executive Committees, the antimicrobial dosage will be adjusted accordingly.  Current antimicrobial dosage:  Tamiflu  Indication: influenza A  Renal Function:  Estimated Creatinine Clearance: 50.5 mL/min (by C-G formula based on SCr of 0.67 mg/dL). []      On intermittent HD, scheduled: []      On CRRT    Antimicrobial dosage has been changed to:  30mg  BID x5d     Thank you for allowing pharmacy to be a part of this patient's care.  Wynona Neat, PharmD, BCPS  02/13/2017 2:18 AM

## 2017-02-13 NOTE — Progress Notes (Signed)
Initial Nutrition Assessment  DOCUMENTATION CODES:   Not applicable  INTERVENTION:   -D/c Ensure Enlive po BID, each supplement provides 350 kcal and 20 grams of protein -Snacks TID  NUTRITION DIAGNOSIS:   Inadequate oral intake related to poor appetite as evidenced by meal completion < 50%.  GOAL:   Patient will meet greater than or equal to 90% of their needs  MONITOR:   PO intake, Labs, Weight trends, Skin, I & O's  REASON FOR ASSESSMENT:   Malnutrition Screening Tool    ASSESSMENT:   Marie Lyons is a 67 y.o. female with history of tobacco abuse, hypertension hyperlipidemia presents to the ER because of worsening shortness of breath over the last 4-5 days. Denies any chest pain. Has been having productive cough. Initially in the ER patient was found to be wheezing which improved with nebulizer treatment. Chest x-ray shows infiltrates concerning for pneumonia and patient was requiring oxygen for hypoxia.   Pt admitted with acute respiratory failure with hypoxia.   Spoke with pt at bedside, who reports poor appetite over the past week related to acute illness. She shares that "nothing tastes good" and has had a lack of desire to eat. Pt shares that she typically consumes 3 meals per day (Breakfast: egg, toast, oatmeal; Lunch: sandwich, Dinner: meat, starch, vegetable). However, pt reports she consumed only a few bites of breakfast this morning. Noted Ensure Enlive at bedside; pt reports she has tried in the past, but does not like.   Pt reports she has always been small-framed, and UBW is around 105#. She denies any recent wt loss. Per wt hx, pt has had hx of distant wt loss in the past.   Nutrition-Focused physical exam completed. Findings are no fat depletion, mild muscle depletion, and no edema.   Discussed ways pt could include additional calories and protein in diet, including consuming small, frequent high protein small/meals to promote nutritional adequacy. RD will  order between meal nourishments.   Labs reviewed.  Diet Order:  Diet Heart Room service appropriate? Yes; Fluid consistency: Thin  Skin:  Reviewed, no issues  Last BM:  02/11/17  Height:   Ht Readings from Last 1 Encounters:  02/12/17 5\' 3"  (1.6 m)    Weight:   Wt Readings from Last 1 Encounters:  02/12/17 104 lb 12.8 oz (47.5 kg)    Ideal Body Weight:  52.3 kg  BMI:  Body mass index is 18.56 kg/m.  Estimated Nutritional Needs:   Kcal:  1200-1400  Protein:  55-70 grams  Fluid:  1.2-1.4 L  EDUCATION NEEDS:   Education needs addressed  Courtnie Brenes A. Jimmye Norman, RD, LDN, CDE Pager: [phone] After hours Pager: [phone]

## 2017-02-13 NOTE — Progress Notes (Signed)
Admitted pt.from ED ,AAO X4,no acute distress ;V/S taken  & recorded .IV in place w/ occlusive dsd.intact.no redness or swelling noted.Oriented pt.to the room.call bell.& information packet given to pt.Fall assessment completed w/ pt.& able to verbalized understanding of risks associated w/ falls & to call the nurse before getting out pf bed.Call light within reach ,Skin dry & intact.Placed pt.on Tele as ordered.& on droplet precs.pending result of FLU PCR.Will continue to monitor pt.

## 2017-02-14 ENCOUNTER — Ambulatory Visit: Payer: Medicare Other | Admitting: Psychology

## 2017-02-14 ENCOUNTER — Inpatient Hospital Stay (HOSPITAL_COMMUNITY): Payer: Medicare Other

## 2017-02-14 DIAGNOSIS — E785 Hyperlipidemia, unspecified: Secondary | ICD-10-CM

## 2017-02-14 LAB — LEGIONELLA PNEUMOPHILA SEROGP 1 UR AG: L. pneumophila Serogp 1 Ur Ag: NEGATIVE

## 2017-02-14 LAB — CBC
HEMATOCRIT: 37.2 % (ref 36.0–46.0)
HEMOGLOBIN: 12.1 g/dL (ref 12.0–15.0)
MCH: 30.9 pg (ref 26.0–34.0)
MCHC: 32.5 g/dL (ref 30.0–36.0)
MCV: 94.9 fL (ref 78.0–100.0)
Platelets: 302 10*3/uL (ref 150–400)
RBC: 3.92 MIL/uL (ref 3.87–5.11)
RDW: 13.8 % (ref 11.5–15.5)
WBC: 8.9 10*3/uL (ref 4.0–10.5)

## 2017-02-14 MED ORDER — AZITHROMYCIN 500 MG PO TABS
500.0000 mg | ORAL_TABLET | ORAL | Status: DC
  Administered 2017-02-14: 500 mg via ORAL
  Filled 2017-02-14: qty 1

## 2017-02-14 MED ORDER — BUTALBITAL-APAP-CAFFEINE 50-325-40 MG PO TABS
1.0000 | ORAL_TABLET | Freq: Once | ORAL | Status: AC
  Administered 2017-02-14: 1 via ORAL
  Filled 2017-02-14: qty 1

## 2017-02-14 MED ORDER — ESTRADIOL 0.1 MG/24HR TD PTWK
0.1000 mg | MEDICATED_PATCH | TRANSDERMAL | Status: DC
  Administered 2017-02-14: 0.1 mg via TRANSDERMAL
  Filled 2017-02-14: qty 1

## 2017-02-14 MED ORDER — IPRATROPIUM-ALBUTEROL 0.5-2.5 (3) MG/3ML IN SOLN
3.0000 mL | Freq: Two times a day (BID) | RESPIRATORY_TRACT | Status: DC
  Administered 2017-02-14 (×2): 3 mL via RESPIRATORY_TRACT
  Filled 2017-02-14 (×3): qty 3

## 2017-02-14 NOTE — Progress Notes (Signed)
Pt c/o headache 7/10, unrelieved by Tylenol, pt stated Vicodin given during the day did not help. Baltazar Najjar, NP notified.

## 2017-02-14 NOTE — Progress Notes (Signed)
PROGRESS NOTE  Marie Lyons  [id number] DOB: 1950/05/27  DOA: 02/12/2017 PCP: Vena Austria, MD   Brief Narrative:  67 year old female with PMH of HTN, HLD, tobacco abuse, CVA, chronic back pain, migraine, presented to ED because of progressive dyspnea and productive cough of 4-5 days duration. Chest x-ray concerning for pneumonia and patient was hypoxic. Admitted for further evaluation and management. Improving.   Assessment & Plan:   Principal Problem:   Acute respiratory failure with hypoxia (HCC) Active Problems:   Community acquired pneumonia of right middle lobe of lung ([place])   Tobacco user   Essential hypertension   HLD (hyperlipidemia)   Influenza with pneumonia   1. Influenza A with pneumonia: Complete 5 days of Tamiflu. Supportive treatment. Improved. 2. Acute viral bronchitis versus possible CAP: Initial Chest x-ray shows streaky bronchitic changes or early infiltrate. Follow-up chest x-ray 2/15 shows persistent right middle lobe airspace disease. Continue empirically started IV ceftriaxone and azithromycin. HIV antibody negative. Pneumococcal and Legionella antigen negative. 3. Acute respiratory failure with hypoxia: Secondary to flu and pneumonia complicating underlying possible COPD. Weaned off of oxygen as tolerated. Treat underlying causes as above. Not hypoxic anymore. Did receive her dose of prednisone in the ED. 4. Essential hypertension: Controlled. 5. Hyperlipidemia: Statins. 6. Tobacco abuse: States that she quit last week. 7. Headache,chronic: She states that she has chronic morning headaches many times in the week and takes when necessary Tylenol or Vicodin. Unchanged. 8. Anemia: Stable.   DVT prophylaxis: Lovenox Code Status: Full Family Communication: None at bedside  Disposition Plan: DC home possibly 2/16   Consultants:   None  Procedures:   None  Antimicrobials:   Tamiflu  Azithromycin  Ceftriaxone     Subjective: Chronic morning headaches, unchanged from prior. Breathing improved significantly. Cough improved-not coughing as much, sputum white colored. No chest pain reported. Asking for her hormone patch-discussed with pharmacy.  Objective:  Vitals:   02/13/17 2310 02/14/17 0500 02/14/17 1054 02/14/17 1411  BP: 115/67 122/69  121/65  Pulse: 72 72    Resp: 18 18  16   Temp: 98.5 F (36.9 C) 98.3 F (36.8 C)  97.7 F (36.5 C)  TempSrc: Oral Oral  Oral  SpO2: 91% 94% 93% 98%  Weight:      Height:        Intake/Output Summary (Last 24 hours) at 02/14/17 1541 Last data filed at 02/14/17 1413  Gross per 24 hour  Intake              740 ml  Output              700 ml  Net               40 ml   Filed Weights   02/12/17 2201  Weight: 47.5 kg (104 lb 12.8 oz)    Examination:  General exam: Pleasant middle-aged female, small built and thinly nourished, sitting up comfortably in bed. Respiratory system: Diminished breath sounds in the bases but otherwise clear to auscultation. Respiratory effort normal. Cardiovascular system: S1 & S2 heard, RRR. No JVD, murmurs, rubs, gallops or clicks. No pedal edema. Telemetry: Sinus rhythm. Gastrointestinal system: Abdomen is nondistended, soft and nontender. No organomegaly or masses felt. Normal bowel sounds heard. Central nervous system: Alert and oriented. No focal neurological deficits. Extremities: Symmetric 5 x 5 power. Skin: No rashes, lesions or ulcers Psychiatry: Judgement and insight appear normal. Mood & affect appropriate.     Data Reviewed: I have personally reviewed  following labs and imaging studies  CBC:  Recent Labs Lab 02/12/17 1638 02/13/17 0600 02/14/17 0420  WBC 7.8 9.4 8.9  NEUTROABS 5.5  --   --   HGB 13.6 11.7* 12.1  HCT 40.8 35.3* 37.2  MCV 94.9 94.1 94.9  PLT 275 272 [id number]   Basic Metabolic Panel:  Recent Labs Lab 02/12/17 1638 02/13/17 0600  NA 139 138  K 4.0 3.9  CL 100* 100*  CO2 29 26   GLUCOSE 105* 125*  BUN 7 7  CREATININE 0.67 0.64  CALCIUM 9.2 8.5*   GFR: Estimated Creatinine Clearance: 50.5 mL/min (by C-G formula based on SCr of 0.64 mg/dL). Liver Function Tests:  Recent Labs Lab 02/12/17 1638  AST 33  ALT 31  ALKPHOS 59  BILITOT 0.7  PROT 6.9  ALBUMIN 3.4*   No results for input(s): LIPASE, AMYLASE in the last 168 hours. No results for input(s): AMMONIA in the last 168 hours. Coagulation Profile: No results for input(s): INR, PROTIME in the last 168 hours. Cardiac Enzymes: No results for input(s): CKTOTAL, CKMB, CKMBINDEX, TROPONINI in the last 168 hours. BNP (last 3 results) No results for input(s): PROBNP in the last 8760 hours. HbA1C: No results for input(s): HGBA1C in the last 72 hours. CBG: No results for input(s): GLUCAP in the last 168 hours. Lipid Profile: No results for input(s): CHOL, HDL, LDLCALC, TRIG, CHOLHDL, LDLDIRECT in the last 72 hours. Thyroid Function Tests: No results for input(s): TSH, T4TOTAL, FREET4, T3FREE, THYROIDAB in the last 72 hours. Anemia Panel: No results for input(s): VITAMINB12, FOLATE, FERRITIN, TIBC, IRON, RETICCTPCT in the last 72 hours.  Sepsis Labs:  Recent Labs Lab 02/12/17 1638 02/13/17 0600 02/14/17 0420  WBC 7.8 9.4 8.9    No results found for this or any previous visit (from the past 240 hour(s)).       Radiology Studies: Dg Chest 2 View  Result Date: 02/14/2017 CLINICAL DATA:  Pneumonia and  short of breath EXAM: CHEST  2 VIEW COMPARISON:  02/12/2017 FINDINGS: Pulmonary hyperinflation compatible with COPD. Right middle lobe airspace disease has resolved since the prior study and may have been atelectasis. Negative for heart failure or effusion. Thoracic scoliosis unchanged. IMPRESSION: No resolution of right middle lobe airspace disease, likely atelectasis versus pneumonia. Electronically Signed   By: Franchot Gallo M.D.   On: 02/14/2017 08:01   Dg Chest 2 View  Result Date:  02/12/2017 CLINICAL DATA:  Cough, shortness of Breath fever for 1 week EXAM: CHEST  2 VIEW COMPARISON:  01/13/2013 FINDINGS: Cardiomediastinal silhouette is stable. Thoracolumbar scoliosis again noted. Streaky bronchitic changes or early infiltrate in right middle lobe best seen on lateral view. No pulmonary edema. IMPRESSION: No pulmonary edema. Thoracolumbar scoliosis again noted. Streaky bronchitic changes or early infiltrate best seen on lateral view. Follow-up to resolution after appropriate treatment is recommended. Electronically Signed   By: Lahoma Crocker M.D.   On: 02/12/2017 15:51        Scheduled Meds: . aspirin EC  81 mg Oral Daily  . azithromycin  500 mg Oral Q24H  . cefTRIAXone (ROCEPHIN)  IV  1 g Intravenous Q24H  . enoxaparin (LOVENOX) injection  30 mg Subcutaneous Q24H  . escitalopram  10 mg Oral QHS  . estradiol  0.1 mg Transdermal Weekly  . ipratropium-albuterol  3 mL Nebulization BID  . metoprolol succinate  25 mg Oral QHS  . oseltamivir  30 mg Oral BID  . simvastatin  20 mg Oral QHS  Continuous Infusions:   LOS: 2 days       [facility], MD [facility] Pager [phone]  If 7PM-7AM, please contact night-coverage [email or web address] Password [facility] 02/14/2017, 3:41 PM

## 2017-02-15 DIAGNOSIS — J9601 Acute respiratory failure with hypoxia: Secondary | ICD-10-CM

## 2017-02-15 DIAGNOSIS — J449 Chronic obstructive pulmonary disease, unspecified: Secondary | ICD-10-CM

## 2017-02-15 DIAGNOSIS — J181 Lobar pneumonia, unspecified organism: Secondary | ICD-10-CM

## 2017-02-15 DIAGNOSIS — J441 Chronic obstructive pulmonary disease with (acute) exacerbation: Secondary | ICD-10-CM

## 2017-02-15 MED ORDER — CEFDINIR 300 MG PO CAPS: 300.0000 mg | ORAL_CAPSULE | Freq: Two times a day (BID) | ORAL | 0 refills | Status: AC

## 2017-02-15 MED ORDER — METHOCARBAMOL 500 MG PO TABS: 500.0000 mg | ORAL_TABLET | Freq: Four times a day (QID) | ORAL | 0 refills | Status: DC

## 2017-02-15 MED ORDER — OSELTAMIVIR PHOSPHATE 30 MG PO CAPS: 30.0000 mg | ORAL_CAPSULE | Freq: Two times a day (BID) | ORAL | 0 refills | Status: DC

## 2017-02-15 MED ORDER — CEFDINIR 300 MG PO CAPS: 300.0000 mg | ORAL_CAPSULE | Freq: Two times a day (BID) | ORAL | 0 refills | Status: DC

## 2017-02-15 MED ORDER — AZITHROMYCIN 500 MG PO TABS: 500.0000 mg | ORAL_TABLET | ORAL | 0 refills | Status: AC

## 2017-02-15 MED ORDER — OSELTAMIVIR PHOSPHATE 30 MG PO CAPS: 30.0000 mg | ORAL_CAPSULE | Freq: Two times a day (BID) | ORAL | 0 refills | Status: AC

## 2017-02-15 NOTE — Progress Notes (Signed)
Pt c/o her heart racing upon waking up, VS stable, pt placed on 2 L O2. She feels better now. Baltazar Najjar, NP notified.

## 2017-02-15 NOTE — Discharge Summary (Signed)
Physician Discharge Summary  Marie Lyons MRN: [id number] DOB/AGE: March 12, 1950 67 y.o.  PCP: Vena Austria, MD   Admit date: 02/12/2017 Discharge date: 02/15/2017  Discharge Diagnoses:    Principal Problem:   Acute respiratory failure with hypoxia [facility]) Active Problems:   Community acquired pneumonia of right middle lobe of lung ([place])   Tobacco user   Essential hypertension   HLD (hyperlipidemia)   Influenza with pneumonia    Follow-up recommendations Follow-up with PCP in 3-5 days , including all  additional recommended appointments as below Follow-up CBC, CMP in 3-5 days      Current Discharge Medication List    START taking these medications   Details  azithromycin (ZITHROMAX) 500 MG tablet Take 1 tablet (500 mg total) by mouth daily. Qty: 3 tablet, Refills: 0    cefdinir (OMNICEF) 300 MG capsule Take 1 capsule (300 mg total) by mouth 2 (two) times daily. Qty: 6 capsule, Refills: 0    methocarbamol (ROBAXIN) 500 MG tablet Take 1 tablet (500 mg total) by mouth 4 (four) times daily. Qty: 30 tablet, Refills: 0    oseltamivir (TAMIFLU) 30 MG capsule Take 1 capsule (30 mg total) by mouth 2 (two) times daily. Qty: 4 capsule, Refills: 0      CONTINUE these medications which have NOT CHANGED   Details  acetaminophen (TYLENOL) 500 MG tablet Take 500 mg by mouth every 6 (six) hours as needed for headache (or pain).     aspirin 81 MG tablet Take 81 mg by mouth daily.    calcium elemental as carbonate (TUMS ULTRA 1000) 400 MG tablet Chew 400-800 mg by mouth daily as needed for heartburn.     cholecalciferol (VITAMIN D) 1000 UNITS tablet Take 1,000 Units by mouth daily.     escitalopram (LEXAPRO) 10 MG tablet Take 10 mg by mouth at bedtime.    estradiol (VIVELLE-DOT) 0.1 MG/24HR patch Place 1 patch onto the skin every Wednesday.    HYDROcodone-acetaminophen (NORCO/VICODIN) 5-325 MG per tablet Take 1 tablet by mouth every 6 (six) hours as needed for moderate  pain. Qty: 20 tablet, Refills: 0    MELATONIN PO Take 1 tablet by mouth at bedtime.    metoprolol succinate (TOPROL-XL) 25 MG 24 hr tablet Take 25 mg by mouth at bedtime.    simvastatin (ZOCOR) 20 MG tablet Take 20 mg by mouth at bedtime.    vitamin C (ASCORBIC ACID) 250 MG tablet Take 250 mg by mouth daily.    fluticasone (FLONASE) 50 MCG/ACT nasal spray Place 2 sprays into the nose daily. Qty: 16 g, Refills: 12    ondansetron (ZOFRAN) 4 MG tablet Take 1 tablet (4 mg total) by mouth every 6 (six) hours. Qty: 12 tablet, Refills: 0           Discharge Condition: STABLE   Discharge Instructions Get Medicines reviewed and adjusted: Please take all your medications with you for your next visit with your Primary MD  Please request your Primary MD to go over all hospital tests and procedure/radiological results at the follow up, please ask your Primary MD to get all Hospital records sent to his/her office.  If you experience worsening of your admission symptoms, develop shortness of breath, life threatening emergency, suicidal or homicidal thoughts you must seek medical attention immediately by calling 911 or calling your MD immediately if symptoms less severe.  You must read complete instructions/literature along with all the possible adverse reactions/side effects for all the Medicines you take and that have  been prescribed to you. Take any new Medicines after you have completely understood and accpet all the possible adverse reactions/side effects.   Do not drive when taking Pain medications.   Do not take more than prescribed Pain, Sleep and Anxiety Medications  Special Instructions: If you have smoked or chewed Tobacco in the last 2 yrs please stop smoking, stop any regular Alcohol and or any Recreational drug use.  Wear Seat belts while driving.  Please note  You were cared for by a hospitalist during your hospital stay. Once you are discharged, your primary care  physician will handle any further medical issues. Please note that NO REFILLS for any discharge medications will be authorized once you are discharged, as it is imperative that you return to your primary care physician (or establish a relationship with a primary care physician if you do not have one) for your aftercare needs so that they can reassess your need for medications and monitor your lab values.     Allergies  Allergen Reactions  . Ciprofloxacin Shortness Of Breath    Denies this allergy on 09/23/16 (??)  . Penicillins Anaphylaxis    Has patient had a PCN reaction causing immediate rash, facial/tongue/throat swelling, SOB or lightheadedness with hypotension: Yes Has patient had a PCN reaction causing severe rash involving mucus membranes or skin necrosis: Yes Has patient had a PCN reaction that required hospitalization Yes Has patient had a PCN reaction occurring within the last 10 years: No If all of the above answers are "NO", then may proceed with Cephalosporin use.   . Aspirin Other (See Comments)    Aggravates ulcer  . Darvon [Propoxyphene Hcl] Other (See Comments)    Wheezing  . Ibuprofen Other (See Comments)    Wheezing  . Imitrex [Sumatriptan] Other (See Comments)    Contraindicated because pt had a stroke  . Meperidine Other (See Comments)    Unknown reaction (per patient)  . Monosodium Glutamate Other (See Comments)    Migraine   . Nitrates, Organic Other (See Comments)    Migraine   . Nsaids Other (See Comments)    Aggravates ulcer  . Sulfa Antibiotics Other (See Comments)    Unknown reaction (per patient)      Disposition: 01-Home or Self Care   Consults:  NONE     Significant Diagnostic Studies:  Dg Chest 2 View  Result Date: 02/14/2017 CLINICAL DATA:  Pneumonia and  short of breath EXAM: CHEST  2 VIEW COMPARISON:  02/12/2017 FINDINGS: Pulmonary hyperinflation compatible with COPD. Right middle lobe airspace disease has resolved since the prior  study and may have been atelectasis. Negative for heart failure or effusion. Thoracic scoliosis unchanged. IMPRESSION: No resolution of right middle lobe airspace disease, likely atelectasis versus pneumonia. Electronically Signed   By: Franchot Gallo M.D.   On: 02/14/2017 08:01   Dg Chest 2 View  Result Date: 02/12/2017 CLINICAL DATA:  Cough, shortness of Breath fever for 1 week EXAM: CHEST  2 VIEW COMPARISON:  01/13/2013 FINDINGS: Cardiomediastinal silhouette is stable. Thoracolumbar scoliosis again noted. Streaky bronchitic changes or early infiltrate in right middle lobe best seen on lateral view. No pulmonary edema. IMPRESSION: No pulmonary edema. Thoracolumbar scoliosis again noted. Streaky bronchitic changes or early infiltrate best seen on lateral view. Follow-up to resolution after appropriate treatment is recommended. Electronically Signed   By: Lahoma Crocker M.D.   On: 02/12/2017 15:51      Filed Weights   02/12/17 2201  Weight: 47.5 kg (104  lb 12.8 oz)     Microbiology: No results found for this or any previous visit (from the past 240 hour(s)).     Blood Culture    Component Value Date/Time   SDES URINE, CLEAN CATCH 09/09/2012 2046   [place] NONE 09/09/2012 2046   CULT  09/09/2012 2046    LACTOBACILLUS SPECIES Note: Standardized susceptibility testing for this organism is not available.   REPTSTATUS 09/11/2012 FINAL 09/09/2012 2046      Labs: Results for orders placed or performed during the hospital encounter of 02/12/17 (from the past 48 hour(s))  CBC     Status: None   Collection Time: 02/14/17  4:20 AM  Result Value Ref Range   WBC 8.9 4.0 - 10.5 K/uL   RBC 3.92 3.87 - 5.11 MIL/uL   Hemoglobin 12.1 12.0 - 15.0 g/dL   HCT 37.2 36.0 - 46.0 %   MCV 94.9 78.0 - 100.0 fL   MCH 30.9 26.0 - 34.0 pg   MCHC 32.5 30.0 - 36.0 g/dL   RDW 13.8 11.5 - 15.5 %   Platelets 302 150 - 400 K/uL      HPI :*  HPI: Marie Lyons is a 67 y.o. female with history of  tobacco abuse, hypertension hyperlipidemia presents to the ER because of worsening shortness of breath over the last 4-5 days. Denies any chest pain. Has been having productive cough. Initially in the ER patient was found to be wheezing which improved with nebulizer treatment. Chest x-ray shows infiltrates concerning for pneumonia and patient was requiring oxygen for hypoxia.  HOSPITAL COURSE: *  1. Influenza A with pneumonia: Complete 3 days of Tamiflu. Needs 2 more days on  tamiflu , dosing Based on creatinine clearance . 2. Acute viral bronchitis versus possible CAP: Initial Chest x-ray shows streaky bronchitic changes or early infiltrate. Follow-up chest x-ray 2/15 shows persistent right middle lobe airspace disease. started on   IV ceftriaxone and azithromycin. Now changed to [facility] and azithromycin. HIV antibody negative. Pneumococcal and Legionella antigen negative. Patient has  Mostly not  required oxygen since 2/14 and has between 92- 100% on room air. 3. Acute respiratory failure with hypoxia: Secondary to flu and pneumonia complicating underlying possible COPD. patient has been on room air for 72 hours.. Treat underlying causes as above. Not hypoxic anymore. Did receive her dose of prednisone in the ED. 4. Essential hypertension: Controlled. 5. Hyperlipidemia: Statins. 6. Tobacco abuse: States that she quit last week. 7. Headache,chronic: She states that she has chronic morning headaches many times in the week and takes when necessary Tylenol or Vicodin.   8. Anemia: Stable.   Discharge Exam:   Blood pressure 126/76, pulse 67, temperature 98.3 F (36.8 C), temperature source Oral, resp. rate 18, height 5\' 3"  (1.6 m), weight 47.5 kg (104 lb 12.8 oz), SpO2 100 %.  Cardiovascular: S1 and S2 heard no murmurs appreciated. Abdomen: Soft nontender bowel sounds present. No guarding or rigidity. Musculoskeletal: No edema no joint effusion. Skin: No rash skin appears warm. Neurologic: Alert  awake oriented to time place and person. Moves all extremities. Psychiatric: Appears normal. Normal affect.      SignedReyne Dumas 02/15/2017, 8:56 AM        Time spent >45 mins

## 2017-02-15 NOTE — Progress Notes (Signed)
Pt discharged to home. Pt. Is alert and oriented. Pt is hemodynamically stable. AVS reviewed with pt. Capable of re verbalizing medication regimen. Discharge plan appropriate and in place. 

## 2017-02-28 ENCOUNTER — Ambulatory Visit: Payer: Medicare Other | Admitting: Psychology

## 2017-03-14 ENCOUNTER — Ambulatory Visit: Payer: Medicare Other | Admitting: Psychology

## 2017-03-25 DIAGNOSIS — F324 Major depressive disorder, single episode, in partial remission: Secondary | ICD-10-CM | POA: Diagnosis not present

## 2017-03-25 DIAGNOSIS — R32 Unspecified urinary incontinence: Secondary | ICD-10-CM | POA: Diagnosis not present

## 2017-03-25 DIAGNOSIS — E46 Unspecified protein-calorie malnutrition: Secondary | ICD-10-CM | POA: Diagnosis not present

## 2017-03-25 DIAGNOSIS — D692 Other nonthrombocytopenic purpura: Secondary | ICD-10-CM | POA: Diagnosis not present

## 2017-03-25 DIAGNOSIS — M545 Low back pain: Secondary | ICD-10-CM | POA: Diagnosis not present

## 2017-03-25 DIAGNOSIS — F17200 Nicotine dependence, unspecified, uncomplicated: Secondary | ICD-10-CM | POA: Diagnosis not present

## 2017-03-25 DIAGNOSIS — N390 Urinary tract infection, site not specified: Secondary | ICD-10-CM | POA: Diagnosis not present

## 2017-03-25 DIAGNOSIS — M62838 Other muscle spasm: Secondary | ICD-10-CM | POA: Diagnosis not present

## 2017-03-25 DIAGNOSIS — I10 Essential (primary) hypertension: Secondary | ICD-10-CM | POA: Diagnosis not present

## 2017-03-25 DIAGNOSIS — M8580 Other specified disorders of bone density and structure, unspecified site: Secondary | ICD-10-CM | POA: Diagnosis not present

## 2017-03-25 DIAGNOSIS — E7800 Pure hypercholesterolemia, unspecified: Secondary | ICD-10-CM | POA: Diagnosis not present

## 2017-04-08 ENCOUNTER — Other Ambulatory Visit: Payer: Self-pay | Admitting: Family Medicine

## 2017-04-08 DIAGNOSIS — Z1231 Encounter for screening mammogram for malignant neoplasm of breast: Secondary | ICD-10-CM

## 2017-05-23 ENCOUNTER — Ambulatory Visit (INDEPENDENT_AMBULATORY_CARE_PROVIDER_SITE_OTHER): Payer: Medicare Other | Admitting: Psychology

## 2017-05-23 DIAGNOSIS — F4323 Adjustment disorder with mixed anxiety and depressed mood: Secondary | ICD-10-CM | POA: Diagnosis not present

## 2017-06-21 ENCOUNTER — Ambulatory Visit: Payer: Medicare Other | Admitting: Psychology

## 2017-07-04 ENCOUNTER — Ambulatory Visit (INDEPENDENT_AMBULATORY_CARE_PROVIDER_SITE_OTHER): Payer: Medicare Other | Admitting: Psychology

## 2017-07-04 DIAGNOSIS — F4323 Adjustment disorder with mixed anxiety and depressed mood: Secondary | ICD-10-CM | POA: Diagnosis not present

## 2017-07-18 ENCOUNTER — Ambulatory Visit (INDEPENDENT_AMBULATORY_CARE_PROVIDER_SITE_OTHER): Payer: Medicare Other | Admitting: Psychology

## 2017-07-18 DIAGNOSIS — F4323 Adjustment disorder with mixed anxiety and depressed mood: Secondary | ICD-10-CM

## 2017-08-15 ENCOUNTER — Ambulatory Visit: Payer: Medicare Other | Admitting: Psychology

## 2017-08-29 ENCOUNTER — Ambulatory Visit (INDEPENDENT_AMBULATORY_CARE_PROVIDER_SITE_OTHER): Payer: Medicare Other | Admitting: Psychology

## 2017-08-29 DIAGNOSIS — F4323 Adjustment disorder with mixed anxiety and depressed mood: Secondary | ICD-10-CM | POA: Diagnosis not present

## 2017-09-10 DIAGNOSIS — Z23 Encounter for immunization: Secondary | ICD-10-CM | POA: Diagnosis not present

## 2017-09-12 ENCOUNTER — Ambulatory Visit: Payer: Medicare Other | Admitting: Psychology

## 2017-09-26 ENCOUNTER — Ambulatory Visit (INDEPENDENT_AMBULATORY_CARE_PROVIDER_SITE_OTHER): Payer: Medicare Other | Admitting: Psychology

## 2017-09-26 DIAGNOSIS — F4323 Adjustment disorder with mixed anxiety and depressed mood: Secondary | ICD-10-CM | POA: Diagnosis not present

## 2017-10-10 ENCOUNTER — Ambulatory Visit: Payer: Medicare Other | Admitting: Psychology

## 2017-10-17 DIAGNOSIS — I10 Essential (primary) hypertension: Secondary | ICD-10-CM | POA: Diagnosis not present

## 2017-10-17 DIAGNOSIS — E46 Unspecified protein-calorie malnutrition: Secondary | ICD-10-CM | POA: Diagnosis not present

## 2017-10-17 DIAGNOSIS — N3000 Acute cystitis without hematuria: Secondary | ICD-10-CM | POA: Diagnosis not present

## 2017-10-17 DIAGNOSIS — R32 Unspecified urinary incontinence: Secondary | ICD-10-CM | POA: Diagnosis not present

## 2017-10-17 DIAGNOSIS — M545 Low back pain: Secondary | ICD-10-CM | POA: Diagnosis not present

## 2017-10-17 DIAGNOSIS — M62838 Other muscle spasm: Secondary | ICD-10-CM | POA: Diagnosis not present

## 2017-10-17 DIAGNOSIS — D692 Other nonthrombocytopenic purpura: Secondary | ICD-10-CM | POA: Diagnosis not present

## 2017-10-17 DIAGNOSIS — G44209 Tension-type headache, unspecified, not intractable: Secondary | ICD-10-CM | POA: Diagnosis not present

## 2017-10-17 DIAGNOSIS — R8290 Unspecified abnormal findings in urine: Secondary | ICD-10-CM | POA: Diagnosis not present

## 2017-10-17 DIAGNOSIS — M8580 Other specified disorders of bone density and structure, unspecified site: Secondary | ICD-10-CM | POA: Diagnosis not present

## 2017-10-17 DIAGNOSIS — Z0000 Encounter for general adult medical examination without abnormal findings: Secondary | ICD-10-CM | POA: Diagnosis not present

## 2017-10-17 DIAGNOSIS — F324 Major depressive disorder, single episode, in partial remission: Secondary | ICD-10-CM | POA: Diagnosis not present

## 2017-10-17 DIAGNOSIS — E7800 Pure hypercholesterolemia, unspecified: Secondary | ICD-10-CM | POA: Diagnosis not present

## 2017-10-24 ENCOUNTER — Ambulatory Visit: Payer: Medicare Other | Admitting: Psychology

## 2017-11-07 ENCOUNTER — Ambulatory Visit (INDEPENDENT_AMBULATORY_CARE_PROVIDER_SITE_OTHER): Payer: Medicare Other | Admitting: Psychology

## 2017-11-07 DIAGNOSIS — F4323 Adjustment disorder with mixed anxiety and depressed mood: Secondary | ICD-10-CM

## 2017-12-03 DIAGNOSIS — S61201A Unspecified open wound of left index finger without damage to nail, initial encounter: Secondary | ICD-10-CM | POA: Diagnosis not present

## 2017-12-10 DIAGNOSIS — S61211D Laceration without foreign body of left index finger without damage to nail, subsequent encounter: Secondary | ICD-10-CM | POA: Diagnosis not present

## 2017-12-19 ENCOUNTER — Ambulatory Visit: Payer: Medicare Other | Admitting: Psychology

## 2018-01-02 ENCOUNTER — Ambulatory Visit: Payer: Self-pay | Admitting: Psychology

## 2018-01-04 ENCOUNTER — Emergency Department (HOSPITAL_COMMUNITY): Payer: Medicare Other

## 2018-01-04 ENCOUNTER — Emergency Department (HOSPITAL_COMMUNITY)
Admission: EM | Admit: 2018-01-04 | Discharge: 2018-01-05 | Disposition: A | Discharge status: Other Acute Inpatient Hospital | Payer: Medicare Other | Attending: Emergency Medicine | Admitting: Emergency Medicine

## 2018-01-04 ENCOUNTER — Encounter (HOSPITAL_COMMUNITY): Payer: Self-pay | Admitting: Nurse Practitioner

## 2018-01-04 DIAGNOSIS — Z630 Problems in relationship with spouse or partner: Secondary | ICD-10-CM | POA: Diagnosis not present

## 2018-01-04 DIAGNOSIS — F17210 Nicotine dependence, cigarettes, uncomplicated: Secondary | ICD-10-CM | POA: Insufficient documentation

## 2018-01-04 DIAGNOSIS — R45851 Suicidal ideations: Secondary | ICD-10-CM | POA: Diagnosis not present

## 2018-01-04 DIAGNOSIS — Z91410 Personal history of adult physical and sexual abuse: Secondary | ICD-10-CM | POA: Diagnosis not present

## 2018-01-04 DIAGNOSIS — I10 Essential (primary) hypertension: Secondary | ICD-10-CM | POA: Diagnosis not present

## 2018-01-04 DIAGNOSIS — J449 Chronic obstructive pulmonary disease, unspecified: Secondary | ICD-10-CM | POA: Insufficient documentation

## 2018-01-04 DIAGNOSIS — F332 Major depressive disorder, recurrent severe without psychotic features: Secondary | ICD-10-CM | POA: Insufficient documentation

## 2018-01-04 DIAGNOSIS — Z79899 Other long term (current) drug therapy: Secondary | ICD-10-CM | POA: Insufficient documentation

## 2018-01-04 DIAGNOSIS — Z046 Encounter for general psychiatric examination, requested by authority: Secondary | ICD-10-CM | POA: Diagnosis not present

## 2018-01-04 DIAGNOSIS — F329 Major depressive disorder, single episode, unspecified: Secondary | ICD-10-CM | POA: Diagnosis present

## 2018-01-04 DIAGNOSIS — F419 Anxiety disorder, unspecified: Secondary | ICD-10-CM | POA: Diagnosis not present

## 2018-01-04 DIAGNOSIS — R450 Nervousness: Secondary | ICD-10-CM | POA: Diagnosis not present

## 2018-01-04 DIAGNOSIS — Z7982 Long term (current) use of aspirin: Secondary | ICD-10-CM | POA: Insufficient documentation

## 2018-01-04 DIAGNOSIS — R4182 Altered mental status, unspecified: Secondary | ICD-10-CM | POA: Diagnosis not present

## 2018-01-04 LAB — RAPID URINE DRUG SCREEN, HOSP PERFORMED
AMPHETAMINES: NOT DETECTED
BENZODIAZEPINES: NOT DETECTED
Barbiturates: NOT DETECTED
Cocaine: NOT DETECTED
OPIATES: NOT DETECTED
Tetrahydrocannabinol: NOT DETECTED

## 2018-01-04 LAB — SALICYLATE LEVEL: Salicylate Lvl: <7 mg/dL (ref 2.8–30.0)

## 2018-01-04 LAB — COMPREHENSIVE METABOLIC PANEL
ALBUMIN: 4 g/dL (ref 3.5–5.0)
ALK PHOS: 74 U/L (ref 38–126)
ALT: 21 U/L (ref 14–54)
AST: 25 U/L (ref 15–41)
Anion gap: 6 (ref 5–15)
BILIRUBIN TOTAL: 0.1 mg/dL — AB (ref 0.3–1.2)
BUN: 9 mg/dL (ref 6–20)
CALCIUM: 9.1 mg/dL (ref 8.9–10.3)
CO2: 29 mmol/L (ref 22–32)
Chloride: 103 mmol/L (ref 101–111)
Creatinine, Ser: 0.65 mg/dL (ref 0.44–1.00)
GFR calc Af Amer: >60 mL/min (ref >60)
GFR calc non Af Amer: >60 mL/min (ref >60)
GLUCOSE: 91 mg/dL (ref 65–99)
POTASSIUM: 4.7 mmol/L (ref 3.5–5.1)
Sodium: 138 mmol/L (ref 135–145)
TOTAL PROTEIN: 7.2 g/dL (ref 6.5–8.1)

## 2018-01-04 LAB — CBC WITH DIFFERENTIAL/PLATELET
BASOS ABS: 0.1 10*3/uL (ref 0.0–0.1)
BASOS PCT: 1 %
Eosinophils Absolute: 0.3 10*3/uL (ref 0.0–0.7)
Eosinophils Relative: 3 %
HEMATOCRIT: 43.8 % (ref 36.0–46.0)
Hemoglobin: 14.6 g/dL (ref 12.0–15.0)
Lymphocytes Relative: 24 %
Lymphs Abs: 2.4 10*3/uL (ref 0.7–4.0)
MCH: 33.3 pg (ref 26.0–34.0)
MCHC: 33.3 g/dL (ref 30.0–36.0)
MCV: 99.8 fL (ref 78.0–100.0)
Monocytes Absolute: 0.6 10*3/uL (ref 0.1–1.0)
Monocytes Relative: 6 %
NEUTROS ABS: 6.7 10*3/uL (ref 1.7–7.7)
NEUTROS PCT: 66 %
Platelets: 235 10*3/uL (ref 150–400)
RBC: 4.39 MIL/uL (ref 3.87–5.11)
RDW: 14.2 % (ref 11.5–15.5)
WBC: 10 10*3/uL (ref 4.0–10.5)

## 2018-01-04 LAB — ACETAMINOPHEN LEVEL: Acetaminophen (Tylenol), Serum: <10 ug/mL — ABNORMAL LOW (ref 10–30)

## 2018-01-04 LAB — ETHANOL: Alcohol, Ethyl (B): <10 mg/dL (ref <10)

## 2018-01-04 MED ORDER — SIMVASTATIN 20 MG PO TABS
20.0000 mg | ORAL_TABLET | Freq: Every day | ORAL | Status: DC
  Administered 2018-01-04: 20 mg via ORAL
  Filled 2018-01-04 (×2): qty 1

## 2018-01-04 MED ORDER — ESCITALOPRAM OXALATE 10 MG PO TABS
20.0000 mg | ORAL_TABLET | Freq: Every day | ORAL | Status: DC
Start: 2018-01-04 — End: 2018-01-05
  Administered 2018-01-04 – 2018-01-05 (×2): 20 mg via ORAL
  Filled 2018-01-04 (×2): qty 2

## 2018-01-04 MED ORDER — CYCLOBENZAPRINE HCL 10 MG PO TABS
5.0000 mg | ORAL_TABLET | Freq: Every evening | ORAL | Status: DC | PRN
  Administered 2018-01-05: 10 mg via ORAL
  Filled 2018-01-04: qty 1

## 2018-01-04 MED ORDER — METOPROLOL SUCCINATE ER 25 MG PO TB24
25.0000 mg | ORAL_TABLET | Freq: Every day | ORAL | Status: DC
  Administered 2018-01-04: 25 mg via ORAL
  Filled 2018-01-04 (×2): qty 1

## 2018-01-04 MED ORDER — LORAZEPAM 1 MG PO TABS
1.0000 mg | ORAL_TABLET | Freq: Four times a day (QID) | ORAL | Status: DC | PRN
  Administered 2018-01-05 (×2): 1 mg via ORAL
  Filled 2018-01-04 (×2): qty 1

## 2018-01-04 MED ORDER — ACETAMINOPHEN 500 MG PO TABS
500.0000 mg | ORAL_TABLET | Freq: Four times a day (QID) | ORAL | Status: DC | PRN
  Administered 2018-01-04 – 2018-01-05 (×3): 500 mg via ORAL
  Filled 2018-01-04 (×3): qty 1

## 2018-01-04 MED ORDER — MELATONIN 3 MG PO TABS: ORAL_TABLET | Freq: Every day | ORAL | Status: DC

## 2018-01-04 NOTE — ED Provider Notes (Signed)
[place] DEPT Provider Note   CSN: [id number] Arrival date & time: 01/04/18  1516     History   Chief Complaint No chief complaint on file.   HPI Marie Lyons is a 68 y.o. female hx of HL, stroke, here with depression, suicidal ideation. Patient states that she gets depressed during the holiday season since her father died around Christmas time when she was 61. She states that her mother died several years ago and she has been more anxious and depressed and is on meds for anxiety. She states that her storage unit was recently auctioned and she lost a lot of furniture that is used by her parents and has been sad about it. She states that she is better off leaving and go to heaven than being here. She was IVC by police.   The history is provided by the patient.    Past Medical History:  Diagnosis Date  . Cardiac arrhythmia   . Chronic back pain   . High cholesterol   . Migraine   . Scoliosis   . Stroke [facility])     Patient Active Problem List   Diagnosis Date Noted  . COPD exacerbation ([place])   . Influenza with pneumonia   . Community acquired pneumonia of right middle lobe of lung ([place]) 02/12/2017  . Acute respiratory failure with hypoxia ([place]) 02/12/2017  . Tobacco user 02/12/2017  . Essential hypertension 02/12/2017  . HLD (hyperlipidemia) 02/12/2017    Past Surgical History:  Procedure Laterality Date  . ABDOMINAL HYSTERECTOMY    . BUNIONECTOMY    . ROTATOR CUFF REPAIR      OB History    No data available       Home Medications    Prior to Admission medications   Medication Sig Start Date End Date Taking? Authorizing Provider  acetaminophen (TYLENOL) 500 MG tablet Take 500 mg by mouth every 6 (six) hours as needed for headache (or pain).     [provider]  aspirin 81 MG tablet Take 81 mg by mouth daily.    [provider]  calcium elemental as carbonate (TUMS ULTRA 1000) 400 MG tablet Chew 400-800 mg by  mouth daily as needed for heartburn.     [provider]  cholecalciferol (VITAMIN D) 1000 UNITS tablet Take 1,000 Units by mouth daily.     [provider]  escitalopram (LEXAPRO) 10 MG tablet Take 10 mg by mouth at bedtime.    [provider]  estradiol (VIVELLE-DOT) 0.1 MG/24HR patch Place 1 patch onto the skin every Wednesday.    [provider]  fluticasone (FLONASE) 50 MCG/ACT nasal spray Place 2 sprays into the nose daily. Patient not taking: Reported on 02/12/2017 07/25/13   Roselee Culver, MD  HYDROcodone-acetaminophen (NORCO/VICODIN) 5-325 MG per tablet Take 1 tablet by mouth every 6 (six) hours as needed for moderate pain. 09/08/14   Lawyer, Harrell Gave, PA-C  MELATONIN PO Take 1 tablet by mouth at bedtime.    [provider]  methocarbamol (ROBAXIN) 500 MG tablet Take 1 tablet (500 mg total) by mouth 4 (four) times daily. 02/15/17   Reyne Dumas, MD  metoprolol succinate (TOPROL-XL) 25 MG 24 hr tablet Take 25 mg by mouth at bedtime.    [provider]  ondansetron (ZOFRAN) 4 MG tablet Take 1 tablet (4 mg total) by mouth every 6 (six) hours. Patient not taking: Reported on 02/12/2017 09/23/16   Recardo Evangelist, PA-C  simvastatin (ZOCOR) 20  MG tablet Take 20 mg by mouth at bedtime.    [provider]  vitamin C (ASCORBIC ACID) 250 MG tablet Take 250 mg by mouth daily.    [provider]    Family History Family History  Problem Relation Age of Onset  . CAD Father   . Stroke Neg Hx     Social History Social History   Tobacco Use  . Smoking status: Current Every Day Smoker    Packs/day: 0.50    Years: 25.00    Pack years: 12.50    Types: Cigarettes  . Smokeless tobacco: Never Used  Substance Use Topics  . Alcohol use: No  . Drug use: No     Allergies   Ciprofloxacin; Penicillins; Aspirin; Darvon [propoxyphene hcl]; Ibuprofen; Imitrex [sumatriptan]; Meperidine; Monosodium glutamate; Nitrates,  organic; Nsaids; and Sulfa antibiotics   Review of Systems Review of Systems  Psychiatric/Behavioral: Positive for dysphoric mood and suicidal ideas.  All other systems reviewed and are negative.    Physical Exam Updated Vital Signs There were no vitals taken for this visit.  Physical Exam  Constitutional: She is oriented to person, place, and time.  Depressed, tearful, crying   HENT:  Head: Normocephalic.  Mouth/Throat: Oropharynx is clear and moist.  Eyes: Conjunctivae and EOM are normal. Pupils are equal, round, and reactive to light.  Neck: Normal range of motion. Neck supple.  Cardiovascular: Normal rate, regular rhythm and normal heart sounds.  Pulmonary/Chest: Effort normal and breath sounds normal. No stridor. No respiratory distress. She has no wheezes.  Abdominal: Soft. Bowel sounds are normal. She exhibits no distension. There is no tenderness.  Musculoskeletal: Normal range of motion.  Neurological: She is alert and oriented to person, place, and time. No cranial nerve deficit. Coordination normal.  Skin: Skin is warm.  Psychiatric: She has a normal mood and affect.  Nursing note and vitals reviewed.    ED Treatments / Results  Labs (all labs ordered are listed, but only abnormal results are displayed) Labs Reviewed  CBC WITH DIFFERENTIAL/PLATELET  COMPREHENSIVE METABOLIC PANEL  ETHANOL  SALICYLATE LEVEL  ACETAMINOPHEN LEVEL  RAPID URINE DRUG SCREEN, HOSP PERFORMED    EKG  EKG Interpretation None       Radiology No results found.  Procedures Procedures (including critical care time)  Medications Ordered in ED Medications - No data to display   Initial Impression / Assessment and Plan / ED Course  I have reviewed the triage vital signs and the nursing notes.  Pertinent labs & imaging results that were available during my care of the patient were reviewed by me and considered in my medical decision making (see chart for details).      Marie Lyons is a 68 y.o. female here with depression, suicidal ideation. Likely adjustment disorder vs depression. She is on lexapro. Will get medical clearance labs, consult TTS.   6:56 PM Labs and UDS negative. Medically cleared. Psych recommend geripsych admission. Holding orders placed.    Final Clinical Impressions(s) / ED Diagnoses   Final diagnoses:  None    ED Discharge Orders    None       Drenda Freeze, MD 01/04/18 [phone]

## 2018-01-04 NOTE — BH Assessment (Signed)
[place] Assessment Progress Note Case was staffed with Romilda Garret FNP who recommended a inpatient Geropsychiatry admission.
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
## 2018-01-04 NOTE — Progress Notes (Signed)
PHARMACIST - PHYSICIAN ORDER COMMUNICATION  CONCERNING: P&T Medication Policy on Herbal Medications  DESCRIPTION:  This patient's order for:  Melatonin  has been noted.  This product(s) is classified as an "herbal" or natural product. Due to a lack of definitive safety studies or FDA approval, nonstandard manufacturing practices, plus the potential risk of unknown drug-drug interactions while on inpatient medications, the Pharmacy and Therapeutics Committee does not permit the use of "herbal" or natural products of this type within [facility].   ACTION TAKEN: The pharmacy department is unable to verify this order at this time and your patient has been informed of this safety policy. Please reevaluate patient's clinical condition at discharge and address if the herbal or natural product(s) should be resumed at that time.  Peggyann Juba, PharmD, BCPS 01/04/2018 7:00 PM

## 2018-01-04 NOTE — ED Triage Notes (Signed)
Pt is presented by [employer] who also serve her with IVC paperwork with petitioner's (son) section indicting pt is allegedly "a danger to herself, has lost her will to live, wants to go heaven, has a plan and has declined seeking help." Pt is tearful upon presentation, calm and cooperative during this initial assessment, endorses being severely depressed and declines answering if she is suicidal/having thoughts of harming herself.

## 2018-01-04 NOTE — ED Notes (Signed)
Bed: DU43 Expected date: 01/04/18 Expected time: 3:05 PM Means of arrival: Police Comments: Medical Clearance

## 2018-01-05 DIAGNOSIS — M549 Dorsalgia, unspecified: Secondary | ICD-10-CM | POA: Diagnosis present

## 2018-01-05 DIAGNOSIS — R450 Nervousness: Secondary | ICD-10-CM | POA: Diagnosis not present

## 2018-01-05 DIAGNOSIS — J449 Chronic obstructive pulmonary disease, unspecified: Secondary | ICD-10-CM | POA: Diagnosis not present

## 2018-01-05 DIAGNOSIS — F419 Anxiety disorder, unspecified: Secondary | ICD-10-CM | POA: Diagnosis not present

## 2018-01-05 DIAGNOSIS — F17210 Nicotine dependence, cigarettes, uncomplicated: Secondary | ICD-10-CM

## 2018-01-05 DIAGNOSIS — N23 Unspecified renal colic: Secondary | ICD-10-CM | POA: Diagnosis present

## 2018-01-05 DIAGNOSIS — I10 Essential (primary) hypertension: Secondary | ICD-10-CM | POA: Diagnosis not present

## 2018-01-05 DIAGNOSIS — Z91411 Personal history of adult psychological abuse: Secondary | ICD-10-CM | POA: Diagnosis not present

## 2018-01-05 DIAGNOSIS — R45851 Suicidal ideations: Secondary | ICD-10-CM | POA: Diagnosis not present

## 2018-01-05 DIAGNOSIS — Z91410 Personal history of adult physical and sexual abuse: Secondary | ICD-10-CM | POA: Diagnosis not present

## 2018-01-05 DIAGNOSIS — Z630 Problems in relationship with spouse or partner: Secondary | ICD-10-CM | POA: Diagnosis not present

## 2018-01-05 DIAGNOSIS — F4310 Post-traumatic stress disorder, unspecified: Secondary | ICD-10-CM | POA: Diagnosis present

## 2018-01-05 DIAGNOSIS — F332 Major depressive disorder, recurrent severe without psychotic features: Secondary | ICD-10-CM

## 2018-01-05 DIAGNOSIS — I499 Cardiac arrhythmia, unspecified: Secondary | ICD-10-CM | POA: Diagnosis present

## 2018-01-05 DIAGNOSIS — G43909 Migraine, unspecified, not intractable, without status migrainosus: Secondary | ICD-10-CM | POA: Diagnosis present

## 2018-01-05 NOTE — BHH Counselor (Signed)
Clinician referred the pt to the follow inpatient treatment facilities:   [facility].  [facility]. [facility]. [facility].  TTS to continue to seek placement.   Vertell Novak, MS, [facility], [facility] Triage Specialist [phone]

## 2018-01-05 NOTE — BH Assessment (Signed)
01/05/2018 Per Helene Kelp at [facility], pt is accepted by Dr. Darletta Moll to [facility] building, [address]. Call report to [phone]. They request IVC faxed to [phone].
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
## 2018-01-16 ENCOUNTER — Ambulatory Visit: Payer: Self-pay | Admitting: Psychology

## 2018-01-23 DIAGNOSIS — Z6820 Body mass index (BMI) 20.0-20.9, adult: Secondary | ICD-10-CM | POA: Diagnosis not present

## 2018-01-23 DIAGNOSIS — R300 Dysuria: Secondary | ICD-10-CM | POA: Diagnosis not present

## 2018-01-23 DIAGNOSIS — M545 Low back pain: Secondary | ICD-10-CM | POA: Diagnosis not present

## 2018-01-23 DIAGNOSIS — F324 Major depressive disorder, single episode, in partial remission: Secondary | ICD-10-CM | POA: Diagnosis not present

## 2018-01-30 ENCOUNTER — Ambulatory Visit: Payer: Self-pay | Admitting: Psychology

## 2018-02-04 ENCOUNTER — Ambulatory Visit (INDEPENDENT_AMBULATORY_CARE_PROVIDER_SITE_OTHER): Payer: Medicare Other | Admitting: Psychology

## 2018-02-04 DIAGNOSIS — F4323 Adjustment disorder with mixed anxiety and depressed mood: Secondary | ICD-10-CM | POA: Diagnosis not present

## 2018-02-13 ENCOUNTER — Ambulatory Visit: Payer: Self-pay | Admitting: Psychology

## 2018-02-27 ENCOUNTER — Ambulatory Visit: Payer: Self-pay | Admitting: Psychology

## 2018-02-27 ENCOUNTER — Emergency Department (HOSPITAL_COMMUNITY): Payer: Medicare Other

## 2018-02-27 ENCOUNTER — Encounter (HOSPITAL_COMMUNITY): Payer: Self-pay

## 2018-02-27 ENCOUNTER — Emergency Department (HOSPITAL_COMMUNITY)
Admission: EM | Admit: 2018-02-27 | Discharge: 2018-02-27 | Disposition: A | Discharge status: Home / Self Care | Payer: Medicare Other | Attending: Emergency Medicine | Admitting: Emergency Medicine

## 2018-02-27 DIAGNOSIS — Y999 Unspecified external cause status: Secondary | ICD-10-CM | POA: Insufficient documentation

## 2018-02-27 DIAGNOSIS — S61411A Laceration without foreign body of right hand, initial encounter: Secondary | ICD-10-CM | POA: Diagnosis not present

## 2018-02-27 DIAGNOSIS — Y939 Activity, unspecified: Secondary | ICD-10-CM | POA: Insufficient documentation

## 2018-02-27 DIAGNOSIS — Z23 Encounter for immunization: Secondary | ICD-10-CM | POA: Insufficient documentation

## 2018-02-27 DIAGNOSIS — J449 Chronic obstructive pulmonary disease, unspecified: Secondary | ICD-10-CM | POA: Diagnosis not present

## 2018-02-27 DIAGNOSIS — S61251A Open bite of left index finger without damage to nail, initial encounter: Secondary | ICD-10-CM | POA: Diagnosis not present

## 2018-02-27 DIAGNOSIS — F17210 Nicotine dependence, cigarettes, uncomplicated: Secondary | ICD-10-CM | POA: Insufficient documentation

## 2018-02-27 DIAGNOSIS — S61452A Open bite of left hand, initial encounter: Secondary | ICD-10-CM | POA: Diagnosis not present

## 2018-02-27 DIAGNOSIS — S61211A Laceration without foreign body of left index finger without damage to nail, initial encounter: Secondary | ICD-10-CM | POA: Insufficient documentation

## 2018-02-27 DIAGNOSIS — Y929 Unspecified place or not applicable: Secondary | ICD-10-CM | POA: Insufficient documentation

## 2018-02-27 DIAGNOSIS — Z79899 Other long term (current) drug therapy: Secondary | ICD-10-CM | POA: Insufficient documentation

## 2018-02-27 DIAGNOSIS — S61402A Unspecified open wound of left hand, initial encounter: Secondary | ICD-10-CM | POA: Diagnosis not present

## 2018-02-27 DIAGNOSIS — W540XXA Bitten by dog, initial encounter: Secondary | ICD-10-CM | POA: Insufficient documentation

## 2018-02-27 MED ORDER — BACITRACIN ZINC 500 UNIT/GM EX OINT
TOPICAL_OINTMENT | CUTANEOUS | Status: AC
  Administered 2018-02-27: 1
  Filled 2018-02-27: qty 0.9

## 2018-02-27 MED ORDER — HYDROCODONE-ACETAMINOPHEN 5-325 MG PO TABS
1.0000 | ORAL_TABLET | Freq: Once | ORAL | Status: AC
  Administered 2018-02-27: 1 via ORAL
  Filled 2018-02-27: qty 1

## 2018-02-27 MED ORDER — DOXYCYCLINE HYCLATE 100 MG PO TABS
100.0000 mg | ORAL_TABLET | Freq: Once | ORAL | Status: AC
  Administered 2018-02-27: 100 mg via ORAL
  Filled 2018-02-27: qty 1

## 2018-02-27 MED ORDER — TETANUS-DIPHTH-ACELL PERTUSSIS 5-2.5-18.5 LF-MCG/0.5 IM SUSP
0.5000 mL | Freq: Once | INTRAMUSCULAR | Status: AC
  Administered 2018-02-27: 0.5 mL via INTRAMUSCULAR
  Filled 2018-02-27: qty 0.5

## 2018-02-27 MED ORDER — DOXYCYCLINE HYCLATE 100 MG PO CAPS: 100.0000 mg | ORAL_CAPSULE | Freq: Two times a day (BID) | ORAL | 0 refills | Status: AC

## 2018-02-27 MED ORDER — CLINDAMYCIN HCL 300 MG PO CAPS
300.0000 mg | ORAL_CAPSULE | Freq: Once | ORAL | Status: AC
  Administered 2018-02-27: 300 mg via ORAL
  Filled 2018-02-27: qty 1

## 2018-02-27 MED ORDER — CLINDAMYCIN HCL 300 MG PO CAPS: 300.0000 mg | ORAL_CAPSULE | Freq: Three times a day (TID) | ORAL | 0 refills | Status: AC

## 2018-02-27 NOTE — ED Provider Notes (Signed)
[place] DEPT Provider Note   CSN: [id number] Arrival date & time: 02/27/18  0049     History   Chief Complaint Chief Complaint  Patient presents with  . Animal Bite    HPI Marie Lyons is a 68 y.o. female.  HPI   68 year old right-hand-dominant female here with dog bite to the left hand.  Patient was with her dog when she accidentally tried to remove a cookie that he was eating.  The patient's dog subsequently snapped and bit her on her hand.  The dog is fully vaccinated.  Last tetanus is unknown.  She sustained skin tears to the hand with mild bleeding at the time.  Of note, she has a history of surgery on her index finger in 2017.  She currently has a sharp, stabbing pain worse with movement and palpation.  Bleeding is controlled.  No other injuries.  Past Medical History:  Diagnosis Date  . Cardiac arrhythmia   . Chronic back pain   . High cholesterol   . Migraine   . Scoliosis   . Stroke [facility])     Patient Active Problem List   Diagnosis Date Noted  . Major depressive disorder, recurrent episode, severe ([place]) 01/05/2018  . COPD exacerbation ([place])   . Influenza with pneumonia   . Community acquired pneumonia of right middle lobe of lung ([place]) 02/12/2017  . Acute respiratory failure with hypoxia ([place]) 02/12/2017  . Tobacco user 02/12/2017  . Essential hypertension 02/12/2017  . HLD (hyperlipidemia) 02/12/2017    Past Surgical History:  Procedure Laterality Date  . ABDOMINAL HYSTERECTOMY    . BUNIONECTOMY    . ROTATOR CUFF REPAIR      OB History    No data available       Home Medications    Prior to Admission medications   Medication Sig Start Date End Date Taking? Authorizing Provider  acetaminophen (TYLENOL) 500 MG tablet Take 500 mg by mouth every 6 (six) hours as needed for headache (or pain).     [provider]  aspirin 81 MG tablet Take 81 mg by mouth daily.    [provider]  calcium  elemental as carbonate (TUMS ULTRA 1000) 400 MG tablet Chew 400-800 mg by mouth daily as needed for heartburn.     [provider]  cholecalciferol (VITAMIN D) 1000 UNITS tablet Take 1,000 Units by mouth daily.     [provider]  clindamycin (CLEOCIN) 300 MG capsule Take 1 capsule (300 mg total) by mouth 3 (three) times daily for 10 days. 02/27/18 03/09/18  Duffy Bruce, MD  cyclobenzaprine (FLEXERIL) 10 MG tablet Take 5-10 mg by mouth at bedtime as needed for muscle spasms. 11/14/17   [provider]  doxycycline (VIBRAMYCIN) 100 MG capsule Take 1 capsule (100 mg total) by mouth 2 (two) times daily for 10 days. 02/27/18 03/09/18  Duffy Bruce, MD  escitalopram (LEXAPRO) 20 MG tablet Take 20 mg by mouth daily. 11/14/17   [provider]  estradiol (VIVELLE-DOT) 0.1 MG/24HR patch Place 1 patch onto the skin every Wednesday.    [provider]  fluticasone (FLONASE) 50 MCG/ACT nasal spray Place 2 sprays into the nose daily. Patient not taking: Reported on 02/12/2017 07/25/13   Roselee Culver, MD  HYDROcodone-acetaminophen (NORCO/VICODIN) 5-325 MG per tablet Take 1 tablet by mouth every 6 (six) hours as needed for moderate pain. 09/08/14   Lawyer, Harrell Gave, PA-C  MELATONIN PO Take 1 tablet by mouth at bedtime.  [provider]  methocarbamol (ROBAXIN) 500 MG tablet Take 1 tablet (500 mg total) by mouth 4 (four) times daily. Patient not taking: Reported on 01/04/2018 02/15/17   Reyne Dumas, MD  metoprolol succinate (TOPROL-XL) 25 MG 24 hr tablet Take 25 mg by mouth at bedtime.    [provider]  simvastatin (ZOCOR) 20 MG tablet Take 20 mg by mouth at bedtime.    [provider]  VENTOLIN HFA 108 (90 Base) MCG/ACT inhaler Inhale 2 puffs into the lungs every 4 (four) hours as needed for wheezing or shortness of breath. 10/17/17   [provider]  vitamin C (ASCORBIC ACID) 250 MG tablet Take 250 mg by mouth daily.     [provider]    Family History Family History  Problem Relation Age of Onset  . CAD Father   . Stroke Neg Hx     Social History Social History   Tobacco Use  . Smoking status: Current Every Day Smoker    Packs/day: 0.50    Years: 25.00    Pack years: 12.50    Types: Cigarettes  . Smokeless tobacco: Never Used  Substance Use Topics  . Alcohol use: No  . Drug use: No     Allergies   Ciprofloxacin; Penicillins; Aspirin; Darvon [propoxyphene hcl]; Ibuprofen; Imitrex [sumatriptan]; Meperidine; Monosodium glutamate; Nitrates, organic; Nsaids; and Sulfa antibiotics   Review of Systems Review of Systems  Constitutional: Negative for chills and fever.  Respiratory: Negative for shortness of breath.   Cardiovascular: Negative for chest pain.  Musculoskeletal: Negative for neck pain.  Skin: Positive for wound. Negative for rash.  Allergic/Immunologic: Negative for immunocompromised state.  Neurological: Negative for weakness and numbness.  Hematological: Does not bruise/bleed easily.     Physical Exam Updated Vital Signs BP (!) 131/51 (BP Location: Left Arm)   Pulse 62   Temp (!) 97.4 F (36.3 C) (Oral)   Resp 20   SpO2 98%   Physical Exam  Constitutional: She is oriented to person, place, and time. She appears well-developed and well-nourished. No distress.  HENT:  Head: Normocephalic and atraumatic.  Eyes: Conjunctivae are normal.  Neck: Neck supple.  Cardiovascular: Normal rate, regular rhythm and normal heart sounds.  Pulmonary/Chest: Effort normal. No respiratory distress. She has no wheezes.  Abdominal: She exhibits no distension.  Musculoskeletal: She exhibits no edema.  Neurological: She is alert and oriented to person, place, and time. She exhibits normal muscle tone.  Skin: Skin is warm. Capillary refill takes less than 2 seconds. No rash noted.  Nursing note and vitals reviewed.   UPPER EXTREMITY EXAM: LEFT  INSPECTION &  PALPATION: Superficial, 2 cm skin tear to the dorsum of the hand overlying the thumb and index metacarpals.  No deep tissue involvement.  Superficial, irregular skin tears to the dorsal aspect of the index finger.  No deep puncture wounds.  No apparent joint extension or involvement.  SENSORY: Sensation is intact to light touch in:  Superficial radial nerve distribution (dorsal first web space) Median nerve distribution (tip of index finger)   Ulnar nerve distribution (tip of small finger)     MOTOR:  + Motor posterior interosseous nerve (thumb IP extension) + Anterior interosseous nerve (thumb IP flexion, index finger DIP flexion) + Radial nerve (wrist extension) + Median nerve (palpable firing thenar mass) + Ulnar nerve (palpable firing of first dorsal interosseous muscle)  VASCULAR: 2+ radial pulse Brisk capillary refill < 2 sec, fingers warm and well-perfused  TENDONS: Tested  extension of DIP/PIP joints of index fingers and all intact.  COMPARTMENTS: Soft, warm, well-perfused No pain with passive extension No paresthesias   ED Treatments / Results  Labs (all labs ordered are listed, but only abnormal results are displayed) Labs Reviewed - No data to display  EKG  EKG Interpretation None       Radiology Dg Hand Complete Left  Result Date: 02/27/2018 CLINICAL DATA:  68 year old female with dog bite. Evaluate for foreign object. EXAM: LEFT HAND - COMPLETE 3+ VIEW COMPARISON:  None. FINDINGS: There is no acute fracture or dislocation. Advanced osteopenia. A fixation screw noted DIP of the fifth digit. Disc spacers noted at the second and third MCP joints. There is widening of the scapholunate distance. Multiple subchondral cysts in the carpal bones. There is mild soft tissue thickening over the second and third MCP joints. Clinical correlation is recommended to exclude an infectious process. IMPRESSION: No acute fracture or dislocation. Second and third MCP joint spacers  with overlying soft tissue swelling. Clinical correlation is recommended to exclude an infectious process. Electronically Signed   By: Anner Crete M.D.   On: 02/27/2018 02:15    Procedures Procedures (including critical care time)  Medications Ordered in ED Medications  Tdap (BOOSTRIX) injection 0.5 mL (0.5 mLs Intramuscular Given 02/27/18 0151)  bacitracin 500 UNIT/GM ointment (1 application  Given 1/66/06 0153)  HYDROcodone-acetaminophen (NORCO/VICODIN) 5-325 MG per tablet 1 tablet (1 tablet Oral Given 02/27/18 0221)  doxycycline (VIBRA-TABS) tablet 100 mg (100 mg Oral Given 02/27/18 0235)  clindamycin (CLEOCIN) capsule 300 mg (300 mg Oral Given 02/27/18 0235)     Initial Impression / Assessment and Plan / ED Course  I have reviewed the triage vital signs and the nursing notes.  Pertinent labs & imaging results that were available during my care of the patient were reviewed by me and considered in my medical decision making (see chart for details).     68 year old right-hand-dominant female here with left hand wounds from dog bite.  Dog was vaccinated.  Plain films showed no retained foreign bodies.  Tetanus was updated.  The wounds were cleansed thoroughly then dressed with sterile dressings.  Will place the patient on empiric antibiotics.  Of note, the patient has had surgery on her index finger.  There is no apparent extension to the surgical sites or the joint spaces, but will have her follow-up with her hand surgeon for repeat wound check.  Strict return precautions were given in detail.  Of note, doxy/Clinda required due to multiple allergies, per UptoDate/IDSA guidelines.  Final Clinical Impressions(s) / ED Diagnoses   Final diagnoses:  Dog bite, initial encounter    ED Discharge Orders        Ordered    doxycycline (VIBRAMYCIN) 100 MG capsule  2 times daily     02/27/18 0255    clindamycin (CLEOCIN) 300 MG capsule  3 times daily     02/27/18 0255       Duffy Bruce, MD 02/27/18 [phone]

## 2018-02-27 NOTE — Discharge Instructions (Signed)
Wash the wounds twice daily with clean, soapy water  Apply sterile dressings. You can use Tegaderms to help keep the wounds clean. Try to use a non-stick dressing.  Take the antibiotics as prescribed.

## 2018-02-27 NOTE — ED Triage Notes (Addendum)
Pt tried to take a treat away from her dog and he bit her on the left index finger and the side of her hand

## 2018-03-04 DIAGNOSIS — B351 Tinea unguium: Secondary | ICD-10-CM | POA: Diagnosis not present

## 2018-03-04 DIAGNOSIS — F17210 Nicotine dependence, cigarettes, uncomplicated: Secondary | ICD-10-CM | POA: Diagnosis not present

## 2018-03-04 DIAGNOSIS — L089 Local infection of the skin and subcutaneous tissue, unspecified: Secondary | ICD-10-CM | POA: Diagnosis not present

## 2018-03-04 DIAGNOSIS — S61251A Open bite of left index finger without damage to nail, initial encounter: Secondary | ICD-10-CM | POA: Diagnosis not present

## 2018-03-04 DIAGNOSIS — W540XXA Bitten by dog, initial encounter: Secondary | ICD-10-CM | POA: Diagnosis not present

## 2018-03-04 DIAGNOSIS — J410 Simple chronic bronchitis: Secondary | ICD-10-CM | POA: Diagnosis not present

## 2018-03-13 ENCOUNTER — Ambulatory Visit: Payer: Self-pay | Admitting: Psychology

## 2018-03-27 ENCOUNTER — Ambulatory Visit: Payer: Self-pay | Admitting: Psychology

## 2018-04-02 DIAGNOSIS — S8002XA Contusion of left knee, initial encounter: Secondary | ICD-10-CM | POA: Diagnosis not present

## 2018-04-02 DIAGNOSIS — S42302A Unspecified fracture of shaft of humerus, left arm, initial encounter for closed fracture: Secondary | ICD-10-CM | POA: Diagnosis not present

## 2018-04-02 DIAGNOSIS — S60222A Contusion of left hand, initial encounter: Secondary | ICD-10-CM | POA: Diagnosis not present

## 2018-04-08 DIAGNOSIS — S8002XA Contusion of left knee, initial encounter: Secondary | ICD-10-CM | POA: Diagnosis not present

## 2018-04-08 DIAGNOSIS — S2232XA Fracture of one rib, left side, initial encounter for closed fracture: Secondary | ICD-10-CM | POA: Diagnosis not present

## 2018-04-10 ENCOUNTER — Ambulatory Visit: Payer: Self-pay | Admitting: Psychology

## 2018-04-24 ENCOUNTER — Ambulatory Visit: Payer: Self-pay | Admitting: Psychology

## 2018-04-24 DIAGNOSIS — R32 Unspecified urinary incontinence: Secondary | ICD-10-CM | POA: Diagnosis not present

## 2018-04-24 DIAGNOSIS — E7800 Pure hypercholesterolemia, unspecified: Secondary | ICD-10-CM | POA: Diagnosis not present

## 2018-04-24 DIAGNOSIS — M62838 Other muscle spasm: Secondary | ICD-10-CM | POA: Diagnosis not present

## 2018-04-24 DIAGNOSIS — I10 Essential (primary) hypertension: Secondary | ICD-10-CM | POA: Diagnosis not present

## 2018-04-24 DIAGNOSIS — E46 Unspecified protein-calorie malnutrition: Secondary | ICD-10-CM | POA: Diagnosis not present

## 2018-04-24 DIAGNOSIS — F17200 Nicotine dependence, unspecified, uncomplicated: Secondary | ICD-10-CM | POA: Diagnosis not present

## 2018-04-24 DIAGNOSIS — M8580 Other specified disorders of bone density and structure, unspecified site: Secondary | ICD-10-CM | POA: Diagnosis not present

## 2018-04-24 DIAGNOSIS — G44209 Tension-type headache, unspecified, not intractable: Secondary | ICD-10-CM | POA: Diagnosis not present

## 2018-04-24 DIAGNOSIS — J410 Simple chronic bronchitis: Secondary | ICD-10-CM | POA: Diagnosis not present

## 2018-04-24 DIAGNOSIS — M545 Low back pain: Secondary | ICD-10-CM | POA: Diagnosis not present

## 2018-04-24 DIAGNOSIS — F324 Major depressive disorder, single episode, in partial remission: Secondary | ICD-10-CM | POA: Diagnosis not present

## 2018-04-24 DIAGNOSIS — D692 Other nonthrombocytopenic purpura: Secondary | ICD-10-CM | POA: Diagnosis not present

## 2018-05-08 ENCOUNTER — Ambulatory Visit: Payer: Self-pay | Admitting: Psychology

## 2018-05-22 ENCOUNTER — Ambulatory Visit: Payer: Self-pay | Admitting: Psychology

## 2018-08-31 DIAGNOSIS — N390 Urinary tract infection, site not specified: Secondary | ICD-10-CM | POA: Diagnosis not present

## 2018-10-23 DIAGNOSIS — Z1159 Encounter for screening for other viral diseases: Secondary | ICD-10-CM | POA: Diagnosis not present

## 2018-10-23 DIAGNOSIS — Z23 Encounter for immunization: Secondary | ICD-10-CM | POA: Diagnosis not present

## 2018-10-23 DIAGNOSIS — M8580 Other specified disorders of bone density and structure, unspecified site: Secondary | ICD-10-CM | POA: Diagnosis not present

## 2018-10-23 DIAGNOSIS — F17200 Nicotine dependence, unspecified, uncomplicated: Secondary | ICD-10-CM | POA: Diagnosis not present

## 2018-10-23 DIAGNOSIS — J410 Simple chronic bronchitis: Secondary | ICD-10-CM | POA: Diagnosis not present

## 2018-10-23 DIAGNOSIS — M62838 Other muscle spasm: Secondary | ICD-10-CM | POA: Diagnosis not present

## 2018-10-23 DIAGNOSIS — Z6820 Body mass index (BMI) 20.0-20.9, adult: Secondary | ICD-10-CM | POA: Diagnosis not present

## 2018-10-23 DIAGNOSIS — E46 Unspecified protein-calorie malnutrition: Secondary | ICD-10-CM | POA: Diagnosis not present

## 2018-10-23 DIAGNOSIS — D692 Other nonthrombocytopenic purpura: Secondary | ICD-10-CM | POA: Diagnosis not present

## 2018-10-23 DIAGNOSIS — Z0000 Encounter for general adult medical examination without abnormal findings: Secondary | ICD-10-CM | POA: Diagnosis not present

## 2018-10-23 DIAGNOSIS — N390 Urinary tract infection, site not specified: Secondary | ICD-10-CM | POA: Diagnosis not present

## 2018-10-23 DIAGNOSIS — I10 Essential (primary) hypertension: Secondary | ICD-10-CM | POA: Diagnosis not present

## 2018-10-23 DIAGNOSIS — E7800 Pure hypercholesterolemia, unspecified: Secondary | ICD-10-CM | POA: Diagnosis not present

## 2018-10-23 DIAGNOSIS — R32 Unspecified urinary incontinence: Secondary | ICD-10-CM | POA: Diagnosis not present

## 2018-11-04 DIAGNOSIS — Z803 Family history of malignant neoplasm of breast: Secondary | ICD-10-CM | POA: Diagnosis not present

## 2018-11-04 DIAGNOSIS — Z87891 Personal history of nicotine dependence: Secondary | ICD-10-CM | POA: Diagnosis not present

## 2018-11-04 DIAGNOSIS — M8589 Other specified disorders of bone density and structure, multiple sites: Secondary | ICD-10-CM | POA: Diagnosis not present

## 2018-11-04 DIAGNOSIS — Z90710 Acquired absence of both cervix and uterus: Secondary | ICD-10-CM | POA: Diagnosis not present

## 2018-11-04 DIAGNOSIS — Z780 Asymptomatic menopausal state: Secondary | ICD-10-CM | POA: Diagnosis not present

## 2018-11-04 DIAGNOSIS — Z1231 Encounter for screening mammogram for malignant neoplasm of breast: Secondary | ICD-10-CM | POA: Diagnosis not present

## 2018-11-04 DIAGNOSIS — Z8262 Family history of osteoporosis: Secondary | ICD-10-CM | POA: Diagnosis not present

## 2018-11-14 DIAGNOSIS — Z1211 Encounter for screening for malignant neoplasm of colon: Secondary | ICD-10-CM | POA: Diagnosis not present

## 2019-01-14 DIAGNOSIS — M545 Low back pain: Secondary | ICD-10-CM | POA: Diagnosis not present

## 2019-04-23 DIAGNOSIS — F324 Major depressive disorder, single episode, in partial remission: Secondary | ICD-10-CM | POA: Diagnosis not present

## 2019-04-23 DIAGNOSIS — I10 Essential (primary) hypertension: Secondary | ICD-10-CM | POA: Diagnosis not present

## 2019-04-23 DIAGNOSIS — M545 Low back pain: Secondary | ICD-10-CM | POA: Diagnosis not present

## 2019-04-23 DIAGNOSIS — D692 Other nonthrombocytopenic purpura: Secondary | ICD-10-CM | POA: Diagnosis not present

## 2019-04-23 DIAGNOSIS — E7800 Pure hypercholesterolemia, unspecified: Secondary | ICD-10-CM | POA: Diagnosis not present

## 2019-04-23 DIAGNOSIS — E46 Unspecified protein-calorie malnutrition: Secondary | ICD-10-CM | POA: Diagnosis not present

## 2019-04-23 DIAGNOSIS — R32 Unspecified urinary incontinence: Secondary | ICD-10-CM | POA: Diagnosis not present

## 2019-04-23 DIAGNOSIS — M8580 Other specified disorders of bone density and structure, unspecified site: Secondary | ICD-10-CM | POA: Diagnosis not present

## 2019-04-23 DIAGNOSIS — M62838 Other muscle spasm: Secondary | ICD-10-CM | POA: Diagnosis not present

## 2019-04-23 DIAGNOSIS — G44209 Tension-type headache, unspecified, not intractable: Secondary | ICD-10-CM | POA: Diagnosis not present

## 2019-04-23 DIAGNOSIS — J410 Simple chronic bronchitis: Secondary | ICD-10-CM | POA: Diagnosis not present

## 2019-04-23 DIAGNOSIS — F17200 Nicotine dependence, unspecified, uncomplicated: Secondary | ICD-10-CM | POA: Diagnosis not present

## 2019-07-14 DIAGNOSIS — R350 Frequency of micturition: Secondary | ICD-10-CM | POA: Diagnosis not present

## 2019-10-29 DIAGNOSIS — E7800 Pure hypercholesterolemia, unspecified: Secondary | ICD-10-CM | POA: Diagnosis not present

## 2019-10-29 DIAGNOSIS — M545 Low back pain: Secondary | ICD-10-CM | POA: Diagnosis not present

## 2019-10-29 DIAGNOSIS — E46 Unspecified protein-calorie malnutrition: Secondary | ICD-10-CM | POA: Diagnosis not present

## 2019-10-29 DIAGNOSIS — R32 Unspecified urinary incontinence: Secondary | ICD-10-CM | POA: Diagnosis not present

## 2019-10-29 DIAGNOSIS — I10 Essential (primary) hypertension: Secondary | ICD-10-CM | POA: Diagnosis not present

## 2019-10-29 DIAGNOSIS — Z23 Encounter for immunization: Secondary | ICD-10-CM | POA: Diagnosis not present

## 2019-10-29 DIAGNOSIS — F17200 Nicotine dependence, unspecified, uncomplicated: Secondary | ICD-10-CM | POA: Diagnosis not present

## 2019-10-29 DIAGNOSIS — M62838 Other muscle spasm: Secondary | ICD-10-CM | POA: Diagnosis not present

## 2019-10-29 DIAGNOSIS — F324 Major depressive disorder, single episode, in partial remission: Secondary | ICD-10-CM | POA: Diagnosis not present

## 2019-10-29 DIAGNOSIS — Z136 Encounter for screening for cardiovascular disorders: Secondary | ICD-10-CM | POA: Diagnosis not present

## 2019-10-29 DIAGNOSIS — Z0000 Encounter for general adult medical examination without abnormal findings: Secondary | ICD-10-CM | POA: Diagnosis not present

## 2019-11-09 DIAGNOSIS — Z1211 Encounter for screening for malignant neoplasm of colon: Secondary | ICD-10-CM | POA: Diagnosis not present

## 2020-02-12 DIAGNOSIS — R350 Frequency of micturition: Secondary | ICD-10-CM | POA: Diagnosis not present

## 2020-03-03 ENCOUNTER — Ambulatory Visit: Payer: Medicare HMO | Attending: Internal Medicine

## 2020-03-03 DIAGNOSIS — Z23 Encounter for immunization: Secondary | ICD-10-CM

## 2020-03-03 NOTE — Progress Notes (Signed)
   Covid-19 Vaccination Clinic  Name:  Marie Lyons    MRN: [id number] DOB: 07-03-1950  03/03/2020  [name] was observed post Covid-19 immunization for 30 minutes based on pre-vaccination screening without incident. She was provided with Vaccine Information Sheet and instruction to access the V-Safe system.   [name] was instructed to call 911 with any severe reactions post vaccine: Marland Kitchen Difficulty breathing  . Swelling of face and throat  . A fast heartbeat  . A bad rash all over body  . Dizziness and weakness   Immunizations Administered    Name Date Dose VIS Date Route   Pfizer COVID-19 Vaccine 03/03/2020  6:50 PM 0.3 mL 12/11/2019 Intramuscular   Manufacturer: [place]   Lot: [id number]   [place]: [id number]

## 2020-03-30 ENCOUNTER — Ambulatory Visit: Payer: Medicare HMO | Attending: Internal Medicine

## 2020-03-30 DIAGNOSIS — Z23 Encounter for immunization: Secondary | ICD-10-CM

## 2020-04-22 DIAGNOSIS — R110 Nausea: Secondary | ICD-10-CM | POA: Diagnosis not present

## 2020-04-22 DIAGNOSIS — R300 Dysuria: Secondary | ICD-10-CM | POA: Diagnosis not present

## 2020-04-28 DIAGNOSIS — J410 Simple chronic bronchitis: Secondary | ICD-10-CM | POA: Diagnosis not present

## 2020-04-28 DIAGNOSIS — R32 Unspecified urinary incontinence: Secondary | ICD-10-CM | POA: Diagnosis not present

## 2020-04-28 DIAGNOSIS — E7800 Pure hypercholesterolemia, unspecified: Secondary | ICD-10-CM | POA: Diagnosis not present

## 2020-04-28 DIAGNOSIS — M62838 Other muscle spasm: Secondary | ICD-10-CM | POA: Diagnosis not present

## 2020-04-28 DIAGNOSIS — M545 Low back pain: Secondary | ICD-10-CM | POA: Diagnosis not present

## 2020-04-28 DIAGNOSIS — I10 Essential (primary) hypertension: Secondary | ICD-10-CM | POA: Diagnosis not present

## 2020-04-28 DIAGNOSIS — F17200 Nicotine dependence, unspecified, uncomplicated: Secondary | ICD-10-CM | POA: Diagnosis not present

## 2020-04-28 DIAGNOSIS — D692 Other nonthrombocytopenic purpura: Secondary | ICD-10-CM | POA: Diagnosis not present

## 2020-04-28 DIAGNOSIS — N3020 Other chronic cystitis without hematuria: Secondary | ICD-10-CM | POA: Diagnosis not present

## 2020-04-28 DIAGNOSIS — F324 Major depressive disorder, single episode, in partial remission: Secondary | ICD-10-CM | POA: Diagnosis not present

## 2020-04-28 DIAGNOSIS — M8580 Other specified disorders of bone density and structure, unspecified site: Secondary | ICD-10-CM | POA: Diagnosis not present

## 2020-04-28 DIAGNOSIS — E46 Unspecified protein-calorie malnutrition: Secondary | ICD-10-CM | POA: Diagnosis not present

## 2020-05-26 DIAGNOSIS — N3020 Other chronic cystitis without hematuria: Secondary | ICD-10-CM | POA: Diagnosis not present

## 2020-07-08 DIAGNOSIS — H43813 Vitreous degeneration, bilateral: Secondary | ICD-10-CM | POA: Diagnosis not present

## 2020-07-08 DIAGNOSIS — H02839 Dermatochalasis of unspecified eye, unspecified eyelid: Secondary | ICD-10-CM | POA: Diagnosis not present

## 2020-07-08 DIAGNOSIS — H2513 Age-related nuclear cataract, bilateral: Secondary | ICD-10-CM | POA: Diagnosis not present

## 2020-07-14 DIAGNOSIS — W1809XA Striking against other object with subsequent fall, initial encounter: Secondary | ICD-10-CM | POA: Diagnosis not present

## 2020-07-14 DIAGNOSIS — S92511A Displaced fracture of proximal phalanx of right lesser toe(s), initial encounter for closed fracture: Secondary | ICD-10-CM | POA: Diagnosis not present

## 2020-07-14 DIAGNOSIS — S63501A Unspecified sprain of right wrist, initial encounter: Secondary | ICD-10-CM | POA: Diagnosis not present

## 2020-09-07 DIAGNOSIS — H5213 Myopia, bilateral: Secondary | ICD-10-CM | POA: Diagnosis not present

## 2020-09-12 DIAGNOSIS — H2513 Age-related nuclear cataract, bilateral: Secondary | ICD-10-CM | POA: Diagnosis not present

## 2020-09-12 DIAGNOSIS — H2512 Age-related nuclear cataract, left eye: Secondary | ICD-10-CM | POA: Diagnosis not present

## 2020-10-20 DIAGNOSIS — H52202 Unspecified astigmatism, left eye: Secondary | ICD-10-CM | POA: Diagnosis not present

## 2020-10-20 DIAGNOSIS — H2512 Age-related nuclear cataract, left eye: Secondary | ICD-10-CM | POA: Diagnosis not present

## 2020-10-20 DIAGNOSIS — H2511 Age-related nuclear cataract, right eye: Secondary | ICD-10-CM | POA: Diagnosis not present

## 2020-11-03 DIAGNOSIS — H2511 Age-related nuclear cataract, right eye: Secondary | ICD-10-CM | POA: Diagnosis not present

## 2020-11-03 DIAGNOSIS — H52201 Unspecified astigmatism, right eye: Secondary | ICD-10-CM | POA: Diagnosis not present

## 2020-11-10 ENCOUNTER — Emergency Department (HOSPITAL_COMMUNITY): Payer: Medicare HMO

## 2020-11-10 ENCOUNTER — Other Ambulatory Visit: Payer: Self-pay

## 2020-11-10 ENCOUNTER — Encounter (HOSPITAL_COMMUNITY): Payer: Self-pay

## 2020-11-10 ENCOUNTER — Emergency Department (HOSPITAL_COMMUNITY)
Admission: EM | Admit: 2020-11-10 | Discharge: 2020-11-11 | Disposition: A | Discharge status: Home / Self Care | Payer: Medicare HMO | Attending: Emergency Medicine | Admitting: Emergency Medicine

## 2020-11-10 DIAGNOSIS — R519 Headache, unspecified: Secondary | ICD-10-CM | POA: Diagnosis not present

## 2020-11-10 DIAGNOSIS — S0990XA Unspecified injury of head, initial encounter: Secondary | ICD-10-CM | POA: Diagnosis not present

## 2020-11-10 DIAGNOSIS — R9082 White matter disease, unspecified: Secondary | ICD-10-CM | POA: Diagnosis not present

## 2020-11-10 DIAGNOSIS — R509 Fever, unspecified: Secondary | ICD-10-CM | POA: Diagnosis not present

## 2020-11-10 DIAGNOSIS — R1110 Vomiting, unspecified: Secondary | ICD-10-CM | POA: Insufficient documentation

## 2020-11-10 DIAGNOSIS — Z5321 Procedure and treatment not carried out due to patient leaving prior to being seen by health care provider: Secondary | ICD-10-CM | POA: Diagnosis not present

## 2020-11-10 DIAGNOSIS — W228XXA Striking against or struck by other objects, initial encounter: Secondary | ICD-10-CM | POA: Insufficient documentation

## 2020-11-10 LAB — COMPREHENSIVE METABOLIC PANEL
ALT: 17 U/L (ref 0–44)
AST: 30 U/L (ref 15–41)
Albumin: 3.9 g/dL (ref 3.5–5.0)
Alkaline Phosphatase: 76 U/L (ref 38–126)
Anion gap: 12 (ref 5–15)
BUN: 11 mg/dL (ref 8–23)
CO2: 26 mmol/L (ref 22–32)
Calcium: 8.9 mg/dL (ref 8.9–10.3)
Chloride: 93 mmol/L — ABNORMAL LOW (ref 98–111)
Creatinine, Ser: 0.89 mg/dL (ref 0.44–1.00)
GFR, Estimated: >60 mL/min (ref >60)
Glucose, Bld: 114 mg/dL — ABNORMAL HIGH (ref 70–99)
Potassium: 4.9 mmol/L (ref 3.5–5.1)
Sodium: 131 mmol/L — ABNORMAL LOW (ref 135–145)
Total Bilirubin: 0.6 mg/dL (ref 0.3–1.2)
Total Protein: 7.8 g/dL (ref 6.5–8.1)

## 2020-11-10 LAB — CBC
HCT: 39 % (ref 36.0–46.0)
Hemoglobin: 12.5 g/dL (ref 12.0–15.0)
MCH: 32 pg (ref 26.0–34.0)
MCHC: 32.1 g/dL (ref 30.0–36.0)
MCV: 99.7 fL (ref 80.0–100.0)
Platelets: 248 10*3/uL (ref 150–400)
RBC: 3.91 MIL/uL (ref 3.87–5.11)
RDW: 12.6 % (ref 11.5–15.5)
WBC: 17.9 10*3/uL — ABNORMAL HIGH (ref 4.0–10.5)
nRBC: 0 % (ref 0.0–0.2)

## 2020-11-10 LAB — LIPASE, BLOOD: Lipase: 25 U/L (ref 11–51)

## 2020-11-10 MED ORDER — ACETAMINOPHEN 325 MG PO TABS
650.0000 mg | ORAL_TABLET | Freq: Once | ORAL | Status: AC | PRN
  Administered 2020-11-10: 650 mg via ORAL
  Filled 2020-11-10: qty 2

## 2020-11-10 NOTE — ED Triage Notes (Signed)
Pt was diagnosed with concussion and pneumonia on Monday. Pt has developed a fever and vomiting since then. Pt reports hitting her head. Denies blood thinners.

## 2020-11-10 NOTE — ED Notes (Addendum)
LACTIC ON ICE, BLUE TOP AND DARK GREEN COLLECTED ALSO

## 2020-11-11 DIAGNOSIS — R110 Nausea: Secondary | ICD-10-CM | POA: Diagnosis not present

## 2020-11-11 DIAGNOSIS — R509 Fever, unspecified: Secondary | ICD-10-CM | POA: Diagnosis not present

## 2020-11-11 NOTE — ED Notes (Signed)
Pt eloped from waiting area. Called 3X.  

## 2020-11-18 ENCOUNTER — Emergency Department (HOSPITAL_COMMUNITY)
Admission: EM | Admit: 2020-11-18 | Discharge: 2020-11-19 | Disposition: A | Discharge status: Home / Self Care | Payer: Medicare HMO | Attending: Emergency Medicine | Admitting: Emergency Medicine

## 2020-11-18 ENCOUNTER — Emergency Department (HOSPITAL_COMMUNITY): Payer: Medicare HMO

## 2020-11-18 ENCOUNTER — Other Ambulatory Visit: Payer: Self-pay

## 2020-11-18 ENCOUNTER — Encounter (HOSPITAL_COMMUNITY): Payer: Self-pay | Admitting: Emergency Medicine

## 2020-11-18 DIAGNOSIS — M545 Low back pain, unspecified: Secondary | ICD-10-CM | POA: Diagnosis not present

## 2020-11-18 DIAGNOSIS — Z20822 Contact with and (suspected) exposure to covid-19: Secondary | ICD-10-CM | POA: Insufficient documentation

## 2020-11-18 DIAGNOSIS — F17210 Nicotine dependence, cigarettes, uncomplicated: Secondary | ICD-10-CM | POA: Diagnosis not present

## 2020-11-18 DIAGNOSIS — I10 Essential (primary) hypertension: Secondary | ICD-10-CM | POA: Insufficient documentation

## 2020-11-18 DIAGNOSIS — J189 Pneumonia, unspecified organism: Secondary | ICD-10-CM | POA: Diagnosis not present

## 2020-11-18 DIAGNOSIS — R1110 Vomiting, unspecified: Secondary | ICD-10-CM | POA: Diagnosis not present

## 2020-11-18 DIAGNOSIS — R112 Nausea with vomiting, unspecified: Secondary | ICD-10-CM | POA: Insufficient documentation

## 2020-11-18 DIAGNOSIS — R109 Unspecified abdominal pain: Secondary | ICD-10-CM | POA: Insufficient documentation

## 2020-11-18 DIAGNOSIS — R630 Anorexia: Secondary | ICD-10-CM | POA: Diagnosis present

## 2020-11-18 DIAGNOSIS — Z8673 Personal history of transient ischemic attack (TIA), and cerebral infarction without residual deficits: Secondary | ICD-10-CM | POA: Diagnosis not present

## 2020-11-18 DIAGNOSIS — R5381 Other malaise: Secondary | ICD-10-CM | POA: Diagnosis not present

## 2020-11-18 DIAGNOSIS — R531 Weakness: Secondary | ICD-10-CM | POA: Diagnosis not present

## 2020-11-18 DIAGNOSIS — R519 Headache, unspecified: Secondary | ICD-10-CM | POA: Diagnosis not present

## 2020-11-18 DIAGNOSIS — Z7982 Long term (current) use of aspirin: Secondary | ICD-10-CM | POA: Insufficient documentation

## 2020-11-18 DIAGNOSIS — N12 Tubulo-interstitial nephritis, not specified as acute or chronic: Secondary | ICD-10-CM | POA: Diagnosis not present

## 2020-11-18 DIAGNOSIS — J90 Pleural effusion, not elsewhere classified: Secondary | ICD-10-CM | POA: Diagnosis not present

## 2020-11-18 DIAGNOSIS — K5641 Fecal impaction: Secondary | ICD-10-CM | POA: Diagnosis not present

## 2020-11-18 DIAGNOSIS — J441 Chronic obstructive pulmonary disease with (acute) exacerbation: Secondary | ICD-10-CM | POA: Insufficient documentation

## 2020-11-18 DIAGNOSIS — J811 Chronic pulmonary edema: Secondary | ICD-10-CM | POA: Diagnosis not present

## 2020-11-18 LAB — BASIC METABOLIC PANEL
Anion gap: 10 (ref 5–15)
BUN: 12 mg/dL (ref 8–23)
CO2: 27 mmol/L (ref 22–32)
Calcium: 8.9 mg/dL (ref 8.9–10.3)
Chloride: 97 mmol/L — ABNORMAL LOW (ref 98–111)
Creatinine, Ser: 0.49 mg/dL (ref 0.44–1.00)
GFR, Estimated: >60 mL/min (ref >60)
Glucose, Bld: 96 mg/dL (ref 70–99)
Potassium: 4.2 mmol/L (ref 3.5–5.1)
Sodium: 134 mmol/L — ABNORMAL LOW (ref 135–145)

## 2020-11-18 LAB — HEPATIC FUNCTION PANEL
ALT: 40 U/L (ref 0–44)
AST: 30 U/L (ref 15–41)
Albumin: 3.2 g/dL — ABNORMAL LOW (ref 3.5–5.0)
Alkaline Phosphatase: 67 U/L (ref 38–126)
Bilirubin, Direct: 0.1 mg/dL (ref 0.0–0.2)
Indirect Bilirubin: 0.1 mg/dL — ABNORMAL LOW (ref 0.3–0.9)
Total Bilirubin: 0.2 mg/dL — ABNORMAL LOW (ref 0.3–1.2)
Total Protein: 6.6 g/dL (ref 6.5–8.1)

## 2020-11-18 LAB — CBC
HCT: 35.4 % — ABNORMAL LOW (ref 36.0–46.0)
Hemoglobin: 11.5 g/dL — ABNORMAL LOW (ref 12.0–15.0)
MCH: 32.3 pg (ref 26.0–34.0)
MCHC: 32.5 g/dL (ref 30.0–36.0)
MCV: 99.4 fL (ref 80.0–100.0)
Platelets: 323 10*3/uL (ref 150–400)
RBC: 3.56 MIL/uL — ABNORMAL LOW (ref 3.87–5.11)
RDW: 13 % (ref 11.5–15.5)
WBC: 8.2 10*3/uL (ref 4.0–10.5)
nRBC: 0 % (ref 0.0–0.2)

## 2020-11-18 LAB — RESP PANEL BY RT-PCR (FLU A&B, COVID) ARPGX2
Influenza A by PCR: NEGATIVE
Influenza B by PCR: NEGATIVE
SARS Coronavirus 2 by RT PCR: NEGATIVE

## 2020-11-18 MED ORDER — FENTANYL CITRATE (PF) 100 MCG/2ML IJ SOLN
50.0000 ug | Freq: Once | INTRAMUSCULAR | Status: AC
  Administered 2020-11-18: 50 ug via INTRAVENOUS
  Filled 2020-11-18: qty 2

## 2020-11-18 MED ORDER — ONDANSETRON HCL 4 MG/2ML IJ SOLN
4.0000 mg | Freq: Once | INTRAMUSCULAR | Status: AC
  Administered 2020-11-18: 4 mg via INTRAVENOUS
  Filled 2020-11-18: qty 2

## 2020-11-18 MED ORDER — SODIUM CHLORIDE 0.9 % IV BOLUS
500.0000 mL | Freq: Once | INTRAVENOUS | Status: AC
  Administered 2020-11-18: 500 mL via INTRAVENOUS

## 2020-11-18 NOTE — ED Triage Notes (Signed)
Patient here from home reporting that she was sent in by dr for ongoing n/v/back pain. Pyleonephritis not responding to outpatient treatment. Recommending inpatient treatment.

## 2020-11-18 NOTE — ED Notes (Signed)
Pt's son contact and updated about pt's care.

## 2020-11-19 LAB — URINALYSIS, ROUTINE W REFLEX MICROSCOPIC
Bacteria, UA: NONE SEEN
Bilirubin Urine: NEGATIVE
Glucose, UA: NEGATIVE mg/dL
Ketones, ur: NEGATIVE mg/dL
Leukocytes,Ua: NEGATIVE
Nitrite: NEGATIVE
Protein, ur: NEGATIVE mg/dL
Specific Gravity, Urine: 1.008 (ref 1.005–1.030)
pH: 6 (ref 5.0–8.0)

## 2020-11-19 MED ORDER — DOXYCYCLINE HYCLATE 100 MG PO CAPS: 100.0000 mg | ORAL_CAPSULE | Freq: Two times a day (BID) | ORAL | 0 refills | Status: AC

## 2020-11-19 MED ORDER — ONDANSETRON 4 MG PO TBDP: 4.0000 mg | ORAL_TABLET | Freq: Three times a day (TID) | ORAL | 0 refills | Status: DC | PRN

## 2020-11-19 NOTE — ED Provider Notes (Signed)
[facility] DEPT Provider Note   CSN: [id number] Arrival date & time: 11/18/20  1915     History Chief Complaint  Patient presents with  . Nausea  . Emesis  . Back Pain  . Anorexia    Marie Lyons is a 70 y.o. female.  Reports that she has been feeling generally unwell over the past couple weeks, saw her primary care doctor through a virtual visit and was concern for possibility of urinary tract infection and was treated with ciprofloxacin.  Reports that she took the medicine but does not feel that her symptoms have improved.  She reports that she has had general malaise, nausea, occasional episodes of emesis as well as back pain.  States pain is on her sides, both sides.  Lower.  Nonradiating, no alleviating or aggravating factors.  No dysuria or hematuria.  No cough or difficulty in breathing, no chest pain.  No fevers.  On review of systems also endorsed having headache, not sudden onset, not worst headache of her life, dull achy, frontal.  HPI     Past Medical History:  Diagnosis Date  . Cardiac arrhythmia   . Chronic back pain   . High cholesterol   . Migraine   . Scoliosis   . Stroke [facility])     Patient Active Problem List   Diagnosis Date Noted  . Major depressive disorder, recurrent episode, severe ([place]) 01/05/2018  . COPD exacerbation ([place])   . Influenza with pneumonia   . Community acquired pneumonia of right middle lobe of lung 02/12/2017  . Acute respiratory failure with hypoxia ([place]) 02/12/2017  . Tobacco user 02/12/2017  . Essential hypertension 02/12/2017  . HLD (hyperlipidemia) 02/12/2017    Past Surgical History:  Procedure Laterality Date  . ABDOMINAL HYSTERECTOMY    . BUNIONECTOMY    . ROTATOR CUFF REPAIR       OB History   No obstetric history on file.     Family History  Problem Relation Age of Onset  . CAD Father   . Stroke Neg Hx     Social History   Tobacco Use  . Smoking status: Current Every Day  Smoker    Packs/day: 0.50    Years: 25.00    Pack years: 12.50    Types: Cigarettes  . Smokeless tobacco: Never Used  Substance Use Topics  . Alcohol use: No  . Drug use: No    Home Medications Prior to Admission medications   Medication Sig Start Date End Date Taking? Authorizing Provider  acetaminophen (TYLENOL) 500 MG tablet Take 500 mg by mouth every 6 (six) hours as needed for headache (or pain).     [provider]  aspirin 81 MG tablet Take 81 mg by mouth daily.    [provider]  calcium elemental as carbonate (TUMS ULTRA 1000) 400 MG tablet Chew 400-800 mg by mouth daily as needed for heartburn.     [provider]  cholecalciferol (VITAMIN D) 1000 UNITS tablet Take 1,000 Units by mouth daily.     [provider]  cyclobenzaprine (FLEXERIL) 10 MG tablet Take 5-10 mg by mouth at bedtime as needed for muscle spasms. 11/14/17   [provider]  doxycycline (VIBRAMYCIN) 100 MG capsule Take 1 capsule (100 mg total) by mouth 2 (two) times daily for 7 days. 11/19/20 11/26/20  Lucrezia Starch, MD  escitalopram (LEXAPRO) 20 MG tablet Take 20 mg by mouth daily. 11/14/17   [provider]  estradiol (  VIVELLE-DOT) 0.1 MG/24HR patch Place 1 patch onto the skin every Wednesday.    [provider]  fluticasone (FLONASE) 50 MCG/ACT nasal spray Place 2 sprays into the nose daily. Patient not taking: Reported on 02/12/2017 07/25/13   Roselee Culver, MD  HYDROcodone-acetaminophen (NORCO/VICODIN) 5-325 MG per tablet Take 1 tablet by mouth every 6 (six) hours as needed for moderate pain. 09/08/14   Lawyer, Harrell Gave, PA-C  MELATONIN PO Take 1 tablet by mouth at bedtime.    [provider]  methocarbamol (ROBAXIN) 500 MG tablet Take 1 tablet (500 mg total) by mouth 4 (four) times daily. Patient not taking: Reported on 01/04/2018 02/15/17   Reyne Dumas, MD  metoprolol succinate (TOPROL-XL) 25 MG 24 hr tablet Take 25 mg by  mouth at bedtime.    [provider]  simvastatin (ZOCOR) 20 MG tablet Take 20 mg by mouth at bedtime.    [provider]  VENTOLIN HFA 108 (90 Base) MCG/ACT inhaler Inhale 2 puffs into the lungs every 4 (four) hours as needed for wheezing or shortness of breath. 10/17/17   [provider]  vitamin C (ASCORBIC ACID) 250 MG tablet Take 250 mg by mouth daily.    [provider]    Allergies    Ciprofloxacin; Penicillins; Aspirin; Darvon [propoxyphene hcl]; Ibuprofen; Imitrex [sumatriptan]; Meperidine; Monosodium glutamate; Nitrates, organic; Nsaids; and Sulfa antibiotics  Review of Systems   Review of Systems  Constitutional: Positive for fatigue. Negative for chills and fever.  HENT: Negative for ear pain and sore throat.   Eyes: Negative for pain and visual disturbance.  Respiratory: Negative for cough and shortness of breath.   Cardiovascular: Negative for chest pain and palpitations.  Gastrointestinal: Negative for abdominal pain and vomiting.  Genitourinary: Positive for flank pain. Negative for dysuria and hematuria.  Musculoskeletal: Positive for back pain. Negative for arthralgias.  Skin: Negative for color change and rash.  Neurological: Positive for weakness. Negative for seizures and syncope.  All other systems reviewed and are negative.   Physical Exam Updated Vital Signs BP (!) 147/72   Pulse 71   Temp 97.7 F (36.5 C) (Oral)   Resp (!) 21   SpO2 91%   Physical Exam Vitals and nursing note reviewed.  Constitutional:      General: She is not in acute distress.    Appearance: She is well-developed.  HENT:     Head: Normocephalic and atraumatic.  Eyes:     Conjunctiva/sclera: Conjunctivae normal.  Cardiovascular:     Rate and Rhythm: Normal rate and regular rhythm.     Heart sounds: No murmur heard.   Pulmonary:     Effort: Pulmonary effort is normal. No respiratory distress.     Breath sounds: Normal breath sounds.   Abdominal:     Palpations: Abdomen is soft.     Tenderness: There is no abdominal tenderness.  Musculoskeletal:     Cervical back: Neck supple.     Comments: No midline CT or L-spine tenderness, there is bilateral tenderness to lower flanks  Skin:    General: Skin is warm and dry.  Neurological:     General: No focal deficit present.     Mental Status: She is alert.  Psychiatric:        Mood and Affect: Mood normal.        Behavior: Behavior normal.     ED Results / Procedures / Treatments   Labs (all labs ordered are listed, but only abnormal results are  displayed) Labs Reviewed  URINALYSIS, ROUTINE W REFLEX MICROSCOPIC - Abnormal; Notable for the following components:      Result Value   Color, Urine STRAW (*)    Hgb urine dipstick SMALL (*)    All other components within normal limits  BASIC METABOLIC PANEL - Abnormal; Notable for the following components:   Sodium 134 (*)    Chloride 97 (*)    All other components within normal limits  CBC - Abnormal; Notable for the following components:   RBC 3.56 (*)    Hemoglobin 11.5 (*)    HCT 35.4 (*)    All other components within normal limits  HEPATIC FUNCTION PANEL - Abnormal; Notable for the following components:   Albumin 3.2 (*)    Total Bilirubin 0.2 (*)    Indirect Bilirubin 0.1 (*)    All other components within normal limits  RESP PANEL BY RT-PCR (FLU A&B, COVID) [id number]  URINE CULTURE    EKG None  Radiology DG Chest 2 View  Result Date: 11/18/2020 CLINICAL DATA:  Generalized weakness, nausea, vomiting and back pain EXAM: CHEST - 2 VIEW COMPARISON:  Radiograph 01/04/2018 FINDINGS: Low volumes with some increased interstitial markings as well as fissural and septal thickening and pulmonary vascular congestion. More patchy opacities present in the periphery of the right lung. No pneumothorax or visible effusion. Tortuous and calcified aorta is similar to comparison studies. Remaining cardiomediastinal contours  are unremarkable. Telemetry leads overlie the chest. Levocurvature of the thoracic spine is again noted. Degenerative changes are present in the imaged spine and shoulders. No acute osseous or soft tissue abnormality. IMPRESSION: Patchy opacities are present the periphery of the right lower lung which could reflect pneumonia or developing alveolar edema given additional features of more interstitial edematous change. Aortic Atherosclerosis (ICD10-I70.0). Electronically Signed   By: Lovena Le M.D.   On: 11/18/2020 22:58   CT Head Wo Contrast  Result Date: 11/18/2020 CLINICAL DATA:  Headache. EXAM: CT HEAD WITHOUT CONTRAST TECHNIQUE: Contiguous axial images were obtained from the base of the skull through the vertex without intravenous contrast. COMPARISON:  November 10, 2020 FINDINGS: Brain: There is mild cerebral atrophy with widening of the extra-axial spaces and ventricular dilatation. There are areas of decreased attenuation within the white matter tracts of the supratentorial brain, consistent with microvascular disease changes. Vascular: No hyperdense vessel or unexpected calcification. Skull: Normal. Negative for fracture or focal lesion. Sinuses/Orbits: No acute finding. Other: None. IMPRESSION: 1. Generalized cerebral atrophy. 2. No acute intracranial abnormality. Electronically Signed   By: Virgina Norfolk M.D.   On: 11/18/2020 21:45   CT Renal Stone Study  Result Date: 11/18/2020 CLINICAL DATA:  Back pain EXAM: CT ABDOMEN AND PELVIS WITHOUT CONTRAST TECHNIQUE: Multidetector CT imaging of the abdomen and pelvis was performed following the standard protocol without IV contrast. COMPARISON:  None. FINDINGS: Lower chest: Nodular ground-glass airspace opacities are noted in both lower lobes. I suspect these are infectious or inflammatory. No effusions. Heart is normal size. Hepatobiliary: No focal hepatic abnormality. Gallbladder unremarkable. Pancreas: No focal abnormality or ductal dilatation.  Spleen: No focal abnormality.  Normal size. Adrenals/Urinary Tract: No adrenal abnormality. No focal renal abnormality. No stones or hydronephrosis. Urinary bladder is unremarkable. Stomach/Bowel: Stomach, large and small bowel grossly unremarkable. Moderate stool burden Vascular/Lymphatic: Aortic atherosclerosis. No evidence of aneurysm or adenopathy. Reproductive: Prior hysterectomy.  No adnexal masses. Other: No free fluid or free air. Musculoskeletal: No acute bony abnormality. Degenerative changes and scoliosis in the lumbar spine.  IMPRESSION: No renal or ureteral stones.  No hydronephrosis. Nodular opacities and nodular ground-glass densities in both lower lobes. While a component of this could reflect scarring, cannot exclude infectious or inflammatory process. Aortic atherosclerosis. Moderate stool burden throughout the colon. Electronically Signed   By: Rolm Baptise M.D.   On: 11/18/2020 21:40    Procedures Procedures (including critical care time)  Medications Ordered in ED Medications  sodium chloride 0.9 % bolus 500 mL (0 mLs Intravenous Stopped 11/18/20 2254)  ondansetron (ZOFRAN) injection 4 mg (4 mg Intravenous Given 11/18/20 2148)  fentaNYL (SUBLIMAZE) injection 50 mcg (50 mcg Intravenous Given 11/18/20 2148)    ED Course  I have reviewed the triage vital signs and the nursing notes.  Pertinent labs & imaging results that were available during my care of the patient were reviewed by me and considered in my medical decision making (see chart for details).    MDM Rules/Calculators/A&P                         70 year old lady presents to ER with concern for general malaise, back pain, nausea, flank pain, headache.  On exam patient is noted to be remarkably well-appearing with normal vital signs.  Her lab work was grossly within normal limits.  CT scan of abdomen pelvis negative for acute pathology, radiologist commented on groundglass opacity at the bases of the lungs.  CXR with  findings concerning for either edema or pneumonia.  Her urine sample was negative for UTI.  Will treat possible pneumonia with course of antibiotics.  After the discussed management above, the patient was determined to be safe for discharge.  The patient was in agreement with this plan and all questions regarding their care were answered.  ED return precautions were discussed and the patient will return to the ED with any significant worsening of condition.  Final Clinical Impression(s) / ED Diagnoses Final diagnoses:  Community acquired pneumonia, unspecified laterality    Rx / DC Orders ED Discharge Orders         Ordered    doxycycline (VIBRAMYCIN) 100 MG capsule  2 times daily        11/19/20 0025           Lucrezia Starch, MD 11/19/20 [phone]

## 2020-11-19 NOTE — Discharge Instructions (Addendum)
Please schedule appointment with your primary doctor for recheck early next week.  Return to ER if you develop any difficulty in breathing, chest pain, abdominal pain, vomiting or other new concerning symptom.  Take antibiotic as prescribed for the possible pneumonia.

## 2020-11-20 LAB — URINE CULTURE: Culture: NO GROWTH

## 2020-12-01 DIAGNOSIS — R8290 Unspecified abnormal findings in urine: Secondary | ICD-10-CM | POA: Diagnosis not present

## 2020-12-01 DIAGNOSIS — R5383 Other fatigue: Secondary | ICD-10-CM | POA: Diagnosis not present

## 2020-12-01 DIAGNOSIS — J101 Influenza due to other identified influenza virus with other respiratory manifestations: Secondary | ICD-10-CM | POA: Diagnosis not present

## 2020-12-01 DIAGNOSIS — E441 Mild protein-calorie malnutrition: Secondary | ICD-10-CM | POA: Diagnosis not present

## 2020-12-01 DIAGNOSIS — N390 Urinary tract infection, site not specified: Secondary | ICD-10-CM | POA: Diagnosis not present

## 2020-12-08 DIAGNOSIS — N3020 Other chronic cystitis without hematuria: Secondary | ICD-10-CM | POA: Diagnosis not present

## 2020-12-08 DIAGNOSIS — J410 Simple chronic bronchitis: Secondary | ICD-10-CM | POA: Diagnosis not present

## 2020-12-08 DIAGNOSIS — I10 Essential (primary) hypertension: Secondary | ICD-10-CM | POA: Diagnosis not present

## 2020-12-08 DIAGNOSIS — Z23 Encounter for immunization: Secondary | ICD-10-CM | POA: Diagnosis not present

## 2020-12-08 DIAGNOSIS — M8580 Other specified disorders of bone density and structure, unspecified site: Secondary | ICD-10-CM | POA: Diagnosis not present

## 2020-12-08 DIAGNOSIS — E7800 Pure hypercholesterolemia, unspecified: Secondary | ICD-10-CM | POA: Diagnosis not present

## 2020-12-08 DIAGNOSIS — F17200 Nicotine dependence, unspecified, uncomplicated: Secondary | ICD-10-CM | POA: Diagnosis not present

## 2020-12-08 DIAGNOSIS — E46 Unspecified protein-calorie malnutrition: Secondary | ICD-10-CM | POA: Diagnosis not present

## 2020-12-08 DIAGNOSIS — F324 Major depressive disorder, single episode, in partial remission: Secondary | ICD-10-CM | POA: Diagnosis not present

## 2020-12-08 DIAGNOSIS — M545 Low back pain, unspecified: Secondary | ICD-10-CM | POA: Diagnosis not present

## 2020-12-08 DIAGNOSIS — M62838 Other muscle spasm: Secondary | ICD-10-CM | POA: Diagnosis not present

## 2020-12-08 DIAGNOSIS — Z0000 Encounter for general adult medical examination without abnormal findings: Secondary | ICD-10-CM | POA: Diagnosis not present

## 2020-12-16 ENCOUNTER — Encounter (HOSPITAL_COMMUNITY): Payer: Self-pay

## 2020-12-16 ENCOUNTER — Emergency Department (HOSPITAL_COMMUNITY)
Admission: EM | Admit: 2020-12-16 | Discharge: 2020-12-16 | Disposition: A | Discharge status: Home / Self Care | Payer: Medicare HMO | Attending: Emergency Medicine | Admitting: Emergency Medicine

## 2020-12-16 DIAGNOSIS — R0902 Hypoxemia: Secondary | ICD-10-CM | POA: Diagnosis not present

## 2020-12-16 DIAGNOSIS — Z7982 Long term (current) use of aspirin: Secondary | ICD-10-CM | POA: Diagnosis not present

## 2020-12-16 DIAGNOSIS — J441 Chronic obstructive pulmonary disease with (acute) exacerbation: Secondary | ICD-10-CM | POA: Diagnosis not present

## 2020-12-16 DIAGNOSIS — F17210 Nicotine dependence, cigarettes, uncomplicated: Secondary | ICD-10-CM | POA: Insufficient documentation

## 2020-12-16 DIAGNOSIS — T7840XA Allergy, unspecified, initial encounter: Secondary | ICD-10-CM

## 2020-12-16 DIAGNOSIS — I10 Essential (primary) hypertension: Secondary | ICD-10-CM | POA: Insufficient documentation

## 2020-12-16 DIAGNOSIS — Z79899 Other long term (current) drug therapy: Secondary | ICD-10-CM | POA: Insufficient documentation

## 2020-12-16 DIAGNOSIS — T782XXA Anaphylactic shock, unspecified, initial encounter: Secondary | ICD-10-CM | POA: Diagnosis not present

## 2020-12-16 DIAGNOSIS — R0602 Shortness of breath: Secondary | ICD-10-CM | POA: Diagnosis present

## 2020-12-16 LAB — BASIC METABOLIC PANEL
Anion gap: 10 (ref 5–15)
BUN: 15 mg/dL (ref 8–23)
CO2: 27 mmol/L (ref 22–32)
Calcium: 8.7 mg/dL — ABNORMAL LOW (ref 8.9–10.3)
Chloride: 101 mmol/L (ref 98–111)
Creatinine, Ser: 0.69 mg/dL (ref 0.44–1.00)
GFR, Estimated: >60 mL/min (ref >60)
Glucose, Bld: 75 mg/dL (ref 70–99)
Potassium: 3 mmol/L — ABNORMAL LOW (ref 3.5–5.1)
Sodium: 138 mmol/L (ref 135–145)

## 2020-12-16 LAB — CBC WITH DIFFERENTIAL/PLATELET
Abs Immature Granulocytes: 0.03 10*3/uL (ref 0.00–0.07)
Basophils Absolute: 0.1 10*3/uL (ref 0.0–0.1)
Basophils Relative: 1 %
Eosinophils Absolute: 0.1 10*3/uL (ref 0.0–0.5)
Eosinophils Relative: 2 %
HCT: 36.7 % (ref 36.0–46.0)
Hemoglobin: 11.5 g/dL — ABNORMAL LOW (ref 12.0–15.0)
Immature Granulocytes: 0 %
Lymphocytes Relative: 32 %
Lymphs Abs: 2.5 10*3/uL (ref 0.7–4.0)
MCH: 31.7 pg (ref 26.0–34.0)
MCHC: 31.3 g/dL (ref 30.0–36.0)
MCV: 101.1 fL — ABNORMAL HIGH (ref 80.0–100.0)
Monocytes Absolute: 0.8 10*3/uL (ref 0.1–1.0)
Monocytes Relative: 10 %
Neutro Abs: 4.4 10*3/uL (ref 1.7–7.7)
Neutrophils Relative %: 55 %
Platelets: 221 10*3/uL (ref 150–400)
RBC: 3.63 MIL/uL — ABNORMAL LOW (ref 3.87–5.11)
RDW: 13.6 % (ref 11.5–15.5)
WBC: 7.9 10*3/uL (ref 4.0–10.5)
nRBC: 0 % (ref 0.0–0.2)

## 2020-12-16 LAB — URINALYSIS, ROUTINE W REFLEX MICROSCOPIC
Bilirubin Urine: NEGATIVE
Glucose, UA: NEGATIVE mg/dL
Hgb urine dipstick: NEGATIVE
Ketones, ur: NEGATIVE mg/dL
Leukocytes,Ua: NEGATIVE
Nitrite: NEGATIVE
Protein, ur: NEGATIVE mg/dL
Specific Gravity, Urine: 1.003 — ABNORMAL LOW (ref 1.005–1.030)
pH: 7 (ref 5.0–8.0)

## 2020-12-16 MED ORDER — FAMOTIDINE IN NACL 20-0.9 MG/50ML-% IV SOLN
20.0000 mg | Freq: Once | INTRAVENOUS | Status: AC
  Administered 2020-12-16: 20 mg via INTRAVENOUS
  Filled 2020-12-16: qty 50

## 2020-12-16 MED ORDER — SODIUM CHLORIDE 0.9 % IV BOLUS
500.0000 mL | Freq: Once | INTRAVENOUS | Status: AC
  Administered 2020-12-16: 500 mL via INTRAVENOUS

## 2020-12-16 MED ORDER — DIPHENHYDRAMINE HCL 25 MG PO TABS: 25.0000 mg | ORAL_TABLET | Freq: Four times a day (QID) | ORAL | 0 refills | Status: AC | PRN

## 2020-12-16 MED ORDER — FAMOTIDINE 20 MG PO TABS: 20.0000 mg | ORAL_TABLET | Freq: Every day | ORAL | 0 refills | Status: DC

## 2020-12-16 MED ORDER — POTASSIUM CHLORIDE CRYS ER 20 MEQ PO TBCR
40.0000 meq | EXTENDED_RELEASE_TABLET | Freq: Once | ORAL | Status: AC
  Administered 2020-12-16: 40 meq via ORAL
  Filled 2020-12-16: qty 2

## 2020-12-16 MED ORDER — EPINEPHRINE 0.3 MG/0.3ML IJ SOAJ: 0.3000 mg | INTRAMUSCULAR | 0 refills | Status: AC | PRN

## 2020-12-16 NOTE — ED Triage Notes (Signed)
Pt arrived via EMS, sent by PCP, cephalosporin rx called in over phone, had anaphylactic reaction, went to PCP with trouble breathing, throat closing, nausea, hypotension, spo2 80% RA  Epi 0.3 @ 1708 Solumedrol 125 benadryl 50 zofran 4 mg

## 2020-12-16 NOTE — ED Notes (Signed)
Patient did not want to take Potassium until she had something on her stomach

## 2020-12-16 NOTE — ED Provider Notes (Signed)
[place] [facility] DEPT Provider Note   CSN: [id number] Arrival date & time: 12/16/20  1752     History Chief Complaint  Patient presents with  . Allergic Reaction    Marie Lyons is a 70 y.o. female with a past medical history significant for chronic back pain, hyperlipidemia, and history of CVA who presents to the ED via EMS due to anaphylaxis from PCP. Patient states she has had a kidney infection for the past 2 months. She was previously treated with ciprofloxacin with no improvement in symptoms and recently given a cephalosporin. Patient states she took her first dose of the cephalosporin at 4:30 PM this afternoon when she had an anaphylactic reaction. Unsure which cephalosporin. History of PCN allergy. Patient states she felt like her throat was closing up and experienced shortness of breath. Patient reported to her PCP where 0.3 mg of epinephrine was given. EMS then gave patient 125 Solu-Medrol, 50 Benadryl, and 4 mg of Zofran. Patient admits to generalized abdominal pain associated with nausea. Denies rash. Patient states her symptoms have improved after the medications. Denies current shortness of breath and sensation of her throat closing up. She admits to continued dysuria, frequency, and difficulties urinating. Denies fever and chills. History of chronic UTIs.   History obtained from patient and past medical records. No interpreter used during encounter.      Past Medical History:  Diagnosis Date  . Cardiac arrhythmia   . Chronic back pain   . High cholesterol   . Migraine   . Scoliosis   . Stroke [facility])     Patient Active Problem List   Diagnosis Date Noted  . Major depressive disorder, recurrent episode, severe ([place]) 01/05/2018  . COPD exacerbation ([place])   . Influenza with pneumonia   . Community acquired pneumonia of right middle lobe of lung 02/12/2017  . Acute respiratory failure with hypoxia ([place]) 02/12/2017  . Tobacco user 02/12/2017   . Essential hypertension 02/12/2017  . HLD (hyperlipidemia) 02/12/2017    Past Surgical History:  Procedure Laterality Date  . ABDOMINAL HYSTERECTOMY    . BUNIONECTOMY    . ROTATOR CUFF REPAIR       OB History   No obstetric history on file.     Family History  Problem Relation Age of Onset  . CAD Father   . Stroke Neg Hx     Social History   Tobacco Use  . Smoking status: Current Every Day Smoker    Packs/day: 0.50    Years: 25.00    Pack years: 12.50    Types: Cigarettes  . Smokeless tobacco: Never Used  Substance Use Topics  . Alcohol use: No  . Drug use: No    Home Medications Prior to Admission medications   Medication Sig Start Date End Date Taking? Authorizing Provider  acetaminophen (TYLENOL) 500 MG tablet Take 500 mg by mouth every 6 (six) hours as needed for headache (or pain).     [provider]  aspirin 81 MG tablet Take 81 mg by mouth daily.    [provider]  calcium elemental as carbonate (TUMS ULTRA 1000) 400 MG tablet Chew 400-800 mg by mouth daily as needed for heartburn.     [provider]  cholecalciferol (VITAMIN D) 1000 UNITS tablet Take 1,000 Units by mouth daily.     [provider]  cyclobenzaprine (FLEXERIL) 10 MG tablet Take 5-10 mg by mouth at bedtime as needed for muscle spasms. 11/14/17   [provider]  diphenhydrAMINE (BENADRYL) 25 MG tablet Take 1 tablet (25 mg total) by mouth every 6 (six) hours as needed. 12/16/20   Suzy Bouchard, PA-C  EPINEPHrine 0.3 mg/0.3 mL IJ SOAJ injection Inject 0.3 mg into the muscle as needed for anaphylaxis. 12/16/20   Suzy Bouchard, PA-C  escitalopram (LEXAPRO) 20 MG tablet Take 20 mg by mouth daily. 11/14/17   [provider]  estradiol (VIVELLE-DOT) 0.1 MG/24HR patch Place 1 patch onto the skin every Wednesday.    [provider]  famotidine (PEPCID) 20 MG tablet Take 1 tablet (20 mg total) by mouth daily. 12/16/20    Suzy Bouchard, PA-C  fluticasone (FLONASE) 50 MCG/ACT nasal spray Place 2 sprays into the nose daily. Patient not taking: Reported on 02/12/2017 07/25/13   Roselee Culver, MD  HYDROcodone-acetaminophen (NORCO/VICODIN) 5-325 MG per tablet Take 1 tablet by mouth every 6 (six) hours as needed for moderate pain. 09/08/14   Lawyer, Harrell Gave, PA-C  MELATONIN PO Take 1 tablet by mouth at bedtime.    [provider]  methocarbamol (ROBAXIN) 500 MG tablet Take 1 tablet (500 mg total) by mouth 4 (four) times daily. Patient not taking: Reported on 01/04/2018 02/15/17   Reyne Dumas, MD  metoprolol succinate (TOPROL-XL) 25 MG 24 hr tablet Take 25 mg by mouth at bedtime.    [provider]  ondansetron (ZOFRAN ODT) 4 MG disintegrating tablet Take 1 tablet (4 mg total) by mouth every 8 (eight) hours as needed for nausea or vomiting. 11/19/20   Lucrezia Starch, MD  simvastatin (ZOCOR) 20 MG tablet Take 20 mg by mouth at bedtime.    [provider]  VENTOLIN HFA 108 (90 Base) MCG/ACT inhaler Inhale 2 puffs into the lungs every 4 (four) hours as needed for wheezing or shortness of breath. 10/17/17   [provider]  vitamin C (ASCORBIC ACID) 250 MG tablet Take 250 mg by mouth daily.    [provider]    Allergies    Ciprofloxacin; Penicillins; Aspirin; Darvon [propoxyphene hcl]; Ibuprofen; Imitrex [sumatriptan]; Meperidine; Monosodium glutamate; Nitrates, organic; Nsaids; and Sulfa antibiotics  Review of Systems   Review of Systems  Constitutional: Negative for chills and fever.  Respiratory: Positive for shortness of breath (resolved).   Gastrointestinal: Positive for abdominal pain (resolved) and nausea (resolved). Negative for vomiting.  Genitourinary: Positive for difficulty urinating, dysuria and frequency.  Skin: Negative for rash.  All other systems reviewed and are negative.   Physical Exam Updated Vital Signs BP 123/76 (BP Location: Right  Arm)   Pulse 95   Temp 98.4 F (36.9 C) (Oral)   Resp 20   SpO2 98%   Physical Exam Vitals and nursing note reviewed.  Constitutional:      General: She is not in acute distress.    Appearance: She is not toxic-appearing.  HENT:     Head: Normocephalic.     Mouth/Throat:     Comments: Airway patent. Dry mucous membranes. Eyes:     Pupils: Pupils are equal, round, and reactive to light.  Cardiovascular:     Rate and Rhythm: Normal rate and regular rhythm.     Pulses: Normal pulses.     Heart sounds: Normal heart sounds. No murmur heard. No friction rub. No gallop.   Pulmonary:     Effort: Pulmonary effort is normal.     Breath sounds: Wheezing present.     Comments: Late expiratory wheeze heard throughout. Abdominal:  General: Abdomen is flat. Bowel sounds are normal. There is no distension.     Palpations: Abdomen is soft.     Tenderness: There is no abdominal tenderness. There is left CVA tenderness. There is no right CVA tenderness, guarding or rebound.     Comments: Positive left CVA tenderness.  Musculoskeletal:     Cervical back: Neck supple.     Comments: Able to move all 4 extremities without difficulty  Skin:    Comments: No rash.  Neurological:     General: No focal deficit present.     Mental Status: She is alert.  Psychiatric:        Mood and Affect: Mood normal.        Behavior: Behavior normal.     ED Results / Procedures / Treatments   Labs (all labs ordered are listed, but only abnormal results are displayed) Labs Reviewed  CBC WITH DIFFERENTIAL/PLATELET - Abnormal; Notable for the following components:      Result Value   RBC 3.63 (*)    Hemoglobin 11.5 (*)    MCV 101.1 (*)    All other components within normal limits  BASIC METABOLIC PANEL - Abnormal; Notable for the following components:   Potassium 3.0 (*)    Calcium 8.7 (*)    All other components within normal limits  URINALYSIS, ROUTINE W REFLEX MICROSCOPIC - Abnormal; Notable for  the following components:   Color, Urine COLORLESS (*)    Specific Gravity, Urine 1.003 (*)    All other components within normal limits  URINE CULTURE    EKG None  Radiology No results found.  Procedures Procedures (including critical care time)  Medications Ordered in ED Medications  famotidine (PEPCID) IVPB 20 mg premix (0 mg Intravenous Stopped 12/16/20 1913)  sodium chloride 0.9 % bolus 500 mL (0 mLs Intravenous Stopped 12/16/20 1937)  potassium chloride SA (KLOR-CON) CR tablet 40 mEq (40 mEq Oral Given 12/16/20 2106)    ED Course  I have reviewed the triage vital signs and the nursing notes.  Pertinent labs & imaging results that were available during my care of the patient were reviewed by me and considered in my medical decision making (see chart for details).  Clinical Course as of 12/16/20 2139  Fri Dec 16, 2020  1925 Potassium(!): 3.0 [CA]    Clinical Course User Index [CA] Karie Kirks   MDM Rules/Calculators/A&P                         70 year old female presents to the ED via EMS after anaphylactic reaction to a cephalosporin for pyelonephritis. Patient states she has had pyelonephritis for 2 months and was previously treated with Ciprofloxacin with no improvement in symptoms and was given an unknown cephalosporin today in which she experienced an anaphylactic reaction. History of PCN allergy. Patient given epinephrine, Solu-Medrol, Benadryl, and Zofran prior to arrival with improvement in symptoms. Upon arrival, vitals all within normal limits. Patient in no acute distress and nontoxic-appearing. Physical exam significant for left CVA tenderness. Abdomen soft, nondistended, nontender. No rash. Airway patent. Expiratory wheeze heard throughout. Will obtain routine labs and UA to rule out urinary tract infection. Chart reviewed. Patient was seen in the ED on 11/19/2020 and her UA was negative for signs of infection CT abdomen was unremarkable with no  signs of pyelonephritis. Will add IV pepcid. Will observe for 4 hours after epinephrine.  CBC reassuring with no leukocytosis.  Mild anemia  with hemoglobin 11.5.  BMP significant for hypokalemia at 3.  Potassium repleted here in the ED.  7:29 PM reassessed patient at bedside who notes continued improvement in symptoms. Mild expiratory wheeze which appears to have improved. Airway patent. Abdomen soft, non-distended, and non-tender. No rash.   UA negative for signs of infection.  Low suspicion for pyelonephritis or acute cystitis.  9:00 PM reassessed patient at bedside and notes complete resolution of symptoms.  Lungs clear to auscultation bilaterally.  Patient maintaining O2 saturation above 90% without difficulty.  Airway patent.  Will discharge patient with Pepcid and Benadryl.  Prescription for EpiPen given to patient at discharge.  Advised patient not to take anymore of her antibiotics due to allergic reaction and no signs of acute cystitis in urine. Advised patient to follow-up with PCP within the next week. Strict ED precautions discussed with patient. Patient states understanding and agrees to plan. Patient discharged home in no acute distress and stable vitals.  Discussed case with Dr. Roderic Palau who evaluated patient at bedside and agrees with assessment and plan.  Final Clinical Impression(s) / ED Diagnoses Final diagnoses:  Allergic reaction, initial encounter    Rx / DC Orders ED Discharge Orders         Ordered    diphenhydrAMINE (BENADRYL) 25 MG tablet  Every 6 hours PRN        12/16/20 2135    famotidine (PEPCID) 20 MG tablet  Daily        12/16/20 2135    EPINEPHrine 0.3 mg/0.3 mL IJ SOAJ injection  As needed        12/16/20 2135           Karie Kirks 12/16/20 2209    Milton Ferguson, MD 12/16/20 2218

## 2020-12-16 NOTE — Discharge Instructions (Addendum)
As discussed, all of your labs are reassuring today.  Your urine was negative for signs of infection.  Urine culture is pending.  I am sending you home with Benadryl and Pepcid. Take for the next few days to help with your allergic reaction. I am also sending you home with an epi pen. Use epi pen if you feel like your throat is closing up. You will need to return to the ER if you use your epi pen. Do not take anymore of the antibiotics. Please inform your providers of your severe allergic reaction to penicillins and their cross reactants. Follow-up with your PCP within the next week for further evaluation. Return to the ER for new or worsening symptoms.

## 2020-12-19 LAB — URINE CULTURE: Culture: 20000 — AB

## 2020-12-20 ENCOUNTER — Telehealth: Payer: Self-pay | Admitting: Emergency Medicine

## 2020-12-20 NOTE — Telephone Encounter (Signed)
Post ED Visit - Positive Culture Follow-up  Culture report reviewed by antimicrobial stewardship pharmacist: [facility] Team []  Elenor Quinones, Pharm.D. []  Heide Guile, Pharm.D., BCPS AQ-ID []  Parks Neptune, Pharm.D., BCPS []  Alycia Rossetti, Pharm.D., BCPS []  [place], Pharm.D., BCPS, AAHIVP []  Legrand Como, Pharm.D., BCPS, AAHIVP []  Salome Arnt, PharmD, BCPS []  Johnnette Gourd, PharmD, BCPS []  Hughes Better, PharmD, BCPS []  Leeroy Cha, PharmD []  Laqueta Linden, PharmD, BCPS []  Albertina Parr, PharmD  [facility] Team []  Leodis Sias, PharmD []  Lindell Spar, PharmD []  Royetta Asal, PharmD []  Graylin Shiver, Rph []  Rema Fendt) Glennon Mac, PharmD [x]  Arlyn Dunning, PharmD []  Netta Cedars, PharmD []  Dia Sitter, PharmD []  Leone Haven, PharmD []  Gretta Arab, PharmD []  Theodis Shove, PharmD []  Peggyann Juba, PharmD []  Reuel Boom, PharmD   Positive urine culture Treated with none, asymptomatic, no further patient follow-up is required at this time.  Hazle Nordmann 12/20/2020, 1:04 PM

## 2021-01-30 DIAGNOSIS — J209 Acute bronchitis, unspecified: Secondary | ICD-10-CM | POA: Diagnosis not present

## 2021-01-30 DIAGNOSIS — M549 Dorsalgia, unspecified: Secondary | ICD-10-CM | POA: Diagnosis not present

## 2021-01-30 DIAGNOSIS — N3020 Other chronic cystitis without hematuria: Secondary | ICD-10-CM | POA: Diagnosis not present

## 2021-03-01 ENCOUNTER — Ambulatory Visit: Payer: Medicare HMO | Admitting: Internal Medicine

## 2021-03-01 DIAGNOSIS — F32A Depression, unspecified: Secondary | ICD-10-CM | POA: Insufficient documentation

## 2021-03-01 DIAGNOSIS — J45909 Unspecified asthma, uncomplicated: Secondary | ICD-10-CM | POA: Insufficient documentation

## 2021-03-08 ENCOUNTER — Other Ambulatory Visit: Payer: Self-pay

## 2021-03-08 ENCOUNTER — Emergency Department (HOSPITAL_BASED_OUTPATIENT_CLINIC_OR_DEPARTMENT_OTHER): Payer: Medicare HMO | Admitting: Radiology

## 2021-03-08 ENCOUNTER — Inpatient Hospital Stay (HOSPITAL_BASED_OUTPATIENT_CLINIC_OR_DEPARTMENT_OTHER)
Admission: EM | Admit: 2021-03-08 | Discharge: 2021-03-15 | DRG: 571 | Disposition: A | Discharge status: Home Health | Payer: Medicare HMO | Attending: Family Medicine | Admitting: Family Medicine

## 2021-03-08 ENCOUNTER — Emergency Department (HOSPITAL_BASED_OUTPATIENT_CLINIC_OR_DEPARTMENT_OTHER): Payer: Medicare HMO

## 2021-03-08 ENCOUNTER — Encounter (HOSPITAL_BASED_OUTPATIENT_CLINIC_OR_DEPARTMENT_OTHER): Payer: Self-pay

## 2021-03-08 DIAGNOSIS — F419 Anxiety disorder, unspecified: Secondary | ICD-10-CM | POA: Diagnosis present

## 2021-03-08 DIAGNOSIS — E871 Hypo-osmolality and hyponatremia: Secondary | ICD-10-CM | POA: Diagnosis present

## 2021-03-08 DIAGNOSIS — F32A Depression, unspecified: Secondary | ICD-10-CM | POA: Diagnosis present

## 2021-03-08 DIAGNOSIS — Z880 Allergy status to penicillin: Secondary | ICD-10-CM | POA: Diagnosis not present

## 2021-03-08 DIAGNOSIS — M19042 Primary osteoarthritis, left hand: Secondary | ICD-10-CM | POA: Diagnosis not present

## 2021-03-08 DIAGNOSIS — F17210 Nicotine dependence, cigarettes, uncomplicated: Secondary | ICD-10-CM | POA: Diagnosis present

## 2021-03-08 DIAGNOSIS — M65042 Abscess of tendon sheath, left hand: Secondary | ICD-10-CM | POA: Diagnosis not present

## 2021-03-08 DIAGNOSIS — E785 Hyperlipidemia, unspecified: Secondary | ICD-10-CM | POA: Diagnosis present

## 2021-03-08 DIAGNOSIS — Z681 Body mass index (BMI) 19 or less, adult: Secondary | ICD-10-CM

## 2021-03-08 DIAGNOSIS — S61452A Open bite of left hand, initial encounter: Secondary | ICD-10-CM | POA: Diagnosis not present

## 2021-03-08 DIAGNOSIS — M419 Scoliosis, unspecified: Secondary | ICD-10-CM | POA: Diagnosis present

## 2021-03-08 DIAGNOSIS — Z20822 Contact with and (suspected) exposure to covid-19: Secondary | ICD-10-CM | POA: Diagnosis present

## 2021-03-08 DIAGNOSIS — I10 Essential (primary) hypertension: Secondary | ICD-10-CM | POA: Diagnosis present

## 2021-03-08 DIAGNOSIS — S61459A Open bite of unspecified hand, initial encounter: Secondary | ICD-10-CM

## 2021-03-08 DIAGNOSIS — R609 Edema, unspecified: Secondary | ICD-10-CM

## 2021-03-08 DIAGNOSIS — W540XXD Bitten by dog, subsequent encounter: Secondary | ICD-10-CM | POA: Diagnosis not present

## 2021-03-08 DIAGNOSIS — S61552A Open bite of left wrist, initial encounter: Principal | ICD-10-CM

## 2021-03-08 DIAGNOSIS — A280 Pasteurellosis: Secondary | ICD-10-CM | POA: Diagnosis present

## 2021-03-08 DIAGNOSIS — M7121 Synovial cyst of popliteal space [Baker], right knee: Secondary | ICD-10-CM | POA: Diagnosis present

## 2021-03-08 DIAGNOSIS — E876 Hypokalemia: Secondary | ICD-10-CM | POA: Diagnosis present

## 2021-03-08 DIAGNOSIS — J449 Chronic obstructive pulmonary disease, unspecified: Secondary | ICD-10-CM | POA: Diagnosis present

## 2021-03-08 DIAGNOSIS — D509 Iron deficiency anemia, unspecified: Secondary | ICD-10-CM | POA: Diagnosis present

## 2021-03-08 DIAGNOSIS — Z8249 Family history of ischemic heart disease and other diseases of the circulatory system: Secondary | ICD-10-CM

## 2021-03-08 DIAGNOSIS — Z23 Encounter for immunization: Secondary | ICD-10-CM

## 2021-03-08 DIAGNOSIS — L0889 Other specified local infections of the skin and subcutaneous tissue: Secondary | ICD-10-CM | POA: Diagnosis not present

## 2021-03-08 DIAGNOSIS — S61552D Open bite of left wrist, subsequent encounter: Secondary | ICD-10-CM | POA: Diagnosis not present

## 2021-03-08 DIAGNOSIS — M009 Pyogenic arthritis, unspecified: Secondary | ICD-10-CM | POA: Diagnosis present

## 2021-03-08 DIAGNOSIS — R636 Underweight: Secondary | ICD-10-CM | POA: Diagnosis present

## 2021-03-08 DIAGNOSIS — Z8673 Personal history of transient ischemic attack (TIA), and cerebral infarction without residual deficits: Secondary | ICD-10-CM

## 2021-03-08 DIAGNOSIS — M71032 Abscess of bursa, left wrist: Secondary | ICD-10-CM | POA: Diagnosis not present

## 2021-03-08 DIAGNOSIS — L02519 Cutaneous abscess of unspecified hand: Secondary | ICD-10-CM | POA: Diagnosis not present

## 2021-03-08 DIAGNOSIS — E7800 Pure hypercholesterolemia, unspecified: Secondary | ICD-10-CM | POA: Diagnosis present

## 2021-03-08 DIAGNOSIS — E538 Deficiency of other specified B group vitamins: Secondary | ICD-10-CM | POA: Diagnosis present

## 2021-03-08 DIAGNOSIS — W540XXA Bitten by dog, initial encounter: Secondary | ICD-10-CM | POA: Diagnosis present

## 2021-03-08 DIAGNOSIS — D529 Folate deficiency anemia, unspecified: Secondary | ICD-10-CM | POA: Diagnosis not present

## 2021-03-08 DIAGNOSIS — M00832 Arthritis due to other bacteria, left wrist: Secondary | ICD-10-CM | POA: Diagnosis not present

## 2021-03-08 DIAGNOSIS — M25561 Pain in right knee: Secondary | ICD-10-CM | POA: Diagnosis not present

## 2021-03-08 DIAGNOSIS — G894 Chronic pain syndrome: Secondary | ICD-10-CM | POA: Diagnosis present

## 2021-03-08 DIAGNOSIS — B9689 Other specified bacterial agents as the cause of diseases classified elsewhere: Secondary | ICD-10-CM | POA: Diagnosis not present

## 2021-03-08 DIAGNOSIS — L089 Local infection of the skin and subcutaneous tissue, unspecified: Secondary | ICD-10-CM | POA: Diagnosis not present

## 2021-03-08 DIAGNOSIS — J9601 Acute respiratory failure with hypoxia: Secondary | ICD-10-CM | POA: Diagnosis not present

## 2021-03-08 DIAGNOSIS — Y9289 Other specified places as the place of occurrence of the external cause: Secondary | ICD-10-CM | POA: Diagnosis not present

## 2021-03-08 DIAGNOSIS — M19032 Primary osteoarthritis, left wrist: Secondary | ICD-10-CM | POA: Diagnosis not present

## 2021-03-08 DIAGNOSIS — Z882 Allergy status to sulfonamides status: Secondary | ICD-10-CM

## 2021-03-08 DIAGNOSIS — M79651 Pain in right thigh: Secondary | ICD-10-CM

## 2021-03-08 DIAGNOSIS — M25461 Effusion, right knee: Secondary | ICD-10-CM | POA: Diagnosis not present

## 2021-03-08 DIAGNOSIS — M65032 Abscess of tendon sheath, left forearm: Secondary | ICD-10-CM | POA: Diagnosis not present

## 2021-03-08 DIAGNOSIS — J42 Unspecified chronic bronchitis: Secondary | ICD-10-CM | POA: Diagnosis not present

## 2021-03-08 DIAGNOSIS — R531 Weakness: Secondary | ICD-10-CM | POA: Diagnosis not present

## 2021-03-08 DIAGNOSIS — S51852A Open bite of left forearm, initial encounter: Secondary | ICD-10-CM | POA: Diagnosis not present

## 2021-03-08 DIAGNOSIS — Z79899 Other long term (current) drug therapy: Secondary | ICD-10-CM

## 2021-03-08 DIAGNOSIS — Z886 Allergy status to analgesic agent status: Secondary | ICD-10-CM

## 2021-03-08 DIAGNOSIS — R062 Wheezing: Secondary | ICD-10-CM | POA: Diagnosis present

## 2021-03-08 DIAGNOSIS — M71162 Other infective bursitis, left knee: Secondary | ICD-10-CM | POA: Diagnosis not present

## 2021-03-08 DIAGNOSIS — S61452D Open bite of left hand, subsequent encounter: Secondary | ICD-10-CM | POA: Diagnosis not present

## 2021-03-08 DIAGNOSIS — Z888 Allergy status to other drugs, medicaments and biological substances status: Secondary | ICD-10-CM

## 2021-03-08 LAB — CBC WITH DIFFERENTIAL/PLATELET
Abs Immature Granulocytes: 0.05 10*3/uL (ref 0.00–0.07)
Basophils Absolute: 0.1 10*3/uL (ref 0.0–0.1)
Basophils Relative: 0 %
Eosinophils Absolute: 0.1 10*3/uL (ref 0.0–0.5)
Eosinophils Relative: 1 %
HCT: 37.9 % (ref 36.0–46.0)
Hemoglobin: 12 g/dL (ref 12.0–15.0)
Immature Granulocytes: 0 %
Lymphocytes Relative: 8 %
Lymphs Abs: 1 10*3/uL (ref 0.7–4.0)
MCH: 31 pg (ref 26.0–34.0)
MCHC: 31.7 g/dL (ref 30.0–36.0)
MCV: 97.9 fL (ref 80.0–100.0)
Monocytes Absolute: 0.8 10*3/uL (ref 0.1–1.0)
Monocytes Relative: 7 %
Neutro Abs: 10.6 10*3/uL — ABNORMAL HIGH (ref 1.7–7.7)
Neutrophils Relative %: 84 %
Platelets: 195 10*3/uL (ref 150–400)
RBC: 3.87 MIL/uL (ref 3.87–5.11)
RDW: 14.1 % (ref 11.5–15.5)
WBC: 12.7 10*3/uL — ABNORMAL HIGH (ref 4.0–10.5)
nRBC: 0 % (ref 0.0–0.2)

## 2021-03-08 LAB — COMPREHENSIVE METABOLIC PANEL
ALT: 11 U/L (ref 0–44)
AST: 15 U/L (ref 15–41)
Albumin: 3.9 g/dL (ref 3.5–5.0)
Alkaline Phosphatase: 52 U/L (ref 38–126)
Anion gap: 8 (ref 5–15)
BUN: 11 mg/dL (ref 8–23)
CO2: 30 mmol/L (ref 22–32)
Calcium: 9.1 mg/dL (ref 8.9–10.3)
Chloride: 98 mmol/L (ref 98–111)
Creatinine, Ser: 0.66 mg/dL (ref 0.44–1.00)
GFR, Estimated: >60 mL/min (ref >60)
Glucose, Bld: 92 mg/dL (ref 70–99)
Potassium: 3.4 mmol/L — ABNORMAL LOW (ref 3.5–5.1)
Sodium: 136 mmol/L (ref 135–145)
Total Bilirubin: 0.3 mg/dL (ref 0.3–1.2)
Total Protein: 7.1 g/dL (ref 6.5–8.1)

## 2021-03-08 LAB — RESP PANEL BY RT-PCR (FLU A&B, COVID) ARPGX2
Influenza A by PCR: NEGATIVE
Influenza B by PCR: NEGATIVE
SARS Coronavirus 2 by RT PCR: NEGATIVE

## 2021-03-08 LAB — LACTIC ACID, PLASMA: Lactic Acid, Venous: 1.9 mmol/L (ref 0.5–1.9)

## 2021-03-08 MED ORDER — ALBUTEROL SULFATE (2.5 MG/3ML) 0.083% IN NEBU
2.5000 mg | INHALATION_SOLUTION | RESPIRATORY_TRACT | Status: DC | PRN
  Administered 2021-03-11: 2.5 mg via RESPIRATORY_TRACT
  Filled 2021-03-08: qty 3

## 2021-03-08 MED ORDER — SIMVASTATIN 20 MG PO TABS
20.0000 mg | ORAL_TABLET | Freq: Every day | ORAL | Status: DC
  Administered 2021-03-08 – 2021-03-13 (×6): 20 mg via ORAL
  Filled 2021-03-08 (×6): qty 1

## 2021-03-08 MED ORDER — ACETAMINOPHEN 650 MG RE SUPP: 650.0000 mg | Freq: Four times a day (QID) | RECTAL | Status: DC | PRN

## 2021-03-08 MED ORDER — SODIUM CHLORIDE 0.9 % IV SOLN
INTRAVENOUS | Status: DC | PRN
  Administered 2021-03-08: 100 mL via INTRAVENOUS

## 2021-03-08 MED ORDER — ONDANSETRON HCL 4 MG PO TABS: 4.0000 mg | ORAL_TABLET | Freq: Four times a day (QID) | ORAL | Status: DC | PRN

## 2021-03-08 MED ORDER — SODIUM CHLORIDE 0.9 % IV SOLN
2.0000 g | INTRAVENOUS | Status: AC
  Administered 2021-03-08 – 2021-03-14 (×7): 2 g via INTRAVENOUS
  Filled 2021-03-08 (×7): qty 20

## 2021-03-08 MED ORDER — POLYETHYLENE GLYCOL 3350 17 G PO PACK: 17.0000 g | PACK | Freq: Every day | ORAL | Status: DC | PRN

## 2021-03-08 MED ORDER — METOPROLOL SUCCINATE ER 25 MG PO TB24
25.0000 mg | ORAL_TABLET | Freq: Every day | ORAL | Status: DC
  Administered 2021-03-08 – 2021-03-14 (×7): 25 mg via ORAL
  Filled 2021-03-08 (×7): qty 1

## 2021-03-08 MED ORDER — TETANUS-DIPHTH-ACELL PERTUSSIS 5-2.5-18.5 LF-MCG/0.5 IM SUSY
0.5000 mL | PREFILLED_SYRINGE | Freq: Once | INTRAMUSCULAR | Status: AC
  Administered 2021-03-08: 0.5 mL via INTRAMUSCULAR
  Filled 2021-03-08: qty 0.5

## 2021-03-08 MED ORDER — CLINDAMYCIN PHOSPHATE 600 MG/50ML IV SOLN
600.0000 mg | Freq: Once | INTRAVENOUS | Status: AC
  Administered 2021-03-08: 600 mg via INTRAVENOUS
  Filled 2021-03-08: qty 50

## 2021-03-08 MED ORDER — HYDROCODONE-ACETAMINOPHEN 5-325 MG PO TABS
1.0000 | ORAL_TABLET | Freq: Four times a day (QID) | ORAL | Status: DC | PRN
  Administered 2021-03-08 – 2021-03-13 (×15): 1 via ORAL
  Filled 2021-03-08 (×16): qty 1

## 2021-03-08 MED ORDER — SODIUM CHLORIDE 0.9 % IV SOLN
100.0000 mg | Freq: Once | INTRAVENOUS | Status: DC
  Filled 2021-03-08: qty 100

## 2021-03-08 MED ORDER — ALBUTEROL SULFATE HFA 108 (90 BASE) MCG/ACT IN AERS: 2.0000 | INHALATION_SPRAY | RESPIRATORY_TRACT | Status: DC | PRN

## 2021-03-08 MED ORDER — CYCLOBENZAPRINE HCL 10 MG PO TABS
10.0000 mg | ORAL_TABLET | Freq: Every day | ORAL | Status: DC
  Administered 2021-03-08 – 2021-03-14 (×7): 10 mg via ORAL
  Filled 2021-03-08 (×7): qty 1

## 2021-03-08 MED ORDER — FENTANYL CITRATE (PF) 100 MCG/2ML IJ SOLN
50.0000 ug | Freq: Once | INTRAMUSCULAR | Status: AC
  Administered 2021-03-08: 50 ug via INTRAVENOUS
  Filled 2021-03-08: qty 2

## 2021-03-08 MED ORDER — ESCITALOPRAM OXALATE 10 MG PO TABS
20.0000 mg | ORAL_TABLET | Freq: Every day | ORAL | Status: DC
Start: 2021-03-08 — End: 2021-03-15
  Administered 2021-03-08 – 2021-03-15 (×7): 20 mg via ORAL
  Filled 2021-03-08 (×8): qty 2

## 2021-03-08 MED ORDER — POTASSIUM CHLORIDE IN NACL 20-0.45 MEQ/L-% IV SOLN
INTRAVENOUS | Status: AC
  Filled 2021-03-08 (×2): qty 1000

## 2021-03-08 MED ORDER — CLINDAMYCIN PHOSPHATE 600 MG/50ML IV SOLN
600.0000 mg | Freq: Three times a day (TID) | INTRAVENOUS | Status: DC
  Administered 2021-03-09 – 2021-03-12 (×11): 600 mg via INTRAVENOUS
  Filled 2021-03-08 (×11): qty 50

## 2021-03-08 MED ORDER — FENTANYL CITRATE (PF) 100 MCG/2ML IJ SOLN
50.0000 ug | INTRAMUSCULAR | Status: DC | PRN
  Administered 2021-03-10 – 2021-03-12 (×2): 50 ug via INTRAVENOUS
  Filled 2021-03-08 (×2): qty 2

## 2021-03-08 MED ORDER — IPRATROPIUM-ALBUTEROL 0.5-2.5 (3) MG/3ML IN SOLN
3.0000 mL | Freq: Once | RESPIRATORY_TRACT | Status: AC
  Administered 2021-03-08: 3 mL via RESPIRATORY_TRACT
  Filled 2021-03-08: qty 3

## 2021-03-08 MED ORDER — ONDANSETRON HCL 4 MG/2ML IJ SOLN: 4.0000 mg | Freq: Four times a day (QID) | INTRAMUSCULAR | Status: DC | PRN

## 2021-03-08 MED ORDER — ACETAMINOPHEN 325 MG PO TABS
650.0000 mg | ORAL_TABLET | Freq: Four times a day (QID) | ORAL | Status: DC | PRN
  Administered 2021-03-09 – 2021-03-12 (×4): 650 mg via ORAL
  Filled 2021-03-08 (×4): qty 2

## 2021-03-08 MED ORDER — MELATONIN 5 MG PO TABS
10.0000 mg | ORAL_TABLET | Freq: Every day | ORAL | Status: DC
  Administered 2021-03-08 – 2021-03-14 (×7): 10 mg via ORAL
  Filled 2021-03-08 (×7): qty 2

## 2021-03-08 NOTE — Progress Notes (Signed)
Orthopedic Tech Progress Note Patient Details:  Marie Lyons August 23, 1950 [id number]  Ortho Devices Type of Ortho Device: Wrist splint Ortho Device/Splint Location: Left Wrist Ortho Device/Splint Interventions: Application   Post Interventions Patient Tolerated: Well   Linus Salmons Shamere Dilworth 03/08/2021, 11:08 PM

## 2021-03-08 NOTE — ED Notes (Signed)
Dog bite to Left foream/wrist/hand, swelling/redness/tenderness from hand to elbow.

## 2021-03-08 NOTE — ED Notes (Addendum)
Blood cultures collected at 1620 in pt's left AC (pt's 2ND set).

## 2021-03-08 NOTE — Plan of Care (Signed)
[facility] Plan of Care Note  Patient: Cicely Ortner    ZRA:[id number]  PCP: Maury Dus, MD    DOB: 03-Oct-1950  DOS: 03/08/2021   Received a phone call from [facility], regarding transfer of Ms. Doria Fern. Requesting: EDP Dr. Rex Kras Reason for transfer: Admission for cellulitis and concern for abscess, need for IV antibiotics and hand surgery consultation History: Bitten by a dog on left forearm/hand on Monday.  Progressively worsening redness with swelling and pain with range of motion.  Per Dr. Rex Kras patient has significant swelling around her carpal tunnel area and unable to perform any ROM.  Concern for deep-seated abscess. Work up: X-ray unremarkable for any acute abnormality.  Mild leukocytosis.  No fever.  Intermittently hypoxic.  Bilateral expiratory wheezing on exam. Treatment received: IV clindamycin due to allergies.  Duo nebs for wheezing.  EDP discussed with Dr. Apolonio Schneiders.  Dr. Apolonio Schneiders is requesting hospitalist to call for new consult on arrival to [place] of care: The patient will be accepted for admission to [place] unit, [facility],. Requested the physician to get some photographs of the affected extremity  Author: Berle Mull, MD [facility] 03/08/2021  If 7PM-7AM, please contact night-coverage at [email or web address],

## 2021-03-08 NOTE — H&P (Signed)
History and Physical    Marie Lyons WPY:[id number] DOB: [birth year] DOA: 03/08/2021  PCP: Maury Dus, MD   Patient coming from: Home   Chief Complaint: Dog bite with left wrist and forearm redness, swelling, pain   HPI: Marie Lyons is a 71 y.o. female with medical history significant for COPD, depression, history of CVA, chronic back pain, and hypertension, now presenting to the emergency department for evaluation of pain, redness, and swelling involving the left wrist and forearm after a dog bite on 03/06/2021.  Patient reports that she was in her usual state the night of 03/06/2021 when she was trying to separate 2 dogs that were fighting and suffered multiple bites to the left hand, wrist, and forearm.  She had some immediate mild pain and bleeding, but symptoms subsided until the following day when she began to develop some redness, swelling, and increasing pain surrounding the punctures.  Today, there was progressive worsening and erythema extending to the elbow.  Pain is most severe at the ulnar aspect of her left wrist on the palmar side.  There has not been any drainage.  She felt some chills today but has not taken her temperature.  She denies any lightheadedness.  She reports chronic cough and wheezing, but denies any acute worsening in this.  Patient reported that the dog that bit her was healthy and vaccinated.  Patient has a history of surgery on the left hand following an injury approximately 6 years ago that involved the first and second MCP joints and distal fifth phalanx.  [facility] ED Course: Upon arrival to the ED, patient is found to be afebrile, saturating well on room air, and with stable blood pressure.  Chemistry panel features a potassium 3.4 and CBC notable for leukocytosis to 12,700.  Lactic acid was reassuringly normal.  COVID-19 screening test was negative.  Chest x-ray with COPD changes and mild bibasilar scarring or atelectasis.  Radiographs of the left hand,  wrist, and forearm reveal chronic degenerative and stable postoperative changes without acute osseous abnormality.  Hand surgery was consulted by the ED physician, blood cultures were collected, and the patient was treated with Tdap, DuoNeb, fentanyl, and clindamycin.  She was transferred to [facility] for admission.  Review of Systems:  All other systems reviewed and apart from HPI, are negative.  Past Medical History:  Diagnosis Date  . Cardiac arrhythmia   . Chronic back pain   . High cholesterol   . Migraine   . Scoliosis   . Stroke [facility])     Past Surgical History:  Procedure Laterality Date  . ABDOMINAL HYSTERECTOMY    . BUNIONECTOMY    . ROTATOR CUFF REPAIR      Social History:   reports that she has been smoking cigarettes. She has a 12.50 pack-year smoking history. She has never used smokeless tobacco. She reports that she does not drink alcohol and does not use drugs.  Allergies  Allergen Reactions  . Ciprofloxacin Shortness Of Breath    Denies this allergy on 09/23/16 (??)  . Keflex [Cephalexin] Anaphylaxis  . Penicillins Anaphylaxis    Has patient had a PCN reaction causing immediate rash, facial/tongue/throat swelling, SOB or lightheadedness with hypotension: Yes Has patient had a PCN reaction causing severe rash involving mucus membranes or skin necrosis: Yes Has patient had a PCN reaction that required hospitalization Yes Has patient had a PCN reaction occurring within the last 10 years: No If all of the above answers are "NO", then may  proceed with Cephalosporin use.   . Aspirin Other (See Comments)    Aggravates ulcer  . Darvon [Propoxyphene Hcl] Other (See Comments)    Wheezing  . Ibuprofen Other (See Comments)    Wheezing  . Imitrex [Sumatriptan] Other (See Comments)    Contraindicated because pt had a stroke  . Meperidine Other (See Comments)    Unknown reaction (per patient)  . Monosodium Glutamate Other (See Comments)    Migraine   .  Nitrates, Organic Other (See Comments)    Migraine   . Nsaids Other (See Comments)    Aggravates ulcer  . Sulfa Antibiotics Other (See Comments)    Unknown reaction (per patient)    Family History  Problem Relation Age of Onset  . CAD Father   . Stroke Neg Hx      Prior to Admission medications   Medication Sig Start Date End Date Taking? Authorizing Provider  acetaminophen (TYLENOL) 500 MG tablet Take 500 mg by mouth every 6 (six) hours as needed for headache (or pain).    Yes [provider]  ARTIFICIAL TEAR OP Place 1 drop into both eyes daily.   Yes [provider]  calcium elemental as carbonate (BARIATRIC TUMS ULTRA) 400 MG chewable tablet Chew 1,000 mg by mouth daily as needed for heartburn.   Yes [provider]  cholecalciferol (VITAMIN D) 1000 UNITS tablet Take 1,000 Units by mouth daily.    Yes [provider]  cyclobenzaprine (FLEXERIL) 10 MG tablet Take 10 mg by mouth at bedtime. 11/14/17  Yes [provider]  diphenhydrAMINE (BENADRYL) 25 MG tablet Take 1 tablet (25 mg total) by mouth every 6 (six) hours as needed. Patient taking differently: Take 25 mg by mouth every 6 (six) hours as needed for allergies. 12/16/20  Yes Aberman, Druscilla Brownie, PA-C  EPINEPHrine 0.3 mg/0.3 mL IJ SOAJ injection Inject 0.3 mg into the muscle as needed for anaphylaxis. 12/16/20  Yes Aberman, Caroline C, PA-C  escitalopram (LEXAPRO) 20 MG tablet Take 20 mg by mouth daily. 11/14/17  Yes [provider]  estradiol (VIVELLE-DOT) 0.1 MG/24HR patch Place 1 patch onto the skin once a week.   Yes [provider]  fluticasone (FLONASE) 50 MCG/ACT nasal spray Place 2 sprays into the nose daily. 07/25/13  Yes Roselee Culver, MD  HYDROcodone-acetaminophen (NORCO/VICODIN) 5-325 MG per tablet Take 1 tablet by mouth every 6 (six) hours as needed for moderate pain. 09/08/14  Yes Lawyer, Harrell Gave, PA-C  Melatonin 10 MG TABS Take 1 tablet by mouth  at bedtime.   Yes [provider]  metoprolol succinate (TOPROL-XL) 25 MG 24 hr tablet Take 25 mg by mouth at bedtime.   Yes [provider]  simvastatin (ZOCOR) 20 MG tablet Take 20 mg by mouth at bedtime.   Yes [provider]  VENTOLIN HFA 108 (90 Base) MCG/ACT inhaler Inhale 2 puffs into the lungs every 4 (four) hours as needed for wheezing or shortness of breath. 10/17/17  Yes [provider]  vitamin C (ASCORBIC ACID) 250 MG tablet Take 250 mg by mouth daily.   Yes [provider]    Physical Exam: Vitals:   03/08/21 1900 03/08/21 1915 03/08/21 1953 03/08/21 2035  BP: (!) 144/80  129/80 123/87  Pulse: 95  96 (!) 101  Resp:   18 17  Temp:   98.4 F (36.9 C) 99.4 F (37.4 C)  TempSrc:   Oral Oral  SpO2:  96% 95% 90%  Weight:  Height:        Constitutional: NAD, calm  Eyes: PERTLA, lids and conjunctivae normal ENMT: Mucous membranes are moist. Posterior pharynx clear of any exudate or lesions.   Neck: normal, supple, no masses, no thyromegaly Respiratory: Diminished bilaterally, no wheezing, no crackles. No accessory muscle use.  Cardiovascular: S1 & S2 heard, regular rate and rhythm. No lower extremity edema.  Abdomen: No distension, no tenderness, soft. Bowel sounds active.  Musculoskeletal: no clubbing / cyanosis. No joint deformity upper and lower extremities.   Skin: Erythema, heat, tenderness, and edema surrounding distal LUE puncture wounds with small area of fluctuance at ulnar wrist on palmar side. Warm, dry, well-perfused. Neurologic: No facial asymmetry, gross hearing deficit. Sensation intact. Moving all extremities.  Psychiatric: Alert and oriented to person, place, and situation. Pleasant and cooperative.    Labs and Imaging on Admission: I have personally reviewed following labs and imaging studies  CBC: Recent Labs  Lab 03/08/21 1528  WBC 12.7*  NEUTROABS 10.6*  HGB 12.0  HCT 37.9  MCV 97.9  PLT 970    Basic Metabolic Panel: Recent Labs  Lab 03/08/21 1528  NA 136  K 3.4*  CL 98  CO2 30  GLUCOSE 92  BUN 11  CREATININE 0.66  CALCIUM 9.1   GFR: Estimated Creatinine Clearance: 46.5 mL/min (by C-G formula based on SCr of 0.66 mg/dL). Liver Function Tests: Recent Labs  Lab 03/08/21 1528  AST 15  ALT 11  ALKPHOS 52  BILITOT 0.3  PROT 7.1  ALBUMIN 3.9   No results for input(s): LIPASE, AMYLASE in the last 168 hours. No results for input(s): AMMONIA in the last 168 hours. Coagulation Profile: No results for input(s): INR, PROTIME in the last 168 hours. Cardiac Enzymes: No results for input(s): CKTOTAL, CKMB, CKMBINDEX, TROPONINI in the last 168 hours. BNP (last 3 results) No results for input(s): PROBNP in the last 8760 hours. HbA1C: No results for input(s): HGBA1C in the last 72 hours. CBG: No results for input(s): GLUCAP in the last 168 hours. Lipid Profile: No results for input(s): CHOL, HDL, LDLCALC, TRIG, CHOLHDL, LDLDIRECT in the last 72 hours. Thyroid Function Tests: No results for input(s): TSH, T4TOTAL, FREET4, T3FREE, THYROIDAB in the last 72 hours. Anemia Panel: No results for input(s): VITAMINB12, FOLATE, FERRITIN, TIBC, IRON, RETICCTPCT in the last 72 hours. Urine analysis:    Component Value Date/Time   COLORURINE COLORLESS (A) 12/16/2020 1814   APPEARANCEUR CLEAR 12/16/2020 1814   LABSPEC 1.003 (L) 12/16/2020 1814   PHURINE 7.0 12/16/2020 1814   GLUCOSEU NEGATIVE 12/16/2020 1814   HGBUR NEGATIVE 12/16/2020 1814   BILIRUBINUR NEGATIVE 12/16/2020 1814   KETONESUR NEGATIVE 12/16/2020 1814   PROTEINUR NEGATIVE 12/16/2020 1814   UROBILINOGEN 1.0 09/09/2012 2015   NITRITE NEGATIVE 12/16/2020 1814   LEUKOCYTESUR NEGATIVE 12/16/2020 1814   Sepsis Labs: @LABRCNTIP (procalcitonin:4,lacticidven:4) ) Recent Results (from the past 240 hour(s))  Culture, blood (routine x 2)     Status: None (Preliminary result)   Collection Time: 03/08/21  3:28 PM    Specimen: BLOOD RIGHT FOREARM  Result Value Ref Range Status   Specimen Description BLOOD RIGHT FOREARM  Final   Special Requests   Final    BOTTLES DRAWN AEROBIC AND ANAEROBIC Blood Culture adequate volume Performed at [facility], [place] [address] [place], [place] [address]    Culture PENDING  Incomplete   Report Status PENDING  Incomplete  Culture, blood (routine x 2)     Status: None (Preliminary result)  Collection Time: 03/08/21  3:31 PM   Specimen: BLOOD  Result Value Ref Range Status   Specimen Description BLOOD LEFT ANTECUBITAL  Final   Special Requests   Final    BOTTLES DRAWN AEROBIC AND ANAEROBIC Blood Culture adequate volume Performed at [facility], [place] [address], [place], [place] [address]    Culture PENDING  Incomplete   Report Status PENDING  Incomplete  Resp Panel by RT-PCR (Flu A&B, Covid) Nasopharyngeal Swab     Status: None   Collection Time: 03/08/21  4:42 PM   Specimen: Nasopharyngeal Swab; Nasopharyngeal(NP) swabs in vial transport medium  Result Value Ref Range Status   SARS Coronavirus 2 by RT PCR NEGATIVE NEGATIVE Final    Comment: (NOTE) SARS-CoV-2 target nucleic acids are NOT DETECTED.  The SARS-CoV-2 RNA is generally detectable in upper respiratory specimens during the acute phase of infection. The lowest concentration of SARS-CoV-2 viral copies this assay can detect is 138 copies/mL. A negative result does not preclude SARS-Cov-2 infection and should not be used as the sole basis for treatment or other patient management decisions. A negative result may occur with  improper specimen collection/handling, submission of specimen other than nasopharyngeal swab, presence of viral mutation(s) within the areas targeted by this assay, and inadequate number of viral copies(<138 copies/mL). A negative result must be combined with clinical observations, patient history, and epidemiological information. The expected result is Negative.  Fact  Sheet for Patients:  EntrepreneurPulse.com.au  Fact Sheet for Healthcare Providers:  IncredibleEmployment.be  This test is no t yet approved or cleared by the [place] FDA and  has been authorized for detection and/or diagnosis of SARS-CoV-2 by FDA under an Emergency Use Authorization (EUA). This EUA will remain  in effect (meaning this test can be used) for the duration of the COVID-19 declaration under Section 564(b)(1) of the Act, 21 U.S.C.section 360bbb-3(b)(1), unless the authorization is terminated  or revoked sooner.       Influenza A by PCR NEGATIVE NEGATIVE Final   Influenza B by PCR NEGATIVE NEGATIVE Final    Comment: (NOTE) The Xpert Xpress SARS-CoV-2/FLU/RSV plus assay is intended as an aid in the diagnosis of influenza from Nasopharyngeal swab specimens and should not be used as a sole basis for treatment. Nasal washings and aspirates are unacceptable for Xpert Xpress SARS-CoV-2/FLU/RSV testing.  Fact Sheet for Patients: EntrepreneurPulse.com.au  Fact Sheet for Healthcare Providers: IncredibleEmployment.be  This test is not yet approved or cleared by the [place] FDA and has been authorized for detection and/or diagnosis of SARS-CoV-2 by FDA under an Emergency Use Authorization (EUA). This EUA will remain in effect (meaning this test can be used) for the duration of the COVID-19 declaration under Section 564(b)(1) of the Act, 21 U.S.C. section 360bbb-3(b)(1), unless the authorization is terminated or revoked.       Radiological Exams on Admission: DG Chest 2 View  Result Date: 03/08/2021 CLINICAL DATA:  Wheezing and cough. EXAM: CHEST - 2 VIEW COMPARISON:  November 18, 2020 FINDINGS: The lungs are hyperinflated. Mild, diffuse, chronic appearing increased interstitial lung markings are seen. Mild areas of scarring and/or atelectasis are seen within the bilateral lung bases. This is  decreased in severity when compared to the prior study. There is no evidence of a pleural effusion or pneumothorax. The heart size and mediastinal contours are within normal limits. Moderate to marked severity S-shape scoliosis of the thoracic spine is seen. IMPRESSION: COPD with mild bibasilar scarring and/or atelectasis. Electronically Signed  By: Virgina Norfolk M.D.   On: 03/08/2021 16:14   DG Wrist Complete Left  Result Date: 03/08/2021 CLINICAL DATA:  Status post dog bite to the left forearm/hand. EXAM: LEFT WRIST - COMPLETE 3+ VIEW COMPARISON:  None. FINDINGS: There is no evidence of an acute fracture or dislocation. Moderate to marked severity degenerative changes an adjacent soft tissue calcification are seen along the radiocarpal joint and carpometacarpal articulation of the left thumb. Mild diffuse soft tissue swelling is seen. IMPRESSION: Chronic and degenerative changes without evidence of acute fracture. Electronically Signed   By: Virgina Norfolk M.D.   On: 03/08/2021 16:20   DG Hand Complete Left  Result Date: 03/08/2021 CLINICAL DATA:  Status post dog bite to the left forearm/hand. EXAM: LEFT HAND - COMPLETE 3+ VIEW COMPARISON:  February 27, 2018 FINDINGS: There is no evidence of acute fracture or dislocation. Stable chronic and postoperative changes are seen involving the distal aspects of the second and third left metacarpals and adjacent MCP joints. A radiopaque surgical screw is again seen overlying the middle and distal phalanges of the fifth left finger. Degenerative changes are seen involving numerous interphalangeal joints, the left wrist and the carpometacarpal articulation of the left thumb. Soft tissue swelling is seen adjacent to the first metacarpophalangeal articulation. IMPRESSION: Stable chronic and postoperative changes without evidence of acute osseous abnormality. Electronically Signed   By: Virgina Norfolk M.D.   On: 03/08/2021 16:18    Assessment/Plan   1.  Infected dog bite, left wrist  - She is afebrile, hemodynamically stable, has normal lactate; no crepitus; neurovascularly intact   - Blood cultures were collected in ED and she was started on clindamycin  - Discussed antibiotic coverage with pharmacy in light of multiple listed allergies, appreciate their assistance  - Dr. Caralyn Guile of hand surgery consulting and much appreciated  - Continue antibiotics with IV clindamycin and Rocephin, splint wrist, keep NPO after midnight, follow cultures and clinical course    2. COPD  - Stable, continue as-needed albuterol    3. Hypertension  - BP at goal, continue metoprolol    4. History of CVA  - Continue statin, she reports aspirin intolerance    5. Depression  - Continue Lexapro     DVT prophylaxis: SCDs  Code Status: Full  Level of Care: Level of care: Med-Surg Family Communication: None present  Disposition Plan:  Patient is from: Home  Anticipated d/c is to: Home  Anticipated d/c date is: ~03/11/21 Patient currently: Pending improvement with antibiotics and/or surgery  Consults called: Hand surgery, Dr. Caralyn Guile  Admission status: Inpatient     Vianne Bulls, MD [facility]  03/08/2021, 9:28 PM

## 2021-03-08 NOTE — ED Notes (Signed)
Repositioned pulse ox until good pleth obtaine. 96%. Pt stated that she felt fine.

## 2021-03-08 NOTE — ED Triage Notes (Addendum)
Patient was bitten on the left forearm/hand by a dog on Monday night while visiting a friend in [place], left forearm with redness/tenderness/swelling from hand to elbow.  Dog's rabies vaccination status is current.

## 2021-03-08 NOTE — Plan of Care (Signed)

## 2021-03-08 NOTE — ED Provider Notes (Signed)
[facility] EMERGENCY DEPT Provider Note   CSN: [id number] Arrival date & time: 03/08/21  1445     History Chief Complaint  Patient presents with  . Animal Bite    Marie Lyons is a 71 y.o. female.  71 year old female with past medical history below including COPD, CVA, hypertension, hyperlipidemia, migraines who presents with dog bite to left hand.  2 days ago, patient was visiting a friend in [place] when a dog that was a family pet bit her on the hand and wrist.  She sustained multiple puncture wounds.  Yesterday, she began having some swelling and redness around the area.  She cleaned the area and bandaged it.  Today she has developed worsening swelling and redness moving up her forearm towards her elbow.  Pain has also increased.  She has felt cold today but has had no measured fevers.  Of note, she has a history of COPD and uses inhalers at home.  Over the past week, she has had mild increase in wheezing and some cough with yellow/green sputum.  She denies any associated sore throat, runny nose, fevers, or sick contacts.  Dog was up-to-date on rabies vaccination.  Patient thinks her last tetanus vaccination was in 2013.   Animal Bite      Past Medical History:  Diagnosis Date  . Cardiac arrhythmia   . Chronic back pain   . High cholesterol   . Migraine   . Scoliosis   . Stroke [facility])     Patient Active Problem List   Diagnosis Date Noted  . Hand abscess 03/08/2021  . Asthma 03/01/2021  . Depression 03/01/2021  . Major depressive disorder, recurrent episode, severe ([place]) 01/05/2018  . COPD exacerbation ([place])   . Influenza with pneumonia   . Community acquired pneumonia of right middle lobe of lung 02/12/2017  . Acute respiratory failure with hypoxia ([place]) 02/12/2017  . Tobacco user 02/12/2017  . Essential hypertension 02/12/2017  . HLD (hyperlipidemia) 02/12/2017  . Primary osteoarthritis of left hand 01/26/2016  . Decreased range of motion of  finger 11/30/2015  . Joint pain in fingers of left hand 11/28/2015    Past Surgical History:  Procedure Laterality Date  . ABDOMINAL HYSTERECTOMY    . BUNIONECTOMY    . ROTATOR CUFF REPAIR       OB History   No obstetric history on file.     Family History  Problem Relation Age of Onset  . CAD Father   . Stroke Neg Hx     Social History   Tobacco Use  . Smoking status: Current Every Day Smoker    Packs/day: 0.50    Years: 25.00    Pack years: 12.50    Types: Cigarettes  . Smokeless tobacco: Never Used  Substance Use Topics  . Alcohol use: No  . Drug use: No    Home Medications Prior to Admission medications   Medication Sig Start Date End Date Taking? Authorizing Provider  acetaminophen (TYLENOL) 500 MG tablet Take 500 mg by mouth every 6 (six) hours as needed for headache (or pain).     [provider]  aspirin 81 MG tablet Take 81 mg by mouth daily.    [provider]  calcium elemental as carbonate (TUMS ULTRA 1000) 400 MG tablet Chew 400-800 mg by mouth daily as needed for heartburn.     [provider]  cholecalciferol (VITAMIN D) 1000 UNITS tablet Take 1,000 Units by mouth daily.     [provider]  cyclobenzaprine (FLEXERIL) 10 MG tablet Take 5-10 mg by mouth at bedtime as needed for muscle spasms. 11/14/17   [provider]  diphenhydrAMINE (BENADRYL) 25 MG tablet Take 1 tablet (25 mg total) by mouth every 6 (six) hours as needed. 12/16/20   Suzy Bouchard, PA-C  EPINEPHrine 0.3 mg/0.3 mL IJ SOAJ injection Inject 0.3 mg into the muscle as needed for anaphylaxis. 12/16/20   Suzy Bouchard, PA-C  escitalopram (LEXAPRO) 20 MG tablet Take 20 mg by mouth daily. 11/14/17   [provider]  estradiol (VIVELLE-DOT) 0.1 MG/24HR patch Place 1 patch onto the skin every Wednesday.    [provider]  famotidine (PEPCID) 20 MG tablet Take 1 tablet (20 mg total) by mouth daily. 12/16/20   Suzy Bouchard, PA-C  fluticasone (FLONASE) 50 MCG/ACT nasal spray Place 2 sprays into the nose daily. Patient not taking: Reported on 02/12/2017 07/25/13   Roselee Culver, MD  HYDROcodone-acetaminophen (NORCO/VICODIN) 5-325 MG per tablet Take 1 tablet by mouth every 6 (six) hours as needed for moderate pain. 09/08/14   Lawyer, Harrell Gave, PA-C  MELATONIN PO Take 1 tablet by mouth at bedtime.    [provider]  methocarbamol (ROBAXIN) 500 MG tablet Take 1 tablet (500 mg total) by mouth 4 (four) times daily. Patient not taking: Reported on 01/04/2018 02/15/17   Reyne Dumas, MD  metoprolol succinate (TOPROL-XL) 25 MG 24 hr tablet Take 25 mg by mouth at bedtime.    [provider]  ondansetron (ZOFRAN ODT) 4 MG disintegrating tablet Take 1 tablet (4 mg total) by mouth every 8 (eight) hours as needed for nausea or vomiting. 11/19/20   Lucrezia Starch, MD  simvastatin (ZOCOR) 20 MG tablet Take 20 mg by mouth at bedtime.    [provider]  VENTOLIN HFA 108 (90 Base) MCG/ACT inhaler Inhale 2 puffs into the lungs every 4 (four) hours as needed for wheezing or shortness of breath. 10/17/17   [provider]  vitamin C (ASCORBIC ACID) 250 MG tablet Take 250 mg by mouth daily.    [provider]    Allergies    Ciprofloxacin; Keflex [cephalexin]; Penicillins; Aspirin; Darvon [propoxyphene hcl]; Ibuprofen; Imitrex [sumatriptan]; Meperidine; Monosodium glutamate; Nitrates, organic; Nsaids; and Sulfa antibiotics  Review of Systems   Review of Systems All other systems reviewed and are negative except that which was mentioned in HPI  Physical Exam Updated Vital Signs BP (!) 162/92   Pulse 94   Temp 98.1 F (36.7 C) (Oral)   Resp 18   Ht 5\' 3"  (1.6 m)   Wt 46.3 kg   SpO2 95%   BMI 18.07 kg/m   Physical Exam Constitutional:      General: She is not in acute distress.    Appearance: Normal appearance.  HENT:     Head: Normocephalic and atraumatic.   Eyes:     Conjunctiva/sclera: Conjunctivae normal.  Cardiovascular:     Rate and Rhythm: Normal rate and regular rhythm.     Heart sounds: Normal heart sounds. No murmur heard.   Pulmonary:     Effort: Pulmonary effort is normal.     Comments: Inspiratory and expiratory wheezes b/l, normal WOB, no distress Abdominal:     General: Abdomen is flat. Bowel sounds are normal. There is no distension.     Palpations: Abdomen is soft.     Tenderness: There is no abdominal tenderness.  Musculoskeletal:        General:  Swelling and signs of injury present.     Right lower leg: No edema.     Left lower leg: No edema.     Comments: Edema of palmar hand w/ edema extending onto wrist and distal forearm  Skin:    General: Skin is warm and dry.     Findings: Erythema present.     Comments: Multiple puncture wounds on palmar hand, volar forearm, and a few on dorsal hand w/ mild bloody drainage, significant erythema in these areas w/ erythema extending proximally towards elbow  Neurological:     Mental Status: She is alert and oriented to person, place, and time.     Comments: fluent  Psychiatric:        Mood and Affect: Mood normal.        Behavior: Behavior normal.         ED Results / Procedures / Treatments   Labs (all labs ordered are listed, but only abnormal results are displayed) Labs Reviewed  COMPREHENSIVE METABOLIC PANEL - Abnormal; Notable for the following components:      Result Value   Potassium 3.4 (*)    All other components within normal limits  CBC WITH DIFFERENTIAL/PLATELET - Abnormal; Notable for the following components:   WBC 12.7 (*)    Neutro Abs 10.6 (*)    All other components within normal limits  CULTURE, BLOOD (ROUTINE X 2)  CULTURE, BLOOD (ROUTINE X 2)  RESP PANEL BY RT-PCR (FLU A&B, COVID) ARPGX2  LACTIC ACID, PLASMA    EKG None  Radiology DG Chest 2 View  Result Date: 03/08/2021 CLINICAL DATA:  Wheezing and cough. EXAM: CHEST - 2 VIEW  COMPARISON:  November 18, 2020 FINDINGS: The lungs are hyperinflated. Mild, diffuse, chronic appearing increased interstitial lung markings are seen. Mild areas of scarring and/or atelectasis are seen within the bilateral lung bases. This is decreased in severity when compared to the prior study. There is no evidence of a pleural effusion or pneumothorax. The heart size and mediastinal contours are within normal limits. Moderate to marked severity S-shape scoliosis of the thoracic spine is seen. IMPRESSION: COPD with mild bibasilar scarring and/or atelectasis. Electronically Signed   By: Virgina Norfolk M.D.   On: 03/08/2021 16:14   DG Wrist Complete Left  Result Date: 03/08/2021 CLINICAL DATA:  Status post dog bite to the left forearm/hand. EXAM: LEFT WRIST - COMPLETE 3+ VIEW COMPARISON:  None. FINDINGS: There is no evidence of an acute fracture or dislocation. Moderate to marked severity degenerative changes an adjacent soft tissue calcification are seen along the radiocarpal joint and carpometacarpal articulation of the left thumb. Mild diffuse soft tissue swelling is seen. IMPRESSION: Chronic and degenerative changes without evidence of acute fracture. Electronically Signed   By: Virgina Norfolk M.D.   On: 03/08/2021 16:20   DG Hand Complete Left  Result Date: 03/08/2021 CLINICAL DATA:  Status post dog bite to the left forearm/hand. EXAM: LEFT HAND - COMPLETE 3+ VIEW COMPARISON:  February 27, 2018 FINDINGS: There is no evidence of acute fracture or dislocation. Stable chronic and postoperative changes are seen involving the distal aspects of the second and third left metacarpals and adjacent MCP joints. A radiopaque surgical screw is again seen overlying the middle and distal phalanges of the fifth left finger. Degenerative changes are seen involving numerous interphalangeal joints, the left wrist and the carpometacarpal articulation of the left thumb. Soft tissue swelling is seen adjacent to the first  metacarpophalangeal articulation. IMPRESSION: Stable chronic and  postoperative changes without evidence of acute osseous abnormality. Electronically Signed   By: Virgina Norfolk M.D.   On: 03/08/2021 16:18    Procedures Procedures   Medications Ordered in ED Medications  doxycycline (VIBRAMYCIN) 100 mg in sodium chloride 0.9 % 250 mL IVPB (has no administration in time range)  0.9 %  sodium chloride infusion (100 mLs Intravenous New Bag/Given 03/08/21 1725)  ipratropium-albuterol (DUONEB) 0.5-2.5 (3) MG/3ML nebulizer solution 3 mL (3 mLs Nebulization Given 03/08/21 1635)  Tdap (BOOSTRIX) injection 0.5 mL (0.5 mLs Intramuscular Given 03/08/21 1638)  fentaNYL (SUBLIMAZE) injection 50 mcg (50 mcg Intravenous Given 03/08/21 1635)  clindamycin (CLEOCIN) IVPB 600 mg (600 mg Intravenous New Bag/Given 03/08/21 1726)    ED Course  I have reviewed the triage vital signs and the nursing notes.  Pertinent labs & imaging results that were available during my care of the patient were reviewed by me and considered in my medical decision making (see chart for details).    MDM Rules/Calculators/A&P                          Pt w/ significant redness and swelling on exam concerning for deep space infection and spreading cellulitis from dog bite. VS reassuring against sepsis. Labs show WBC 12.7, normal lactate. No acute findings on XR of wrist and hand. She did have wheezing on exam however was not complaining of respiratory problems. CXR with chronic COPD changes. Will treat supportively w/ albuterol for now and hold off on steroids given her current infection. Updated tdap. Initially the pharmacist recommended doxycycline based on allergy list but we don't have it in stock IV here, thus gave IV clindamycin.  Discussed w/ hand surgery, Dr. Caralyn Guile. He can see pt in consultation once she arrives at [facility] hospitalist can call him once she's there. Discussed admission for IV abx and COPD management w/ [facility], Dr. Posey Pronto.  Pt will be transferred to [facility] for admission.  Final Clinical Impression(s) / ED Diagnoses Final diagnoses:  Dog bite of left wrist with infection, initial encounter  Chronic obstructive pulmonary disease, unspecified COPD type [facility])    Rx / DC Orders ED Discharge Orders    None       Hillari Zumwalt, Wenda Overland, MD 03/08/21 1845

## 2021-03-09 ENCOUNTER — Encounter (HOSPITAL_COMMUNITY): Payer: Self-pay | Admitting: Family Medicine

## 2021-03-09 ENCOUNTER — Observation Stay (HOSPITAL_COMMUNITY): Payer: Medicare HMO | Admitting: Anesthesiology

## 2021-03-09 ENCOUNTER — Encounter (HOSPITAL_COMMUNITY)
Admission: EM | Disposition: A | Discharge status: Home Health | Payer: Self-pay | Source: Home / Self Care | Attending: Student

## 2021-03-09 DIAGNOSIS — M65042 Abscess of tendon sheath, left hand: Secondary | ICD-10-CM | POA: Diagnosis not present

## 2021-03-09 DIAGNOSIS — S61452D Open bite of left hand, subsequent encounter: Secondary | ICD-10-CM

## 2021-03-09 DIAGNOSIS — L089 Local infection of the skin and subcutaneous tissue, unspecified: Secondary | ICD-10-CM | POA: Diagnosis not present

## 2021-03-09 DIAGNOSIS — Z8673 Personal history of transient ischemic attack (TIA), and cerebral infarction without residual deficits: Secondary | ICD-10-CM | POA: Diagnosis not present

## 2021-03-09 DIAGNOSIS — M71032 Abscess of bursa, left wrist: Secondary | ICD-10-CM | POA: Diagnosis not present

## 2021-03-09 DIAGNOSIS — M65032 Abscess of tendon sheath, left forearm: Secondary | ICD-10-CM | POA: Diagnosis not present

## 2021-03-09 DIAGNOSIS — I10 Essential (primary) hypertension: Secondary | ICD-10-CM | POA: Diagnosis not present

## 2021-03-09 HISTORY — PX: I & D EXTREMITY: SHX5045

## 2021-03-09 LAB — CBC WITH DIFFERENTIAL/PLATELET
Abs Immature Granulocytes: 0.06 10*3/uL (ref 0.00–0.07)
Basophils Absolute: 0 10*3/uL (ref 0.0–0.1)
Basophils Relative: 0 %
Eosinophils Absolute: 0.1 10*3/uL (ref 0.0–0.5)
Eosinophils Relative: 1 %
HCT: 34.2 % — ABNORMAL LOW (ref 36.0–46.0)
Hemoglobin: 10.9 g/dL — ABNORMAL LOW (ref 12.0–15.0)
Immature Granulocytes: 1 %
Lymphocytes Relative: 11 %
Lymphs Abs: 1.3 10*3/uL (ref 0.7–4.0)
MCH: 30.8 pg (ref 26.0–34.0)
MCHC: 31.9 g/dL (ref 30.0–36.0)
MCV: 96.6 fL (ref 80.0–100.0)
Monocytes Absolute: 0.9 10*3/uL (ref 0.1–1.0)
Monocytes Relative: 8 %
Neutro Abs: 9.5 10*3/uL — ABNORMAL HIGH (ref 1.7–7.7)
Neutrophils Relative %: 79 %
Platelets: 189 10*3/uL (ref 150–400)
RBC: 3.54 MIL/uL — ABNORMAL LOW (ref 3.87–5.11)
RDW: 13.9 % (ref 11.5–15.5)
WBC: 11.9 10*3/uL — ABNORMAL HIGH (ref 4.0–10.5)
nRBC: 0 % (ref 0.0–0.2)

## 2021-03-09 LAB — BASIC METABOLIC PANEL
Anion gap: 8 (ref 5–15)
BUN: 7 mg/dL — ABNORMAL LOW (ref 8–23)
CO2: 27 mmol/L (ref 22–32)
Calcium: 8.4 mg/dL — ABNORMAL LOW (ref 8.9–10.3)
Chloride: 99 mmol/L (ref 98–111)
Creatinine, Ser: 0.62 mg/dL (ref 0.44–1.00)
GFR, Estimated: >60 mL/min (ref >60)
Glucose, Bld: 110 mg/dL — ABNORMAL HIGH (ref 70–99)
Potassium: 3.5 mmol/L (ref 3.5–5.1)
Sodium: 134 mmol/L — ABNORMAL LOW (ref 135–145)

## 2021-03-09 LAB — MAGNESIUM: Magnesium: 1.7 mg/dL (ref 1.7–2.4)

## 2021-03-09 SURGERY — IRRIGATION AND DEBRIDEMENT EXTREMITY
Anesthesia: General | Site: Wrist | Laterality: Left

## 2021-03-09 MED ORDER — ONDANSETRON HCL 4 MG/2ML IJ SOLN
INTRAMUSCULAR | Status: DC | PRN
  Administered 2021-03-09: 4 mg via INTRAVENOUS

## 2021-03-09 MED ORDER — AMISULPRIDE (ANTIEMETIC) 5 MG/2ML IV SOLN: 10.0000 mg | Freq: Once | INTRAVENOUS | Status: DC | PRN

## 2021-03-09 MED ORDER — LIDOCAINE 2% (20 MG/ML) 5 ML SYRINGE
INTRAMUSCULAR | Status: AC
  Filled 2021-03-09: qty 5

## 2021-03-09 MED ORDER — SODIUM CHLORIDE 0.9 % IR SOLN
Status: DC | PRN
  Administered 2021-03-09: 3000 mL

## 2021-03-09 MED ORDER — PROPOFOL 10 MG/ML IV BOLUS
INTRAVENOUS | Status: AC
  Filled 2021-03-09: qty 20

## 2021-03-09 MED ORDER — FENTANYL CITRATE (PF) 100 MCG/2ML IJ SOLN
25.0000 ug | INTRAMUSCULAR | Status: DC | PRN
  Administered 2021-03-09 (×2): 25 ug via INTRAVENOUS

## 2021-03-09 MED ORDER — FENTANYL CITRATE (PF) 250 MCG/5ML IJ SOLN
INTRAMUSCULAR | Status: AC
  Filled 2021-03-09: qty 5

## 2021-03-09 MED ORDER — FENTANYL CITRATE (PF) 100 MCG/2ML IJ SOLN
INTRAMUSCULAR | Status: AC
  Filled 2021-03-09: qty 2

## 2021-03-09 MED ORDER — LACTATED RINGERS IV SOLN: INTRAVENOUS | Status: DC

## 2021-03-09 MED ORDER — PROPOFOL 10 MG/ML IV BOLUS
INTRAVENOUS | Status: DC | PRN
  Administered 2021-03-09: 140 mg via INTRAVENOUS

## 2021-03-09 MED ORDER — LIDOCAINE 2% (20 MG/ML) 5 ML SYRINGE
INTRAMUSCULAR | Status: DC | PRN
  Administered 2021-03-09: 80 mg via INTRAVENOUS

## 2021-03-09 MED ORDER — CHLORHEXIDINE GLUCONATE 0.12 % MT SOLN
15.0000 mL | OROMUCOSAL | Status: AC
  Filled 2021-03-09: qty 15

## 2021-03-09 MED ORDER — 0.9 % SODIUM CHLORIDE (POUR BTL) OPTIME
TOPICAL | Status: DC | PRN
  Administered 2021-03-09: 1000 mL

## 2021-03-09 MED ORDER — FENTANYL CITRATE (PF) 100 MCG/2ML IJ SOLN
INTRAMUSCULAR | Status: DC | PRN
  Administered 2021-03-09 (×2): 50 ug via INTRAVENOUS

## 2021-03-09 MED ORDER — CHLORHEXIDINE GLUCONATE 0.12 % MT SOLN
OROMUCOSAL | Status: AC
  Administered 2021-03-09: 15 mL via OROMUCOSAL
  Filled 2021-03-09: qty 15

## 2021-03-09 SURGICAL SUPPLY — 57 items
BNDG COHESIVE 1X5 TAN STRL LF (GAUZE/BANDAGES/DRESSINGS) IMPLANT
BNDG CONFORM 2 STRL LF (GAUZE/BANDAGES/DRESSINGS) IMPLANT
BNDG ELASTIC 3X5.8 VLCR STR LF (GAUZE/BANDAGES/DRESSINGS) ×2 IMPLANT
BNDG ELASTIC 4X5.8 VLCR STR LF (GAUZE/BANDAGES/DRESSINGS) ×2 IMPLANT
BNDG ESMARK 4X9 LF (GAUZE/BANDAGES/DRESSINGS) ×2 IMPLANT
BNDG GAUZE ELAST 4 BULKY (GAUZE/BANDAGES/DRESSINGS) ×2 IMPLANT
CORD BIPOLAR FORCEPS 12FT (ELECTRODE) ×2 IMPLANT
COVER SURGICAL LIGHT HANDLE (MISCELLANEOUS) ×2 IMPLANT
COVER WAND RF STERILE (DRAPES) ×2 IMPLANT
CUFF TOURN SGL QUICK 18X4 (TOURNIQUET CUFF) ×2 IMPLANT
CUFF TOURN SGL QUICK 24 (TOURNIQUET CUFF)
CUFF TRNQT CYL 24X4X16.5-23 (TOURNIQUET CUFF) IMPLANT
DRAIN PENROSE 1/4X12 LTX STRL (WOUND CARE) IMPLANT
DRAPE SURG 17X23 STRL (DRAPES) ×2 IMPLANT
DRSG ADAPTIC 3X8 NADH LF (GAUZE/BANDAGES/DRESSINGS) ×2 IMPLANT
ELECT REM PT RETURN 9FT ADLT (ELECTROSURGICAL)
ELECTRODE REM PT RTRN 9FT ADLT (ELECTROSURGICAL) IMPLANT
GAUZE SPONGE 4X4 12PLY STRL (GAUZE/BANDAGES/DRESSINGS) ×2 IMPLANT
GAUZE SPONGE 4X4 12PLY STRL LF (GAUZE/BANDAGES/DRESSINGS) ×2 IMPLANT
GAUZE XEROFORM 1X8 LF (GAUZE/BANDAGES/DRESSINGS) IMPLANT
GAUZE XEROFORM 5X9 LF (GAUZE/BANDAGES/DRESSINGS) IMPLANT
GLOVE BIOGEL PI IND STRL 8.5 (GLOVE) ×1 IMPLANT
GLOVE BIOGEL PI INDICATOR 8.5 (GLOVE) ×1
GLOVE SURG ORTHO 8.0 STRL STRW (GLOVE) ×2 IMPLANT
GOWN STRL REUS W/ TWL LRG LVL3 (GOWN DISPOSABLE) ×2 IMPLANT
GOWN STRL REUS W/ TWL XL LVL3 (GOWN DISPOSABLE) ×1 IMPLANT
GOWN STRL REUS W/TWL LRG LVL3 (GOWN DISPOSABLE) ×2
GOWN STRL REUS W/TWL XL LVL3 (GOWN DISPOSABLE) ×1
HANDPIECE INTERPULSE COAX TIP (DISPOSABLE)
KIT BASIN OR (CUSTOM PROCEDURE TRAY) ×2 IMPLANT
KIT TURNOVER KIT B (KITS) ×2 IMPLANT
MANIFOLD NEPTUNE II (INSTRUMENTS) ×2 IMPLANT
NEEDLE HYPO 25GX1X1/2 BEV (NEEDLE) ×2 IMPLANT
NS IRRIG 1000ML POUR BTL (IV SOLUTION) ×2 IMPLANT
PACK ORTHO EXTREMITY (CUSTOM PROCEDURE TRAY) ×2 IMPLANT
PAD ARMBOARD 7.5X6 YLW CONV (MISCELLANEOUS) ×4 IMPLANT
PAD CAST 4YDX4 CTTN HI CHSV (CAST SUPPLIES) ×1 IMPLANT
PADDING CAST COTTON 4X4 STRL (CAST SUPPLIES) ×1
SET CYSTO W/LG BORE CLAMP LF (SET/KITS/TRAYS/PACK) ×2 IMPLANT
SET HNDPC FAN SPRY TIP SCT (DISPOSABLE) IMPLANT
SOAP 2 % CHG 4 OZ (WOUND CARE) ×2 IMPLANT
SPLINT FIBERGLASS 3X12 (CAST SUPPLIES) ×2 IMPLANT
SPONGE LAP 18X18 RF (DISPOSABLE) ×2 IMPLANT
SPONGE LAP 4X18 RFD (DISPOSABLE) ×2 IMPLANT
SUT ETHILON 4 0 PS 2 18 (SUTURE) IMPLANT
SUT ETHILON 5 0 P 3 18 (SUTURE)
SUT NYLON ETHILON 5-0 P-3 1X18 (SUTURE) IMPLANT
SUT PROLENE 4 0 PS 2 18 (SUTURE) ×2 IMPLANT
SWAB COLLECTION DEVICE MRSA (MISCELLANEOUS) ×2 IMPLANT
SWAB CULTURE ESWAB REG 1ML (MISCELLANEOUS) ×2 IMPLANT
SYR CONTROL 10ML LL (SYRINGE) IMPLANT
TOWEL GREEN STERILE (TOWEL DISPOSABLE) ×2 IMPLANT
TOWEL GREEN STERILE FF (TOWEL DISPOSABLE) ×2 IMPLANT
TUBE CONNECTING 12X1/4 (SUCTIONS) ×2 IMPLANT
UNDERPAD 30X36 HEAVY ABSORB (UNDERPADS AND DIAPERS) ×2 IMPLANT
WATER STERILE IRR 1000ML POUR (IV SOLUTION) ×2 IMPLANT
YANKAUER SUCT BULB TIP NO VENT (SUCTIONS) ×2 IMPLANT

## 2021-03-09 NOTE — Transfer of Care (Signed)
Immediate Anesthesia Transfer of Care Note  Patient: Marie Lyons  Procedure(s) Performed: IRRIGATION AND DEBRIDEMENT LEFT  WRIST AND LEFT HAND (Left Wrist)  Patient Location: PACU  Anesthesia Type:General  Level of Consciousness: drowsy  Airway & Oxygen Therapy: Patient Spontanous Breathing  Post-op Assessment: Report given to RN and Post -op Vital signs reviewed and stable  Post vital signs: Reviewed and stable  Last Vitals:  Vitals Value Taken Time  BP    Temp    Pulse 82 03/09/21 2050  Resp 17 03/09/21 2050  SpO2 97 % 03/09/21 2050  Vitals shown include unvalidated device data.  Last Pain:  Vitals:   03/09/21 1548  TempSrc: Oral  PainSc:       Patients Stated Pain Goal: 0 (15/72/62 0355)  Complications: No complications documented.

## 2021-03-09 NOTE — Op Note (Signed)
PREOPERATIVE DIAGNOSIS:Left wrist/forearm and hand dog bite   POSTOPERATIVE DIAGNOSIS:Same  ATTENDING SURGEON: Dr. Iran Planas who scrubbed and present for the entire procedure  ASSISTANT SURGEON: None ANESTHESIA: General via laryngeal mask airway  OPERATIVE PROCEDURE: Incision and drainage of left distal forearm, wrist deep abscess Incision and drainage left hand subcutaneous palmar infection Incision and drainage left dorsal forearm subcutaneous infection. IMPLANTS: None  EBL: Minimal  RADIOGRAPHIC INTERPRETATION: None  SURGICAL INDICATIONS: Patient is a right-hand-dominant female who sustained a dog bite to the left wrist and forearm.  Patient seen evaluate in the hospital and recommended to undergo the above procedure.  The risks of surgery include but not limited to bleeding infection damage nearby nerves arteries or tendons loss of motion of the wrist and digits incomplete relief of symptoms and need for further surgical invention.  SURGICAL TECHNIQUE: Patient was palpated find the preoperative holding area marked the permanent marker made on the left wrist and hand indicate correct operative sites.  Patient brought back operating placed supine on anesthesia table where the general anesthetic was administered.  Patient had been on preoperative antibiotics.  Well-padded tourniquet was then placed on the left brachium stay with the appropriate drape.  Left upper extremities then prepped and draped normal sterile fashion.  A timeout was called the correct site identified procedure then begun.  Attention then turned to the left distal forearm.  The patient at the skin incision marked out.  This was in line with the FCU tendon.  Dissection carried down through the skin and subcutaneous tissue where the abscess was then encountered.  This extended all the way down through the FCU sheath.  Intraoperative cultures were taken.  Debridement of the FCU sheath was then carried out of the degenerative  and torn and infected tissue.  Careful protection of the ulnar nerve and artery was done throughout.  Copious wound irrigation was done and debridement was then carried out of the devitalized tissue with sharp scissors and knife.  After drainage of the abscess region attention was then turned to the dorsal of the forearm where the patient had the subcutaneous infection small incision was made on the dorsal of the forearm and this area was then thoroughly irrigated debrided along the margins.  Patient also had the puncture wound over the volar aspect of the hand in the mid palmar region.  This did not extend through the palmar fascia the subcutaneous tissue was then opened up the margins were then carefully debrided.  All wounds were then thoroughly irrigated.  After drainage of all 3 regions and debridement Adaptic dressing was then applied.  The wounds were loosely closed on the over the FCU sheath the other wounds were left open.  Sterile compressive bandage then applied after the Adaptic.  The patient is and placed in a well-padded volar splint taken recovery room in good condition.  POSTOPERATIVE PLAN: Patient be admitted back to the internal medicine service.  Continue with the IV antibiotics.  Is okay for the patient to go home tomorrow on Saturday on oral antibiotics would consider transitioning to something like Augmentin cultures were taken intraoperatively today.  I would need to see her back in the office on Monday.  I will be out of town late tomorrow evening.  My staff will be in the office on Monday and she needs to make an appointment to see my staff member on Monday in the office for wound check she needs to keep the splint on at all times.  Ice elevate please contact Halina Maidens, PA-C should any issues or concerns arise.  Telephone #[phone]

## 2021-03-09 NOTE — Anesthesia Postprocedure Evaluation (Signed)
Anesthesia Post Note  Patient: Farley Ly  Procedure(s) Performed: IRRIGATION AND DEBRIDEMENT LEFT  WRIST AND LEFT HAND (Left Wrist)     Patient location during evaluation: PACU Anesthesia Type: General Level of consciousness: awake and alert Pain management: pain level controlled Vital Signs Assessment: post-procedure vital signs reviewed and stable Respiratory status: spontaneous breathing, nonlabored ventilation, respiratory function stable and patient connected to nasal cannula oxygen Cardiovascular status: blood pressure returned to baseline and stable Postop Assessment: no apparent nausea or vomiting Anesthetic complications: no   No complications documented.  Last Vitals:  Vitals:   03/09/21 2130 03/09/21 2159  BP: (!) 150/79 (!) 147/78  Pulse: 94 90  Resp: 16 16  Temp:  36.7 C  SpO2: 96% 100%    Last Pain:  Vitals:   03/09/21 2159  TempSrc: Oral  PainSc:                  Tiajuana Amass

## 2021-03-09 NOTE — Plan of Care (Signed)

## 2021-03-09 NOTE — Plan of Care (Signed)
  Problem: Education: Goal: Knowledge of General Education information will improve Description: Including pain rating scale, medication(s)/side effects and non-pharmacologic comfort measures Outcome: Progressing   Problem: Health Behavior/Discharge Planning: Goal: Ability to manage health-related needs will improve Outcome: Progressing   Problem: Activity: Goal: Risk for activity intolerance will decrease Outcome: Progressing   Problem: Pain Managment: Goal: General experience of comfort will improve Outcome: Progressing   Problem: Safety: Goal: Ability to remain free from injury will improve Outcome: Progressing   Problem: Nutrition: Goal: Adequate nutrition will be maintained Outcome: Not Progressing

## 2021-03-09 NOTE — Anesthesia Preprocedure Evaluation (Signed)
Anesthesia Evaluation  Patient identified by MRN, date of birth, ID band Patient awake    Reviewed: Allergy & Precautions, NPO status , Patient's Chart, lab work & pertinent test results  Airway Mallampati: II  TM Distance: >3 FB Neck ROM: Full    Dental  (+) Dental Advisory Given   Pulmonary asthma , COPD, Current Smoker and Patient abstained from smoking.,    breath sounds clear to auscultation       Cardiovascular hypertension, Pt. on medications and Pt. on home beta blockers  Rhythm:Regular Rate:Normal     Neuro/Psych  Headaches, CVA    GI/Hepatic negative GI ROS, Neg liver ROS,   Endo/Other  negative endocrine ROS  Renal/GU negative Renal ROS     Musculoskeletal  (+) Arthritis ,   Abdominal   Peds  Hematology negative hematology ROS (+)   Anesthesia Other Findings   Reproductive/Obstetrics                             Anesthesia Physical Anesthesia Plan  ASA: III  Anesthesia Plan: General   Post-op Pain Management:    Induction: Intravenous  PONV Risk Score and Plan: 2 and Dexamethasone, Ondansetron and Treatment may vary due to age or medical condition  Airway Management Planned: LMA  Additional Equipment: None  Intra-op Plan:   Post-operative Plan: Extubation in OR  Informed Consent: I have reviewed the patients History and Physical, chart, labs and discussed the procedure including the risks, benefits and alternatives for the proposed anesthesia with the patient or authorized representative who has indicated his/her understanding and acceptance.     Dental advisory given  Plan Discussed with: CRNA  Anesthesia Plan Comments:         Anesthesia Quick Evaluation

## 2021-03-09 NOTE — Consult Note (Signed)
Reason for Consult:left arm and hand dog bite Referring Physician: [facility]  Marie Lyons is an 71 y.o. female.  HPI: Patient is a right-hand-dominant female who was transferred from [place] urgent care facility.  Patient was admitted to the [facility] service with a dog bite with potential abscess.  Patient was seen and evaluated today given the worsening infection is recommended that she undergo incision and drainage.  Patient is currently on IV antibiotics.  Past Medical History:  Diagnosis Date  . Cardiac arrhythmia   . Chronic back pain   . High cholesterol   . Migraine   . Scoliosis   . Stroke [facility])     Past Surgical History:  Procedure Laterality Date  . ABDOMINAL HYSTERECTOMY    . BUNIONECTOMY    . ROTATOR CUFF REPAIR      Family History  Problem Relation Age of Onset  . CAD Father   . Stroke Neg Hx     Social History:  reports that she has been smoking cigarettes. She has a 12.50 pack-year smoking history. She has never used smokeless tobacco. She reports that she does not drink alcohol and does not use drugs.  Allergies:  Allergies  Allergen Reactions  . Ciprofloxacin Shortness Of Breath    Denies this allergy on 09/23/16 (??)  . Keflex [Cephalexin] Anaphylaxis  . Penicillins Anaphylaxis    Has patient had a PCN reaction causing immediate rash, facial/tongue/throat swelling, SOB or lightheadedness with hypotension: Yes Has patient had a PCN reaction causing severe rash involving mucus membranes or skin necrosis: Yes Has patient had a PCN reaction that required hospitalization Yes Has patient had a PCN reaction occurring within the last 10 years: No If all of the above answers are "NO", then may proceed with Cephalosporin use.   . Aspirin Other (See Comments)    Aggravates ulcer  . Darvon [Propoxyphene Hcl] Other (See Comments)    Wheezing  . Ibuprofen Other (See Comments)    Wheezing  . Imitrex [Sumatriptan] Other (See Comments)     Contraindicated because pt had a stroke  . Meperidine Other (See Comments)    Unknown reaction (per patient)  . Monosodium Glutamate Other (See Comments)    Migraine   . Nitrates, Organic Other (See Comments)    Migraine   . Nsaids Other (See Comments)    Aggravates ulcer  . Sulfa Antibiotics Other (See Comments)    Unknown reaction (per patient)    Medications: I have reviewed the patient's current medications.  Results for orders placed or performed during the hospital encounter of 03/08/21 (from the past 48 hour(s))  Comprehensive metabolic panel     Status: Abnormal   Collection Time: 03/08/21  3:28 PM  Result Value Ref Range   Sodium 136 135 - 145 mmol/L   Potassium 3.4 (L) 3.5 - 5.1 mmol/L   Chloride 98 98 - 111 mmol/L   CO2 30 22 - 32 mmol/L   Glucose, Bld 92 70 - 99 mg/dL    Comment: Glucose reference range applies only to samples taken after fasting for at least 8 hours.   BUN 11 8 - 23 mg/dL   Creatinine, Ser 0.66 0.44 - 1.00 mg/dL   Calcium 9.1 8.9 - 10.3 mg/dL   Total Protein 7.1 6.5 - 8.1 g/dL   Albumin 3.9 3.5 - 5.0 g/dL   AST 15 15 - 41 U/L   ALT 11 0 - 44 U/L   Alkaline Phosphatase 52 38 - 126 U/L  Total Bilirubin 0.3 0.3 - 1.2 mg/dL   GFR, Estimated >60 >60 mL/min    Comment: (NOTE) Calculated using the CKD-EPI Creatinine Equation (2021)    Anion gap 8 5 - 15  Lactic acid, plasma     Status: None   Collection Time: 03/08/21  3:28 PM  Result Value Ref Range   Lactic Acid, Venous 1.9 0.5 - 1.9 mmol/L  CBC with Differential     Status: Abnormal   Collection Time: 03/08/21  3:28 PM  Result Value Ref Range   WBC 12.7 (H) 4.0 - 10.5 K/uL   RBC 3.87 3.87 - 5.11 MIL/uL   Hemoglobin 12.0 12.0 - 15.0 g/dL   HCT 37.9 36.0 - 46.0 %   MCV 97.9 80.0 - 100.0 fL   MCH 31.0 26.0 - 34.0 pg   MCHC 31.7 30.0 - 36.0 g/dL   RDW 14.1 11.5 - 15.5 %   Platelets 195 150 - 400 K/uL   nRBC 0.0 0.0 - 0.2 %   Neutrophils Relative % 84 %   Neutro Abs 10.6 (H) 1.7 - 7.7  K/uL   Lymphocytes Relative 8 %   Lymphs Abs 1.0 0.7 - 4.0 K/uL   Monocytes Relative 7 %   Monocytes Absolute 0.8 0.1 - 1.0 K/uL   Eosinophils Relative 1 %   Eosinophils Absolute 0.1 0.0 - 0.5 K/uL   Basophils Relative 0 %   Basophils Absolute 0.1 0.0 - 0.1 K/uL   Immature Granulocytes 0 %   Abs Immature Granulocytes 0.05 0.00 - 0.07 K/uL  Culture, blood (routine x 2)     Status: None (Preliminary result)   Collection Time: 03/08/21  3:28 PM   Specimen: BLOOD RIGHT FOREARM  Result Value Ref Range   Specimen Description BLOOD RIGHT FOREARM    Special Requests      BOTTLES DRAWN AEROBIC AND ANAEROBIC Blood Culture adequate volume   Culture      NO GROWTH < 24 HOURS Performed at [facility], [address] [place], [place] [address]    Report Status PENDING   Culture, blood (routine x 2)     Status: None (Preliminary result)   Collection Time: 03/08/21  3:31 PM   Specimen: BLOOD  Result Value Ref Range   Specimen Description BLOOD LEFT ANTECUBITAL    Special Requests      BOTTLES DRAWN AEROBIC AND ANAEROBIC Blood Culture adequate volume   Culture      NO GROWTH < 24 HOURS Performed at [facility], [place] [address] [place], [place] [address]    Report Status PENDING   Resp Panel by RT-PCR (Flu A&B, Covid) Nasopharyngeal Swab     Status: None   Collection Time: 03/08/21  4:42 PM   Specimen: Nasopharyngeal Swab; Nasopharyngeal(NP) swabs in vial transport medium  Result Value Ref Range   SARS Coronavirus 2 by RT PCR NEGATIVE NEGATIVE    Comment: (NOTE) SARS-CoV-2 target nucleic acids are NOT DETECTED.  The SARS-CoV-2 RNA is generally detectable in upper respiratory specimens during the acute phase of infection. The lowest concentration of SARS-CoV-2 viral copies this assay can detect is 138 copies/mL. A negative result does not preclude SARS-Cov-2 infection and should not be used as the sole basis for treatment or other patient management decisions. A negative  result may occur with  improper specimen collection/handling, submission of specimen other than nasopharyngeal swab, presence of viral mutation(s) within the areas targeted by this assay, and inadequate number of viral copies(<138  copies/mL). A negative result must be combined with clinical observations, patient history, and epidemiological information. The expected result is Negative.  Fact Sheet for Patients:  EntrepreneurPulse.com.au  Fact Sheet for Healthcare Providers:  IncredibleEmployment.be  This test is no t yet approved or cleared by the [place] FDA and  has been authorized for detection and/or diagnosis of SARS-CoV-2 by FDA under an Emergency Use Authorization (EUA). This EUA will remain  in effect (meaning this test can be used) for the duration of the COVID-19 declaration under Section 564(b)(1) of the Act, 21 U.S.C.section 360bbb-3(b)(1), unless the authorization is terminated  or revoked sooner.       Influenza A by PCR NEGATIVE NEGATIVE   Influenza B by PCR NEGATIVE NEGATIVE    Comment: (NOTE) The Xpert Xpress SARS-CoV-2/FLU/RSV plus assay is intended as an aid in the diagnosis of influenza from Nasopharyngeal swab specimens and should not be used as a sole basis for treatment. Nasal washings and aspirates are unacceptable for Xpert Xpress SARS-CoV-2/FLU/RSV testing.  Fact Sheet for Patients: EntrepreneurPulse.com.au  Fact Sheet for Healthcare Providers: IncredibleEmployment.be  This test is not yet approved or cleared by the [place] FDA and has been authorized for detection and/or diagnosis of SARS-CoV-2 by FDA under an Emergency Use Authorization (EUA). This EUA will remain in effect (meaning this test can be used) for the duration of the COVID-19 declaration under Section 564(b)(1) of the Act, 21 U.S.C. section 360bbb-3(b)(1), unless the authorization is terminated  or revoked.    Basic metabolic panel     Status: Abnormal   Collection Time: 03/09/21  2:55 AM  Result Value Ref Range   Sodium 134 (L) 135 - 145 mmol/L   Potassium 3.5 3.5 - 5.1 mmol/L   Chloride 99 98 - 111 mmol/L   CO2 27 22 - 32 mmol/L   Glucose, Bld 110 (H) 70 - 99 mg/dL    Comment: Glucose reference range applies only to samples taken after fasting for at least 8 hours.   BUN 7 (L) 8 - 23 mg/dL   Creatinine, Ser 0.62 0.44 - 1.00 mg/dL   Calcium 8.4 (L) 8.9 - 10.3 mg/dL   GFR, Estimated >60 >60 mL/min    Comment: (NOTE) Calculated using the CKD-EPI Creatinine Equation (2021)    Anion gap 8 5 - 15    Comment: Performed at [facility] [address] [place], [place] [address]  Magnesium     Status: None   Collection Time: 03/09/21  2:55 AM  Result Value Ref Range   Magnesium 1.7 1.7 - 2.4 mg/dL    Comment: Performed at [facility], [place] [address], [place], [place] [address]  CBC with Differential/Platelet     Status: Abnormal   Collection Time: 03/09/21  2:55 AM  Result Value Ref Range   WBC 11.9 (H) 4.0 - 10.5 K/uL   RBC 3.54 (L) 3.87 - 5.11 MIL/uL   Hemoglobin 10.9 (L) 12.0 - 15.0 g/dL   HCT 34.2 (L) 36.0 - 46.0 %   MCV 96.6 80.0 - 100.0 fL   MCH 30.8 26.0 - 34.0 pg   MCHC 31.9 30.0 - 36.0 g/dL   RDW 13.9 11.5 - 15.5 %   Platelets 189 150 - 400 K/uL   nRBC 0.0 0.0 - 0.2 %   Neutrophils Relative % 79 %   Neutro Abs 9.5 (H) 1.7 - 7.7 K/uL   Lymphocytes Relative 11 %   Lymphs Abs 1.3 0.7 - 4.0 K/uL  Monocytes Relative 8 %   Monocytes Absolute 0.9 0.1 - 1.0 K/uL   Eosinophils Relative 1 %   Eosinophils Absolute 0.1 0.0 - 0.5 K/uL   Basophils Relative 0 %   Basophils Absolute 0.0 0.0 - 0.1 K/uL   Immature Granulocytes 1 %   Abs Immature Granulocytes 0.06 0.00 - 0.07 K/uL    Comment: Performed at [facility] [address] [place], [place] [address]    DG Chest 2 View  Result Date: 03/08/2021 CLINICAL DATA:  Wheezing and cough. EXAM:  CHEST - 2 VIEW COMPARISON:  November 18, 2020 FINDINGS: The lungs are hyperinflated. Mild, diffuse, chronic appearing increased interstitial lung markings are seen. Mild areas of scarring and/or atelectasis are seen within the bilateral lung bases. This is decreased in severity when compared to the prior study. There is no evidence of a pleural effusion or pneumothorax. The heart size and mediastinal contours are within normal limits. Moderate to marked severity S-shape scoliosis of the thoracic spine is seen. IMPRESSION: COPD with mild bibasilar scarring and/or atelectasis. Electronically Signed   By: Virgina Norfolk M.D.   On: 03/08/2021 16:14   DG Wrist Complete Left  Result Date: 03/08/2021 CLINICAL DATA:  Status post dog bite to the left forearm/hand. EXAM: LEFT WRIST - COMPLETE 3+ VIEW COMPARISON:  None. FINDINGS: There is no evidence of an acute fracture or dislocation. Moderate to marked severity degenerative changes an adjacent soft tissue calcification are seen along the radiocarpal joint and carpometacarpal articulation of the left thumb. Mild diffuse soft tissue swelling is seen. IMPRESSION: Chronic and degenerative changes without evidence of acute fracture. Electronically Signed   By: Virgina Norfolk M.D.   On: 03/08/2021 16:20   DG Hand Complete Left  Result Date: 03/08/2021 CLINICAL DATA:  Status post dog bite to the left forearm/hand. EXAM: LEFT HAND - COMPLETE 3+ VIEW COMPARISON:  February 27, 2018 FINDINGS: There is no evidence of acute fracture or dislocation. Stable chronic and postoperative changes are seen involving the distal aspects of the second and third left metacarpals and adjacent MCP joints. A radiopaque surgical screw is again seen overlying the middle and distal phalanges of the fifth left finger. Degenerative changes are seen involving numerous interphalangeal joints, the left wrist and the carpometacarpal articulation of the left thumb. Soft tissue swelling is seen  adjacent to the first metacarpophalangeal articulation. IMPRESSION: Stable chronic and postoperative changes without evidence of acute osseous abnormality. Electronically Signed   By: Virgina Norfolk M.D.   On: 03/08/2021 16:18    ROS AS NOTED IN MEDICAL CHART Blood pressure 137/76, pulse 95, temperature 99.1 F (37.3 C), temperature source Oral, resp. rate 15, height 5\' 3"  (1.6 m), weight 46.3 kg, SpO2 92 %. Physical Exam  General Appearance:  Alert, cooperative, no distress, appears stated age  Head:  Normocephalic, without obvious abnormality, atraumatic  Eyes:  Pupils equal, conjunctiva/corneas clear,         Throat: Lips, mucosa, and tongue normal; teeth and gums normal  Neck: No visible masses     Lungs:   respirations unlabored  Chest Wall:  No tenderness or deformity  Heart:  Regular rate and rhythm,  Abdomen:   Soft, non-tender,         Extremities:  Patient does have the volar abscess directly over the ulnar side of the wrist crease. Streaking erythema and lymphangitis. Tender to palpation in the mid palm. Good capillary refill good blood flow.  Pulses: 2+ and symmetric  Skin:  Skin color, texture, turgor normal, no rashes or lesions     Neurologic: Normal    Assessment/Plan: Left dog bite with worsening abscess  Today the findings were reviewed with the patient we talked about the reason the rationale for irrigation and debridement.  The patient understanding the reason the rationale for the intervention.  Plan to schedule accordingly do tonight. We talked about the risks of surgery to include but not limited to bleeding infection damage nearby nerves arteries or tendons persistent infection and need for further surgical intervention. Patient will continue on the IV antibiotics.  Wound cultures will be taken. R/B/A DISCUSSED WITH PT IN HOSPITAL.  PT VOICED UNDERSTANDING OF PLAN CONSENT SIGNED DAY OF SURGERY PT SEEN AND EXAMINED PRIOR TO OPERATIVE PROCEDURE/DAY OF  SURGERY SITE MARKED. QUESTIONS ANSWERED WILL REMAIN AN INPATIENT FOLLOWING SURGERY  Janelle Floor [facility] 03/09/2021, 6:02 PM

## 2021-03-09 NOTE — Progress Notes (Signed)
PROGRESS NOTE    Marie Lyons   WFU:[id number]  DOB: [birth year]  PCP: Maury Dus, MD    DOA: 03/08/2021 LOS: 0   Brief Narrative   71 y.o. female with medical history significant for COPD, depression, history of CVA, chronic back pain, and hypertension, now presenting to the emergency department for evaluation of pain, redness, and swelling involving the left wrist and forearm after a dog bite on 03/06/2021.  She reported progressively worsening pain most severe at the ulnar aspect of her left wrist on the palmar side and erythema extending up to the elbow.   Transferred from [facility] to [employer] [facility]. Dr. Caralyn Guile, hand surgeon to see patient and likely take to the OR.   Assessment & Plan   Principal Problem:   Infected dog bite of hand Active Problems:   Essential hypertension   COPD (chronic obstructive pulmonary disease) (HCC)   History of CVA (cerebrovascular accident)   Dog bite of left wrist with infection   Infected dog bite involving the left wrist - did not meet for sepsis criteria on admission.  --Dr. Caralyn Guile, hand surgeon, is consulted, likely to take patient to OR later today --NPO ex sips w/meds for now --Follow blood cultures --Continue antibiotics: Rocephin and Clindamycin --Maintain wrist splint until further instructions from surgery --Monitor physical exam for worsening pain, swelling or proximally extending erythema   COPD - not acutely exacerbated, not wheezing.  Continue PRN Albuterol.  Hypertension - chronic, stable. Continue metoprolol.  History of CVA - without residual deficits. Continue statin.  Not on ASA due to reported intolerance.  Depression - stable. Continue Lexapro.   DVT prophylaxis: SCDs Start: 03/08/21 2121   Diet:  Diet Orders (From admission, onward)    Start     Ordered   03/09/21 0001  Diet NPO time specified Except for: Sips with Meds, Ice Chips  Diet effective midnight       Question Answer Comment  Except for  Sips with Meds   Except for Ice Chips      03/08/21 2127            Code Status: Full Code    Subjective 03/09/21    Pt reports having severe pain in the left wrist and hand on ulnar side.  Has pain just moving her fingers.  Asks about surgery and if she can eat yet.  No fever/chills since admission.  No other acute complaints.    Disposition Plan & Communication   Status is: Observation  The patient remains OBS appropriate and will d/c before 2 midnights.  Dispo: The patient is from: Home              Anticipated d/c is to: Home              Patient currently is not medically stable to d/c.   Difficult to place patient No   Consults, Procedures, Significant Events   Consultants:   Copy, Dr. Caralyn Guile  Procedures:   Wrist split applied    Antimicrobials:  Anti-infectives (From admission, onward)   Start     Dose/Rate Route Frequency Ordered Stop   03/09/21 0115  clindamycin (CLEOCIN) IVPB 600 mg        600 mg 100 mL/hr over 30 Minutes Intravenous Every 8 hours 03/08/21 2106     03/08/21 2200  cefTRIAXone (ROCEPHIN) 2 g in sodium chloride 0.9 % 100 mL IVPB        2 g 200 mL/hr over 30  Minutes Intravenous Every 24 hours 03/08/21 2105     03/08/21 1715  clindamycin (CLEOCIN) IVPB 600 mg        600 mg 100 mL/hr over 30 Minutes Intravenous  Once 03/08/21 1707 03/08/21 1847   03/08/21 1600  doxycycline (VIBRAMYCIN) 100 mg in sodium chloride 0.9 % 250 mL IVPB  Status:  Discontinued        100 mg 125 mL/hr over 120 Minutes Intravenous  Once 03/08/21 1556 03/08/21 2054        Micro    Objective   Vitals:   03/08/21 1953 03/08/21 2035 03/09/21 0300 03/09/21 0801  BP: 129/80 123/87 133/72 106/79  Pulse: 96 (!) 101 88 83  Resp: 18 17 17 17   Temp: 98.4 F (36.9 C) 99.4 F (37.4 C) 98.9 F (37.2 C) 98.6 F (37 C)  TempSrc: Oral Oral Oral Oral  SpO2: 95% 90% 93% 100%  Weight:      Height:        Intake/Output Summary (Last 24 hours) at 03/09/2021  1358 Last data filed at 03/09/2021 0900 Gross per 24 hour  Intake 635.16 ml  Output --  Net 635.16 ml   Filed Weights   03/08/21 1504  Weight: 46.3 kg    Physical Exam:  General exam: awake, alert, no acute distress HEENT: atraumatic, clear conjunctiva, anicteric sclera, moist mucus membranes, hearing grossly normal  Respiratory system: CTAB, no wheezes, rales or rhonchi, normal respiratory effort. Cardiovascular system: normal S1/S2, RRR, no pedal edema.   Gastrointestinal system: soft, NT, ND Central nervous system: A&O x3. no gross focal neurologic deficits, normal speech Extremities: Left wrist in splint and Ace wrap, proximally small area of induration erythema, sensation intact in distal fingers, pain with passive range of motion of fingers.  No lower extremity edema.  Normal tone. Psychiatry: normal mood, congruent affect, judgement and insight appear normal  Labs   Data Reviewed: I have personally reviewed following labs and imaging studies  CBC: Recent Labs  Lab 03/08/21 1528 03/09/21 0255  WBC 12.7* 11.9*  NEUTROABS 10.6* 9.5*  HGB 12.0 10.9*  HCT 37.9 34.2*  MCV 97.9 96.6  PLT 195 616   Basic Metabolic Panel: Recent Labs  Lab 03/08/21 1528 03/09/21 0255  NA 136 134*  K 3.4* 3.5  CL 98 99  CO2 30 27  GLUCOSE 92 110*  BUN 11 7*  CREATININE 0.66 0.62  CALCIUM 9.1 8.4*  MG  --  1.7   GFR: Estimated Creatinine Clearance: 46.5 mL/min (by C-G formula based on SCr of 0.62 mg/dL). Liver Function Tests: Recent Labs  Lab 03/08/21 1528  AST 15  ALT 11  ALKPHOS 52  BILITOT 0.3  PROT 7.1  ALBUMIN 3.9   No results for input(s): LIPASE, AMYLASE in the last 168 hours. No results for input(s): AMMONIA in the last 168 hours. Coagulation Profile: No results for input(s): INR, PROTIME in the last 168 hours. Cardiac Enzymes: No results for input(s): CKTOTAL, CKMB, CKMBINDEX, TROPONINI in the last 168 hours. BNP (last 3 results) No results for input(s):  PROBNP in the last 8760 hours. HbA1C: No results for input(s): HGBA1C in the last 72 hours. CBG: No results for input(s): GLUCAP in the last 168 hours. Lipid Profile: No results for input(s): CHOL, HDL, LDLCALC, TRIG, CHOLHDL, LDLDIRECT in the last 72 hours. Thyroid Function Tests: No results for input(s): TSH, T4TOTAL, FREET4, T3FREE, THYROIDAB in the last 72 hours. Anemia Panel: No results for input(s): VITAMINB12, FOLATE, FERRITIN, TIBC, IRON, RETICCTPCT  in the last 72 hours. Sepsis Labs: Recent Labs  Lab 03/08/21 1528  LATICACIDVEN 1.9    Recent Results (from the past 240 hour(s))  Culture, blood (routine x 2)     Status: None (Preliminary result)   Collection Time: 03/08/21  3:28 PM   Specimen: BLOOD RIGHT FOREARM  Result Value Ref Range Status   Specimen Description BLOOD RIGHT FOREARM  Final   Special Requests   Final    BOTTLES DRAWN AEROBIC AND ANAEROBIC Blood Culture adequate volume Performed at [facility], [place] [address], [place], [place] [address]    Culture PENDING  Incomplete   Report Status PENDING  Incomplete  Culture, blood (routine x 2)     Status: None (Preliminary result)   Collection Time: 03/08/21  3:31 PM   Specimen: BLOOD  Result Value Ref Range Status   Specimen Description BLOOD LEFT ANTECUBITAL  Final   Special Requests   Final    BOTTLES DRAWN AEROBIC AND ANAEROBIC Blood Culture adequate volume Performed at [facility], [place] [address], [place],  [address]    Culture PENDING  Incomplete   Report Status PENDING  Incomplete  Resp Panel by RT-PCR (Flu A&B, Covid) Nasopharyngeal Swab     Status: None   Collection Time: 03/08/21  4:42 PM   Specimen: Nasopharyngeal Swab; Nasopharyngeal(NP) swabs in vial transport medium  Result Value Ref Range Status   SARS Coronavirus 2 by RT PCR NEGATIVE NEGATIVE Final    Comment: (NOTE) SARS-CoV-2 target nucleic acids are NOT DETECTED.  The SARS-CoV-2 RNA is generally detectable in upper  respiratory specimens during the acute phase of infection. The lowest concentration of SARS-CoV-2 viral copies this assay can detect is 138 copies/mL. A negative result does not preclude SARS-Cov-2 infection and should not be used as the sole basis for treatment or other patient management decisions. A negative result may occur with  improper specimen collection/handling, submission of specimen other than nasopharyngeal swab, presence of viral mutation(s) within the areas targeted by this assay, and inadequate number of viral copies(<138 copies/mL). A negative result must be combined with clinical observations, patient history, and epidemiological information. The expected result is Negative.  Fact Sheet for Patients:  EntrepreneurPulse.com.au  Fact Sheet for Healthcare Providers:  IncredibleEmployment.be  This test is no t yet approved or cleared by the [place] FDA and  has been authorized for detection and/or diagnosis of SARS-CoV-2 by FDA under an Emergency Use Authorization (EUA). This EUA will remain  in effect (meaning this test can be used) for the duration of the COVID-19 declaration under Section 564(b)(1) of the Act, 21 U.S.C.section 360bbb-3(b)(1), unless the authorization is terminated  or revoked sooner.       Influenza A by PCR NEGATIVE NEGATIVE Final   Influenza B by PCR NEGATIVE NEGATIVE Final    Comment: (NOTE) The Xpert Xpress SARS-CoV-2/FLU/RSV plus assay is intended as an aid in the diagnosis of influenza from Nasopharyngeal swab specimens and should not be used as a sole basis for treatment. Nasal washings and aspirates are unacceptable for Xpert Xpress SARS-CoV-2/FLU/RSV testing.  Fact Sheet for Patients: EntrepreneurPulse.com.au  Fact Sheet for Healthcare Providers: IncredibleEmployment.be  This test is not yet approved or cleared by the [place] FDA and has been  authorized for detection and/or diagnosis of SARS-CoV-2 by FDA under an Emergency Use Authorization (EUA). This EUA will remain in effect (meaning this test can be used) for the duration of the COVID-19 declaration under Section 564(b)(1)  of the Act, 21 U.S.C. section 360bbb-3(b)(1), unless the authorization is terminated or revoked.        Imaging Studies   DG Chest 2 View  Result Date: 03/08/2021 CLINICAL DATA:  Wheezing and cough. EXAM: CHEST - 2 VIEW COMPARISON:  November 18, 2020 FINDINGS: The lungs are hyperinflated. Mild, diffuse, chronic appearing increased interstitial lung markings are seen. Mild areas of scarring and/or atelectasis are seen within the bilateral lung bases. This is decreased in severity when compared to the prior study. There is no evidence of a pleural effusion or pneumothorax. The heart size and mediastinal contours are within normal limits. Moderate to marked severity S-shape scoliosis of the thoracic spine is seen. IMPRESSION: COPD with mild bibasilar scarring and/or atelectasis. Electronically Signed   By: Virgina Norfolk M.D.   On: 03/08/2021 16:14   DG Wrist Complete Left  Result Date: 03/08/2021 CLINICAL DATA:  Status post dog bite to the left forearm/hand. EXAM: LEFT WRIST - COMPLETE 3+ VIEW COMPARISON:  None. FINDINGS: There is no evidence of an acute fracture or dislocation. Moderate to marked severity degenerative changes an adjacent soft tissue calcification are seen along the radiocarpal joint and carpometacarpal articulation of the left thumb. Mild diffuse soft tissue swelling is seen. IMPRESSION: Chronic and degenerative changes without evidence of acute fracture. Electronically Signed   By: Virgina Norfolk M.D.   On: 03/08/2021 16:20   DG Hand Complete Left  Result Date: 03/08/2021 CLINICAL DATA:  Status post dog bite to the left forearm/hand. EXAM: LEFT HAND - COMPLETE 3+ VIEW COMPARISON:  February 27, 2018 FINDINGS: There is no evidence of acute  fracture or dislocation. Stable chronic and postoperative changes are seen involving the distal aspects of the second and third left metacarpals and adjacent MCP joints. A radiopaque surgical screw is again seen overlying the middle and distal phalanges of the fifth left finger. Degenerative changes are seen involving numerous interphalangeal joints, the left wrist and the carpometacarpal articulation of the left thumb. Soft tissue swelling is seen adjacent to the first metacarpophalangeal articulation. IMPRESSION: Stable chronic and postoperative changes without evidence of acute osseous abnormality. Electronically Signed   By: Virgina Norfolk M.D.   On: 03/08/2021 16:18     Medications   Scheduled Meds: . cyclobenzaprine  10 mg Oral QHS  . escitalopram  20 mg Oral Daily  . melatonin  10 mg Oral QHS  . metoprolol succinate  25 mg Oral QHS  . simvastatin  20 mg Oral QHS   Continuous Infusions: . sodium chloride Stopped (03/08/21 1847)  . cefTRIAXone (ROCEPHIN)  IV 2 g (03/08/21 2225)  . clindamycin (CLEOCIN) IV 600 mg (03/09/21 0816)       LOS: 0 days    Time spent: 30 minutes    Ezekiel Slocumb, DO [facility]  03/09/2021, 1:58 PM      If 7PM-7AM, please contact night-coverage. How to contact the [facility] Attending or Consulting provider [place] or covering provider during after hours [place], for this patient?    1. Check the care team in [facility] and look for a) attending/consulting [facility] provider listed and b) the [facility] team listed 2. Log into [email or web address] and use [facility]'s universal password to access. If you do not have the password, please contact the hospital operator. 3. Locate the [facility] provider you are looking for under [facility] and page to a number that you can be directly reached. 4. If you still have difficulty reaching the provider, please  page the [facility] (Director on Call) for the Hospitalists listed on amion for assistance.

## 2021-03-09 NOTE — Anesthesia Procedure Notes (Signed)
Procedure Name: LMA Insertion Date/Time: 03/09/2021 8:12 PM Performed by: Babs Bertin, CRNA Pre-anesthesia Checklist: Patient identified, Emergency Drugs available, Suction available and Patient being monitored Patient Re-evaluated:Patient Re-evaluated prior to induction Oxygen Delivery Method: Circle System Utilized Preoxygenation: Pre-oxygenation with 100% oxygen Induction Type: IV induction Ventilation: Mask ventilation without difficulty LMA: LMA inserted LMA Size: 4.0 Number of attempts: 1 Airway Equipment and Method: Bite block Placement Confirmation: positive ETCO2 Tube secured with: Tape Dental Injury: Teeth and Oropharynx as per pre-operative assessment

## 2021-03-10 ENCOUNTER — Encounter (HOSPITAL_COMMUNITY): Payer: Self-pay | Admitting: Orthopedic Surgery

## 2021-03-10 DIAGNOSIS — S61552A Open bite of left wrist, initial encounter: Secondary | ICD-10-CM | POA: Diagnosis not present

## 2021-03-10 DIAGNOSIS — R636 Underweight: Secondary | ICD-10-CM

## 2021-03-10 DIAGNOSIS — I10 Essential (primary) hypertension: Secondary | ICD-10-CM | POA: Diagnosis not present

## 2021-03-10 DIAGNOSIS — S61452A Open bite of left hand, initial encounter: Secondary | ICD-10-CM | POA: Diagnosis not present

## 2021-03-10 DIAGNOSIS — J42 Unspecified chronic bronchitis: Secondary | ICD-10-CM

## 2021-03-10 DIAGNOSIS — J449 Chronic obstructive pulmonary disease, unspecified: Secondary | ICD-10-CM | POA: Diagnosis not present

## 2021-03-10 LAB — DIFFERENTIAL
Abs Immature Granulocytes: 0.03 10*3/uL (ref 0.00–0.07)
Basophils Absolute: 0 10*3/uL (ref 0.0–0.1)
Basophils Relative: 0 %
Eosinophils Absolute: 0 10*3/uL (ref 0.0–0.5)
Eosinophils Relative: 0 %
Immature Granulocytes: 0 %
Lymphocytes Relative: 9 %
Lymphs Abs: 1 10*3/uL (ref 0.7–4.0)
Monocytes Absolute: 0.9 10*3/uL (ref 0.1–1.0)
Monocytes Relative: 8 %
Neutro Abs: 9.7 10*3/uL — ABNORMAL HIGH (ref 1.7–7.7)
Neutrophils Relative %: 83 %

## 2021-03-10 LAB — CBC
HCT: 33.9 % — ABNORMAL LOW (ref 36.0–46.0)
Hemoglobin: 10.7 g/dL — ABNORMAL LOW (ref 12.0–15.0)
MCH: 31.2 pg (ref 26.0–34.0)
MCHC: 31.6 g/dL (ref 30.0–36.0)
MCV: 98.8 fL (ref 80.0–100.0)
Platelets: 175 10*3/uL (ref 150–400)
RBC: 3.43 MIL/uL — ABNORMAL LOW (ref 3.87–5.11)
RDW: 14.2 % (ref 11.5–15.5)
WBC: 11.8 10*3/uL — ABNORMAL HIGH (ref 4.0–10.5)
nRBC: 0 % (ref 0.0–0.2)

## 2021-03-10 LAB — BASIC METABOLIC PANEL
Anion gap: 11 (ref 5–15)
BUN: 9 mg/dL (ref 8–23)
CO2: 23 mmol/L (ref 22–32)
Calcium: 8 mg/dL — ABNORMAL LOW (ref 8.9–10.3)
Chloride: 96 mmol/L — ABNORMAL LOW (ref 98–111)
Creatinine, Ser: 0.65 mg/dL (ref 0.44–1.00)
GFR, Estimated: >60 mL/min (ref >60)
Glucose, Bld: 117 mg/dL — ABNORMAL HIGH (ref 70–99)
Potassium: 4 mmol/L (ref 3.5–5.1)
Sodium: 130 mmol/L — ABNORMAL LOW (ref 135–145)

## 2021-03-10 LAB — MAGNESIUM: Magnesium: 1.6 mg/dL — ABNORMAL LOW (ref 1.7–2.4)

## 2021-03-10 MED ORDER — MAGNESIUM SULFATE 2 GM/50ML IV SOLN
2.0000 g | Freq: Once | INTRAVENOUS | Status: AC
  Administered 2021-03-10: 2 g via INTRAVENOUS
  Filled 2021-03-10 (×2): qty 50

## 2021-03-10 MED ORDER — ADULT MULTIVITAMIN W/MINERALS CH
1.0000 | ORAL_TABLET | Freq: Every day | ORAL | Status: DC
  Administered 2021-03-10 – 2021-03-15 (×5): 1 via ORAL
  Filled 2021-03-10 (×6): qty 1

## 2021-03-10 MED ORDER — ENSURE ENLIVE PO LIQD
237.0000 mL | Freq: Two times a day (BID) | ORAL | Status: DC
  Administered 2021-03-11 – 2021-03-15 (×8): 237 mL via ORAL

## 2021-03-10 MED ORDER — OXYMETAZOLINE HCL 0.05 % NA SOLN
1.0000 | Freq: Two times a day (BID) | NASAL | Status: AC | PRN
  Administered 2021-03-10: 1 via NASAL
  Filled 2021-03-10 (×2): qty 30

## 2021-03-10 NOTE — Progress Notes (Signed)
Initial Nutrition Assessment  DOCUMENTATION CODES:   Underweight  INTERVENTION:   -Ensure Enlive po BID, each supplement provides 350 kcal and 20 grams of protein -MVI with minerals daily  NUTRITION DIAGNOSIS:   Inadequate oral intake related to decreased appetite as evidenced by meal completion < 50%.  GOAL:   Patient will meet greater than or equal to 90% of their needs  MONITOR:   PO intake,Supplement acceptance,Labs,Weight trends,Skin,I & O's  REASON FOR ASSESSMENT:   Consult Assessment of nutrition requirement/status  ASSESSMENT:   71 year old F with PMH of COPD not on oxygen, CVA, chronic back pain, HTN and depression presenting to ED for evaluation of progressive left wrist and forearm pain, redness and swelling after dog bite on 03/06/2021.  She underwent I&D by hand surgery, Dr. Erskine Speed on 03/09/2021.  Currently on IV Rocephin and clindamycin.  Surgical culture pending.  Pt admitted with dog bites of lt wrist, forearm, and hand.   3/10- s/p  OPERATIVE PROCEDURE: Incision and drainage of left distal forearm, wrist deep abscess Incision and drainage left hand subcutaneous palmar infection Incision and drainage left dorsal forearm subcutaneous infection.  Reviewed I/O's: +340 ml x 24 hours and +975 ml since admission  Pt sleeping soundly at time of visit. She did not respond to voice or touch.   Pt with poor appetite; noted meal completion 0-60%.   Reviewed wt hx; pt with distant history of weight loss, however, has been stable over the past few years. Pt is also small framed. Given decreased appetite and history of COPD, pt is at high risk for malnutrition, however, unable to identify at this time. Pt would greatly benefit from addition of oral nutrition supplements.   Medications reviewed and include melatonin.   Labs reviewed: Na: 130.   NUTRITION - FOCUSED PHYSICAL EXAM:  Flowsheet Row Most Recent Value  Orbital Region No depletion  Upper Arm Region Mild  depletion  Thoracic and Lumbar Region No depletion  Buccal Region No depletion  Temple Region No depletion  Clavicle Bone Region Mild depletion  Clavicle and Acromion Bone Region Mild depletion  Scapular Bone Region Mild depletion  Dorsal Hand Mild depletion  Patellar Region No depletion  Anterior Thigh Region No depletion  Posterior Calf Region No depletion  Edema (RD Assessment) Mild  Hair Reviewed  Eyes Reviewed  Mouth Reviewed  Skin Reviewed  Nails Reviewed       Diet Order:   Diet Order            Diet regular Room service appropriate? Yes; Fluid consistency: Thin  Diet effective now                 EDUCATION NEEDS:   No education needs have been identified at this time  Skin:  Skin Assessment: Skin Integrity Issues: Skin Integrity Issues:: Incisions Incisions: closed lt arm and lt wrist  Last BM:  03/08/21  Height:   Ht Readings from Last 1 Encounters:  03/09/21 5\' 3"  (1.6 m)    Weight:   Wt Readings from Last 1 Encounters:  03/09/21 46.3 kg    Ideal Body Weight:  52.3 kg  BMI:  Body mass index is 18.07 kg/m.  Estimated Nutritional Needs:   Kcal:  1600-1800  Protein:  80-95 grams  Fluid:  > 1.6 L    Loistine Chance, RD, LDN, [place] Registered Dietitian II Certified Diabetes Care and Education Specialist Please refer to [facility] for RD and/or RD on-call/weekend/after hours pager

## 2021-03-10 NOTE — Plan of Care (Signed)

## 2021-03-10 NOTE — Progress Notes (Signed)
PROGRESS NOTE  Marie Lyons OYD:[id number] DOB: 11-01-1950   PCP: Maury Dus, MD  Patient is from: home. Lives independently at baseline.  DOA: 03/08/2021 LOS: 0  Chief complaints: Left wrist/forearm pain, redness and swelling  Brief Narrative / Interim history: 71 year old F with PMH of COPD not on oxygen, CVA, chronic back pain, HTN and depression presenting to ED for evaluation of progressive left wrist and forearm pain, redness and swelling after dog bite on 03/06/2021.  She underwent I&D by hand surgery, Dr. Erskine Speed on 03/09/2021.  Currently on IV Rocephin and clindamycin.  Surgical culture pending.  Subjective: Seen and examined earlier this morning.  No major events overnight of this morning.  Still with significant pain in the left hand.  Also swelling in her fingers and proximally about her elbow.  Denies numbness or tingling. Barely moves her fingers but she says this better.  Also reports some nausea but no emesis, diarrhea or abdominal pain.  Denies chest pain or dyspnea.  She is very anxious about going home  Objective: Vitals:   03/09/21 2159 03/10/21 0009 03/10/21 0400 03/10/21 0809  BP: (!) 147/78 100/79 109/64 (!) 109/58  Pulse: 90 82 91 94  Resp: 16 16 18 17   Temp: 98.1 F (36.7 C) 98 F (36.7 C) 98.9 F (37.2 C) 98.4 F (36.9 C)  TempSrc: Oral Oral Oral Oral  SpO2: 100% 93% 96% 96%  Weight:      Height:        Intake/Output Summary (Last 24 hours) at 03/10/2021 1356 Last data filed at 03/10/2021 1214 Gross per 24 hour  Intake 1189.67 ml  Output 10 ml  Net 1179.67 ml   Filed Weights   03/08/21 1504 03/09/21 1759  Weight: 46.3 kg 46.3 kg    Examination:  GENERAL: No apparent distress.  Nontoxic. HEENT: MMM.  Vision and hearing grossly intact.  NECK: Supple.  No apparent JVD.  RESP: On RA.  No IWOB.  Fair aeration bilaterally. CVS:  RRR. Heart sounds normal.  ABD/GI/GU: BS+. Abd soft, NTND.  MSK/EXT:  Moves extremities.  Splint over left arm.  Swelling in her digits and proximally about her elbow.  Neurovascular intact. Minimally moves her fingers. SKIN: no apparent skin lesion or wound but the swelling as above. NEURO: Awake, alert and oriented appropriately.  No apparent focal neuro deficit. PSYCH: Calm. Normal affect.   Procedures:  03/09/2021-incision and drainage of left distal forearm, wrist deep abscess, left hand  Microbiology summarized: -COVID-19 PCR nonreactive. -Blood cultures NGTD. -Surgical tissue culture pending.  Assessment & Plan: Infected dog bite involving the left hand, wrist and forearm -S/p incision and drainage by Dr. Apolonio Schneiders on 03/09/2021. -Culture data as above. -Continue Rocephin and clindamycin pending surgical tissue cultures. -On as needed IV fentanyl, Norco, Flexeril and Tylenol for pain. -Outpatient follow-up with Dr. Jori Moll on Monday -Continue splint 24/7 until outpatient follow-up -Ice and elevate  Chronic COPD: Stable -As needed albuterol  Essential hypertension: Normotensive -Continue home metoprolol  History of CVA: No residual deficits. -Continue statin  Anxiety and depression: Stable -Continue home Lexapro   Chronic pain syndrome -Pain meds as above.  Underweight: Body mass index is 18.07 kg/m.  -Consult dietitian       DVT prophylaxis:  SCD's Start: 03/09/21 2152 SCDs Start: 03/08/21 2121  Code Status: Full code Family Communication: Patient and/or RN. Available if any question.  Level of care: Med-Surg Status is: Observation  The patient will require care spanning > 2 midnights and should be moved  to inpatient because: Ongoing active pain requiring inpatient pain management, IV treatments appropriate due to intensity of illness or inability to take PO and Inpatient level of care appropriate due to severity of illness  Dispo: The patient is from: Home              Anticipated d/c is to: Home              Patient currently is not medically stable to d/c.    Difficult to place patient No       Consultants:  Hand Surgery   Sch Meds:  Scheduled Meds: . cyclobenzaprine  10 mg Oral QHS  . escitalopram  20 mg Oral Daily  . melatonin  10 mg Oral QHS  . metoprolol succinate  25 mg Oral QHS  . simvastatin  20 mg Oral QHS   Continuous Infusions: . sodium chloride Stopped (03/08/21 1847)  . cefTRIAXone (ROCEPHIN)  IV 2 g (03/09/21 2305)  . clindamycin (CLEOCIN) IV 600 mg (03/10/21 1333)  . lactated ringers 10 mL/hr at 03/09/21 2003   PRN Meds:.sodium chloride, acetaminophen **OR** acetaminophen, albuterol, fentaNYL (SUBLIMAZE) injection, HYDROcodone-acetaminophen, ondansetron **OR** ondansetron (ZOFRAN) IV, oxymetazoline, polyethylene glycol  Antimicrobials: Anti-infectives (From admission, onward)   Start     Dose/Rate Route Frequency Ordered Stop   03/09/21 0115  clindamycin (CLEOCIN) IVPB 600 mg        600 mg 100 mL/hr over 30 Minutes Intravenous Every 8 hours 03/08/21 2106     03/08/21 2200  cefTRIAXone (ROCEPHIN) 2 g in sodium chloride 0.9 % 100 mL IVPB        2 g 200 mL/hr over 30 Minutes Intravenous Every 24 hours 03/08/21 2105     03/08/21 1715  clindamycin (CLEOCIN) IVPB 600 mg        600 mg 100 mL/hr over 30 Minutes Intravenous  Once 03/08/21 1707 03/08/21 1847   03/08/21 1600  doxycycline (VIBRAMYCIN) 100 mg in sodium chloride 0.9 % 250 mL IVPB  Status:  Discontinued        100 mg 125 mL/hr over 120 Minutes Intravenous  Once 03/08/21 1556 03/08/21 2054       I have personally reviewed the following labs and images: CBC: Recent Labs  Lab 03/08/21 1528 03/09/21 0255  WBC 12.7* 11.9*  NEUTROABS 10.6* 9.5*  HGB 12.0 10.9*  HCT 37.9 34.2*  MCV 97.9 96.6  PLT 195 189   BMP &GFR Recent Labs  Lab 03/08/21 1528 03/09/21 0255 03/10/21 0302  NA 136 134* 130*  K 3.4* 3.5 4.0  CL 98 99 96*  CO2 30 27 23   GLUCOSE 92 110* 117*  BUN 11 7* 9  CREATININE 0.66 0.62 0.65  CALCIUM 9.1 8.4* 8.0*  MG  --  1.7 1.6*    Estimated Creatinine Clearance: 46.5 mL/min (by C-G formula based on SCr of 0.65 mg/dL). Liver & Pancreas: Recent Labs  Lab 03/08/21 1528  AST 15  ALT 11  ALKPHOS 52  BILITOT 0.3  PROT 7.1  ALBUMIN 3.9   No results for input(s): LIPASE, AMYLASE in the last 168 hours. No results for input(s): AMMONIA in the last 168 hours. Diabetic: No results for input(s): HGBA1C in the last 72 hours. No results for input(s): GLUCAP in the last 168 hours. Cardiac Enzymes: No results for input(s): CKTOTAL, CKMB, CKMBINDEX, TROPONINI in the last 168 hours. No results for input(s): PROBNP in the last 8760 hours. Coagulation Profile: No results for input(s): INR, PROTIME in the last 168  hours. Thyroid Function Tests: No results for input(s): TSH, T4TOTAL, FREET4, T3FREE, THYROIDAB in the last 72 hours. Lipid Profile: No results for input(s): CHOL, HDL, LDLCALC, TRIG, CHOLHDL, LDLDIRECT in the last 72 hours. Anemia Panel: No results for input(s): VITAMINB12, FOLATE, FERRITIN, TIBC, IRON, RETICCTPCT in the last 72 hours. Urine analysis:    Component Value Date/Time   COLORURINE COLORLESS (A) 12/16/2020 1814   APPEARANCEUR CLEAR 12/16/2020 1814   LABSPEC 1.003 (L) 12/16/2020 1814   PHURINE 7.0 12/16/2020 1814   GLUCOSEU NEGATIVE 12/16/2020 1814   HGBUR NEGATIVE 12/16/2020 1814   BILIRUBINUR NEGATIVE 12/16/2020 1814   KETONESUR NEGATIVE 12/16/2020 1814   PROTEINUR NEGATIVE 12/16/2020 1814   UROBILINOGEN 1.0 09/09/2012 2015   NITRITE NEGATIVE 12/16/2020 1814   LEUKOCYTESUR NEGATIVE 12/16/2020 1814   Sepsis Labs: Invalid input(s): PROCALCITONIN, [place]  Microbiology: Recent Results (from the past 240 hour(s))  Culture, blood (routine x 2)     Status: None (Preliminary result)   Collection Time: 03/08/21  3:28 PM   Specimen: BLOOD RIGHT FOREARM  Result Value Ref Range Status   Specimen Description BLOOD RIGHT FOREARM  Final   Special Requests   Final    BOTTLES DRAWN AEROBIC AND  ANAEROBIC Blood Culture adequate volume   Culture   Final    NO GROWTH < 24 HOURS Performed at [facility], [address], [place], [place] [address]    Report Status PENDING  Incomplete  Culture, blood (routine x 2)     Status: None (Preliminary result)   Collection Time: 03/08/21  3:31 PM   Specimen: BLOOD  Result Value Ref Range Status   Specimen Description BLOOD LEFT ANTECUBITAL  Final   Special Requests   Final    BOTTLES DRAWN AEROBIC AND ANAEROBIC Blood Culture adequate volume   Culture   Final    NO GROWTH < 24 HOURS Performed at [facility], [place] [address] [place],  [address]    Report Status PENDING  Incomplete  Resp Panel by RT-PCR (Flu A&B, Covid) Nasopharyngeal Swab     Status: None   Collection Time: 03/08/21  4:42 PM   Specimen: Nasopharyngeal Swab; Nasopharyngeal(NP) swabs in vial transport medium  Result Value Ref Range Status   SARS Coronavirus 2 by RT PCR NEGATIVE NEGATIVE Final    Comment: (NOTE) SARS-CoV-2 target nucleic acids are NOT DETECTED.  The SARS-CoV-2 RNA is generally detectable in upper respiratory specimens during the acute phase of infection. The lowest concentration of SARS-CoV-2 viral copies this assay can detect is 138 copies/mL. A negative result does not preclude SARS-Cov-2 infection and should not be used as the sole basis for treatment or other patient management decisions. A negative result may occur with  improper specimen collection/handling, submission of specimen other than nasopharyngeal swab, presence of viral mutation(s) within the areas targeted by this assay, and inadequate number of viral copies(<138 copies/mL). A negative result must be combined with clinical observations, patient history, and epidemiological information. The expected result is Negative.  Fact Sheet for Patients:  EntrepreneurPulse.com.au  Fact Sheet for Healthcare Providers:   IncredibleEmployment.be  This test is no t yet approved or cleared by the [place] FDA and  has been authorized for detection and/or diagnosis of SARS-CoV-2 by FDA under an Emergency Use Authorization (EUA). This EUA will remain  in effect (meaning this test can be used) for the duration of the COVID-19 declaration under Section 564(b)(1) of the Act, 21 U.S.C.section 360bbb-3(b)(1), unless the authorization is  terminated  or revoked sooner.       Influenza A by PCR NEGATIVE NEGATIVE Final   Influenza B by PCR NEGATIVE NEGATIVE Final    Comment: (NOTE) The Xpert Xpress SARS-CoV-2/FLU/RSV plus assay is intended as an aid in the diagnosis of influenza from Nasopharyngeal swab specimens and should not be used as a sole basis for treatment. Nasal washings and aspirates are unacceptable for Xpert Xpress SARS-CoV-2/FLU/RSV testing.  Fact Sheet for Patients: EntrepreneurPulse.com.au  Fact Sheet for Healthcare Providers: IncredibleEmployment.be  This test is not yet approved or cleared by the [place] FDA and has been authorized for detection and/or diagnosis of SARS-CoV-2 by FDA under an Emergency Use Authorization (EUA). This EUA will remain in effect (meaning this test can be used) for the duration of the COVID-19 declaration under Section 564(b)(1) of the Act, 21 U.S.C. section 360bbb-3(b)(1), unless the authorization is terminated or revoked.      Radiology Studies: No results found.    Riley Hallum T. [place]  If 7PM-7AM, please contact night-coverage [email or web address] 03/10/2021, 1:56 PM

## 2021-03-11 ENCOUNTER — Observation Stay (HOSPITAL_COMMUNITY): Payer: Medicare HMO

## 2021-03-11 DIAGNOSIS — L089 Local infection of the skin and subcutaneous tissue, unspecified: Secondary | ICD-10-CM | POA: Diagnosis not present

## 2021-03-11 DIAGNOSIS — J42 Unspecified chronic bronchitis: Secondary | ICD-10-CM | POA: Diagnosis not present

## 2021-03-11 DIAGNOSIS — M25461 Effusion, right knee: Secondary | ICD-10-CM | POA: Diagnosis not present

## 2021-03-11 DIAGNOSIS — M25561 Pain in right knee: Secondary | ICD-10-CM

## 2021-03-11 DIAGNOSIS — M7121 Synovial cyst of popliteal space [Baker], right knee: Secondary | ICD-10-CM | POA: Diagnosis not present

## 2021-03-11 DIAGNOSIS — W540XXA Bitten by dog, initial encounter: Secondary | ICD-10-CM | POA: Diagnosis not present

## 2021-03-11 DIAGNOSIS — S61552A Open bite of left wrist, initial encounter: Secondary | ICD-10-CM | POA: Diagnosis not present

## 2021-03-11 DIAGNOSIS — M009 Pyogenic arthritis, unspecified: Secondary | ICD-10-CM | POA: Diagnosis not present

## 2021-03-11 DIAGNOSIS — S61452A Open bite of left hand, initial encounter: Secondary | ICD-10-CM | POA: Diagnosis not present

## 2021-03-11 DIAGNOSIS — D509 Iron deficiency anemia, unspecified: Secondary | ICD-10-CM

## 2021-03-11 LAB — CBC WITH DIFFERENTIAL/PLATELET
Abs Immature Granulocytes: 0.01 10*3/uL (ref 0.00–0.07)
Basophils Absolute: 0 10*3/uL (ref 0.0–0.1)
Basophils Relative: 0 %
Eosinophils Absolute: 0.1 10*3/uL (ref 0.0–0.5)
Eosinophils Relative: 2 %
HCT: 30.9 % — ABNORMAL LOW (ref 36.0–46.0)
Hemoglobin: 10.4 g/dL — ABNORMAL LOW (ref 12.0–15.0)
Immature Granulocytes: 0 %
Lymphocytes Relative: 17 %
Lymphs Abs: 1.2 10*3/uL (ref 0.7–4.0)
MCH: 32 pg (ref 26.0–34.0)
MCHC: 33.7 g/dL (ref 30.0–36.0)
MCV: 95.1 fL (ref 80.0–100.0)
Monocytes Absolute: 0.6 10*3/uL (ref 0.1–1.0)
Monocytes Relative: 9 %
Neutro Abs: 5 10*3/uL (ref 1.7–7.7)
Neutrophils Relative %: 72 %
Platelets: 183 10*3/uL (ref 150–400)
RBC: 3.25 MIL/uL — ABNORMAL LOW (ref 3.87–5.11)
RDW: 13.9 % (ref 11.5–15.5)
WBC: 7 10*3/uL (ref 4.0–10.5)
nRBC: 0 % (ref 0.0–0.2)

## 2021-03-11 LAB — RENAL FUNCTION PANEL
Albumin: 2.3 g/dL — ABNORMAL LOW (ref 3.5–5.0)
Anion gap: 8 (ref 5–15)
BUN: 8 mg/dL (ref 8–23)
CO2: 28 mmol/L (ref 22–32)
Calcium: 8.1 mg/dL — ABNORMAL LOW (ref 8.9–10.3)
Chloride: 100 mmol/L (ref 98–111)
Creatinine, Ser: 0.64 mg/dL (ref 0.44–1.00)
GFR, Estimated: >60 mL/min (ref >60)
Glucose, Bld: 107 mg/dL — ABNORMAL HIGH (ref 70–99)
Phosphorus: 3.7 mg/dL (ref 2.5–4.6)
Potassium: 3.2 mmol/L — ABNORMAL LOW (ref 3.5–5.1)
Sodium: 136 mmol/L (ref 135–145)

## 2021-03-11 LAB — RETICULOCYTES
Immature Retic Fract: 19.8 % — ABNORMAL HIGH (ref 2.3–15.9)
RBC.: 3.3 MIL/uL — ABNORMAL LOW (ref 3.87–5.11)
Retic Count, Absolute: 59.4 10*3/uL (ref 19.0–186.0)
Retic Ct Pct: 1.8 % (ref 0.4–3.1)

## 2021-03-11 LAB — SYNOVIAL CELL COUNT + DIFF, W/ CRYSTALS
Crystals, Fluid: NONE SEEN
Eosinophils-Synovial: 0 % (ref 0–1)
Lymphocytes-Synovial Fld: 0 % (ref 0–20)
Monocyte-Macrophage-Synovial Fluid: 1 % — ABNORMAL LOW (ref 50–90)
Neutrophil, Synovial: 99 % — ABNORMAL HIGH (ref 0–25)
WBC, Synovial: 10950 /mm3 — ABNORMAL HIGH (ref 0–200)

## 2021-03-11 LAB — FOLATE: Folate: 3.4 ng/mL — ABNORMAL LOW (ref >5.9)

## 2021-03-11 LAB — IRON AND TIBC
Iron: 9 ug/dL — ABNORMAL LOW (ref 28–170)
Saturation Ratios: 5 % — ABNORMAL LOW (ref 10.4–31.8)
TIBC: 197 ug/dL — ABNORMAL LOW (ref 250–450)
UIBC: 188 ug/dL

## 2021-03-11 LAB — MAGNESIUM: Magnesium: 2 mg/dL (ref 1.7–2.4)

## 2021-03-11 LAB — FERRITIN: Ferritin: 104 ng/mL (ref 11–307)

## 2021-03-11 LAB — VITAMIN B12: Vitamin B-12: 180 pg/mL (ref 180–914)

## 2021-03-11 MED ORDER — SODIUM CHLORIDE 0.9 % IV SOLN
510.0000 mg | Freq: Once | INTRAVENOUS | Status: AC
  Administered 2021-03-11: 510 mg via INTRAVENOUS
  Filled 2021-03-11: qty 17

## 2021-03-11 MED ORDER — FOLIC ACID 1 MG PO TABS
1.0000 mg | ORAL_TABLET | Freq: Every day | ORAL | Status: DC
  Administered 2021-03-11 – 2021-03-15 (×4): 1 mg via ORAL
  Filled 2021-03-11 (×5): qty 1

## 2021-03-11 MED ORDER — CEFDINIR 300 MG PO CAPS: 300.0000 mg | ORAL_CAPSULE | Freq: Two times a day (BID) | ORAL | 0 refills | Status: DC

## 2021-03-11 MED ORDER — ADULT MULTIVITAMIN W/MINERALS CH: 1.0000 | ORAL_TABLET | Freq: Every day | ORAL | 1 refills | Status: AC

## 2021-03-11 MED ORDER — SODIUM CHLORIDE 0.9 % IV SOLN
25.0000 mg | Freq: Once | INTRAVENOUS | Status: AC
  Administered 2021-03-11: 25 mg via INTRAVENOUS
  Filled 2021-03-11: qty 2

## 2021-03-11 MED ORDER — FOLIC ACID 1 MG PO TABS: 1.0000 mg | ORAL_TABLET | Freq: Every day | ORAL | 1 refills | Status: DC

## 2021-03-11 MED ORDER — METRONIDAZOLE 500 MG PO TABS: 500.0000 mg | ORAL_TABLET | Freq: Three times a day (TID) | ORAL | 0 refills | Status: DC

## 2021-03-11 MED ORDER — POTASSIUM CHLORIDE CRYS ER 20 MEQ PO TBCR
40.0000 meq | EXTENDED_RELEASE_TABLET | ORAL | Status: AC
  Administered 2021-03-11 (×2): 40 meq via ORAL
  Filled 2021-03-11 (×3): qty 2

## 2021-03-11 MED ORDER — LIDOCAINE HCL 1 % IJ SOLN
5.0000 mL | Freq: Once | INTRAMUSCULAR | Status: DC
  Filled 2021-03-11: qty 5

## 2021-03-11 NOTE — Progress Notes (Signed)
White count 10,000 and knee fluid.  No crystals. Unlikely cultures will grow based on patient being on IV antibiotics Patient may or may not need arthroscopic washout of the knee.  For now will reevaluate her clinically for recurrence of effusion within the next 1 to 2 days.  Could consider arthroscopic washout tomorrow if effusion recurs.  10,000 white count is not a large amount typically for infectious arthropathy but she has been on antibiotics.

## 2021-03-11 NOTE — Evaluation (Signed)
Occupational Therapy Evaluation Patient Details Name: Marie Lyons MRN: [id number] DOB: 1950-08-27 Today's Date: 03/11/2021    History of Present Illness 71 y.o. female with medical history significant for COPD, depression, history of CVA, chronic back pain, and hypertension, presenting with dog bite with left wrist and forearm.  She underwent:Incision and drainage of left distal forearm, wrist deep abscess.  Incision and drainage left hand subcutaneous palmar infection.  Incision and drainage left dorsal forearm subcutaneous infection.   Clinical Impression   Patient admitted for the above diagnosis and procedure.  PTA she lived alone and needed no assist with mobility, self care, bills, meds, still drives.  Very independent.  Her son will be available for intermittent assist.  Deficits are listed below.  Out of bed, more thorough ADL, and in room mobility need to be completed.  Patient currently have new onset of R knee redness and swelling, and [place] is planned.  Patient was able to sit up, more higher in the bed laterally, and lie back down with no assist.  HEP established with sheet left, and education regarding elevation for edema management was reviewed.  OT will follow in the acute setting to ensure a safe transition home.  Outpatient OT at the direction of MD is recommended.      Follow Up Recommendations  Follow surgeon's recommendation for DC plan and follow-up therapies;Supervision - Intermittent;Outpatient OT    Equipment Recommendations  None recommended by OT    Recommendations for Other Services       Precautions / Restrictions        Mobility Bed Mobility Overal bed mobility: Modified Independent                  Transfers                      Balance Overall balance assessment: Needs assistance Sitting-balance support: No upper extremity supported Sitting balance-Leahy Scale: Good         Standing balance comment: NT                            ADL either performed or assessed with clinical judgement   ADL Overall ADL's : Needs assistance/impaired Eating/Feeding: Set up;Sitting;Bed level   Grooming: Set up;Sitting               Lower Body Dressing: Set up;Sitting/lateral leans                 General ADL Comments: deferred OOB due to R knee pain     Vision Patient Visual Report: No change from baseline       Perception     Praxis      Pertinent Vitals/Pain Pain Assessment: 0-10 Pain Score: 6  Pain Descriptors / Indicators: Grimacing;Guarding;Sharp;Operative site guarding Pain Intervention(s): Monitored during session     Hand Dominance Right   Extremity/Trunk Assessment Upper Extremity Assessment Upper Extremity Assessment: LUE deficits/detail LUE Deficits / Details: splinted to L wrist and wrapped in ACE bandage. LUE: Unable to fully assess due to immobilization LUE Sensation: WNL LUE Coordination: decreased fine motor   Lower Extremity Assessment Lower Extremity Assessment: Defer to PT evaluation   Cervical / Trunk Assessment Cervical / Trunk Assessment: Normal   Communication Communication Communication: No difficulties   Cognition Arousal/Alertness: Awake/alert Behavior During Therapy: WFL for tasks assessed/performed Overall Cognitive Status: Within Functional Limits for tasks assessed  General Comments       Exercises General Exercises - Upper Extremity Shoulder Flexion: AROM;Left Shoulder Extension: AROM;Left Elbow Flexion: AROM;Left Elbow Extension: AROM;Left Digit Composite Flexion: PROM;AROM;AAROM;Self ROM;Left Composite Extension: PROM;AROM;AAROM;Self ROM;Left   Shoulder Instructions      Home Living Family/patient expects to be discharged to:: Private residence Living Arrangements: Alone Available Help at Discharge: Family;Available PRN/intermittently Type of Home: Apartment Home Access: Level  entry     Home Layout: One level     Bathroom Shower/Tub: Teacher, early years/pre: Standard     Home Equipment: Grab bars - tub/shower          Prior Functioning/Environment Level of Independence: Independent                 OT Problem List: Decreased range of motion;Pain      OT Treatment/Interventions: Self-care/ADL training;Therapeutic exercise;Balance training;Therapeutic activities    OT Goals(Current goals can be found in the care plan section) Acute Rehab OT Goals Patient Stated Goal: Get back home to my dog OT Goal Formulation: With patient Time For Goal Achievement: 03/25/21 Potential to Achieve Goals: Good  OT Frequency: Min 2X/week   Barriers to D/C:    none noted       Co-evaluation              AM-PAC OT "6 Clicks" Daily Activity     Outcome Measure Help from another person eating meals?: A Little Help from another person taking care of personal grooming?: A Little Help from another person toileting, which includes using toliet, bedpan, or urinal?: A Little Help from another person bathing (including washing, rinsing, drying)?: A Little Help from another person to put on and taking off regular upper body clothing?: A Little Help from another person to put on and taking off regular lower body clothing?: A Little 6 Click Score: 18   End of Session Nurse Communication: Mobility status  Activity Tolerance: Patient tolerated treatment well Patient left: in bed;with call bell/phone within reach  OT Visit Diagnosis: Pain Pain - Right/Left: Left Pain - part of body: Arm                Time: 2878-6767 OT Time Calculation (min): 32 min Charges:  OT General Charges $OT Visit: 1 Visit OT Evaluation $OT Eval Moderate Complexity: 1 Mod OT Treatments $Therapeutic Exercise: 8-22 mins  03/11/2021  Rich, OTR/L  Acute Rehabilitation Services  Office:  [phone]   Metta Clines 03/11/2021, 4:15 PM

## 2021-03-11 NOTE — Consult Note (Signed)
Reason for Consult: Right knee effusion Referring Physician: Dr. Arnaldo Natal Spragg is an 71 y.o. female.  HPI: Patient presents for evaluation of right knee effusion.  Patient underwent I&D for left hand infection sustained from a dog bite 5 days ago.  Underwent I&D earlier this week.  Was doing well when she developed right knee effusion and pain with weightbearing today.  Denies any history of injury or trauma to the right knee.  She has been on IV antibiotics.  Presents now for further management.  Past Medical History:  Diagnosis Date  . Cardiac arrhythmia   . Chronic back pain   . High cholesterol   . Migraine   . Scoliosis   . Stroke [facility])     Past Surgical History:  Procedure Laterality Date  . ABDOMINAL HYSTERECTOMY    . BUNIONECTOMY    . I & D EXTREMITY Left 03/09/2021   Procedure: IRRIGATION AND DEBRIDEMENT LEFT  WRIST AND LEFT HAND;  Surgeon: Iran Planas, MD;  Location: [place];  Service: Orthopedics;  Laterality: Left;  . ROTATOR CUFF REPAIR      Family History  Problem Relation Age of Onset  . CAD Father   . Stroke Neg Hx     Social History:  reports that she has been smoking cigarettes. She has a 12.50 pack-year smoking history. She has never used smokeless tobacco. She reports that she does not drink alcohol and does not use drugs.  Allergies:  Allergies  Allergen Reactions  . Ciprofloxacin Shortness Of Breath    Denies this allergy on 09/23/16 (??)  . Keflex [Cephalexin] Anaphylaxis  . Penicillins Anaphylaxis    Has patient had a PCN reaction causing immediate rash, facial/tongue/throat swelling, SOB or lightheadedness with hypotension: Yes Has patient had a PCN reaction causing severe rash involving mucus membranes or skin necrosis: Yes Has patient had a PCN reaction that required hospitalization Yes Has patient had a PCN reaction occurring within the last 10 years: No If all of the above answers are "NO", then may proceed with Cephalosporin use.   .  Aspirin Other (See Comments)    Aggravates ulcer  . Darvon [Propoxyphene Hcl] Other (See Comments)    Wheezing  . Ibuprofen Other (See Comments)    Wheezing  . Imitrex [Sumatriptan] Other (See Comments)    Contraindicated because pt had a stroke  . Meperidine Other (See Comments)    Unknown reaction (per patient)  . Monosodium Glutamate Other (See Comments)    Migraine   . Nitrates, Organic Other (See Comments)    Migraine   . Nsaids Other (See Comments)    Aggravates ulcer  . Sulfa Antibiotics Other (See Comments)    Unknown reaction (per patient)    Medications: I have reviewed the patient's current medications.  Results for orders placed or performed during the hospital encounter of 03/08/21 (from the past 48 hour(s))  Basic metabolic panel     Status: Abnormal   Collection Time: 03/10/21  3:02 AM  Result Value Ref Range   Sodium 130 (L) 135 - 145 mmol/L   Potassium 4.0 3.5 - 5.1 mmol/L   Chloride 96 (L) 98 - 111 mmol/L   CO2 23 22 - 32 mmol/L   Glucose, Bld 117 (H) 70 - 99 mg/dL    Comment: Glucose reference range applies only to samples taken after fasting for at least 8 hours.   BUN 9 8 - 23 mg/dL   Creatinine, Ser 0.65 0.44 - 1.00 mg/dL  Calcium 8.0 (L) 8.9 - 10.3 mg/dL   GFR, Estimated >60 >60 mL/min    Comment: (NOTE) Calculated using the CKD-EPI Creatinine Equation (2021)    Anion gap 11 5 - 15    Comment: Performed at [facility], [place] [address], [place], [place] [address]  Magnesium     Status: Abnormal   Collection Time: 03/10/21  3:02 AM  Result Value Ref Range   Magnesium 1.6 (L) 1.7 - 2.4 mg/dL    Comment: Performed at [facility] [address] [place], [place] [address]  CBC     Status: Abnormal   Collection Time: 03/10/21  2:41 PM  Result Value Ref Range   WBC 11.8 (H) 4.0 - 10.5 K/uL   RBC 3.43 (L) 3.87 - 5.11 MIL/uL   Hemoglobin 10.7 (L) 12.0 - 15.0 g/dL   HCT 33.9 (L) 36.0 - 46.0 %   MCV 98.8 80.0 - 100.0 fL   MCH 31.2 26.0  - 34.0 pg   MCHC 31.6 30.0 - 36.0 g/dL   RDW 14.2 11.5 - 15.5 %   Platelets 175 150 - 400 K/uL   nRBC 0.0 0.0 - 0.2 %    Comment: Performed at [facility], [place] [address] [place], [place] [address]  Differential     Status: Abnormal   Collection Time: 03/10/21  2:41 PM  Result Value Ref Range   Neutrophils Relative % 83 %   Neutro Abs 9.7 (H) 1.7 - 7.7 K/uL   Lymphocytes Relative 9 %   Lymphs Abs 1.0 0.7 - 4.0 K/uL   Monocytes Relative 8 %   Monocytes Absolute 0.9 0.1 - 1.0 K/uL   Eosinophils Relative 0 %   Eosinophils Absolute 0.0 0.0 - 0.5 K/uL   Basophils Relative 0 %   Basophils Absolute 0.0 0.0 - 0.1 K/uL   Immature Granulocytes 0 %   Abs Immature Granulocytes 0.03 0.00 - 0.07 K/uL    Comment: Performed at [facility] [address], [place], [place] [address]  CBC with Differential/Platelet     Status: Abnormal   Collection Time: 03/11/21  2:51 AM  Result Value Ref Range   WBC 7.0 4.0 - 10.5 K/uL   RBC 3.25 (L) 3.87 - 5.11 MIL/uL   Hemoglobin 10.4 (L) 12.0 - 15.0 g/dL   HCT 30.9 (L) 36.0 - 46.0 %   MCV 95.1 80.0 - 100.0 fL   MCH 32.0 26.0 - 34.0 pg   MCHC 33.7 30.0 - 36.0 g/dL   RDW 13.9 11.5 - 15.5 %   Platelets 183 150 - 400 K/uL   nRBC 0.0 0.0 - 0.2 %   Neutrophils Relative % 72 %   Neutro Abs 5.0 1.7 - 7.7 K/uL   Lymphocytes Relative 17 %   Lymphs Abs 1.2 0.7 - 4.0 K/uL   Monocytes Relative 9 %   Monocytes Absolute 0.6 0.1 - 1.0 K/uL   Eosinophils Relative 2 %   Eosinophils Absolute 0.1 0.0 - 0.5 K/uL   Basophils Relative 0 %   Basophils Absolute 0.0 0.0 - 0.1 K/uL   Immature Granulocytes 0 %   Abs Immature Granulocytes 0.01 0.00 - 0.07 K/uL    Comment: Performed at [facility], [address] [place], [place] [address]  Magnesium     Status: None   Collection Time: 03/11/21  2:51 AM  Result Value Ref Range   Magnesium 2.0 1.7 - 2.4 mg/dL  Comment: Performed at [facility], [place] [address], [place], [place] [address]   Renal function panel     Status: Abnormal   Collection Time: 03/11/21  2:51 AM  Result Value Ref Range   Sodium 136 135 - 145 mmol/L   Potassium 3.2 (L) 3.5 - 5.1 mmol/L   Chloride 100 98 - 111 mmol/L   CO2 28 22 - 32 mmol/L   Glucose, Bld 107 (H) 70 - 99 mg/dL    Comment: Glucose reference range applies only to samples taken after fasting for at least 8 hours.   BUN 8 8 - 23 mg/dL   Creatinine, Ser 0.64 0.44 - 1.00 mg/dL   Calcium 8.1 (L) 8.9 - 10.3 mg/dL   Phosphorus 3.7 2.5 - 4.6 mg/dL   Albumin 2.3 (L) 3.5 - 5.0 g/dL   GFR, Estimated >60 >60 mL/min    Comment: (NOTE) Calculated using the CKD-EPI Creatinine Equation (2021)    Anion gap 8 5 - 15    Comment: Performed at [facility] [address] [place], [place] [address]  Vitamin B12     Status: None   Collection Time: 03/11/21  2:51 AM  Result Value Ref Range   Vitamin B-12 180 180 - 914 pg/mL    Comment: (NOTE) This assay is not validated for testing neonatal or myeloproliferative syndrome specimens for Vitamin B12 levels. Performed at [facility], [place] [address] [place], [place] [address]   Folate     Status: Abnormal   Collection Time: 03/11/21  2:51 AM  Result Value Ref Range   Folate 3.4 (L) >5.9 ng/mL    Comment: Performed at [facility], [place] [address], [place], [place] [address]  Iron and TIBC     Status: Abnormal   Collection Time: 03/11/21  2:51 AM  Result Value Ref Range   Iron 9 (L) 28 - 170 ug/dL   TIBC 197 (L) 250 - 450 ug/dL   Saturation Ratios 5 (L) 10.4 - 31.8 %   UIBC 188 ug/dL    Comment: Performed at [facility], [place] [address], [place], [place] [address]  Ferritin     Status: None   Collection Time: 03/11/21  2:51 AM  Result Value Ref Range   Ferritin 104 11 - 307 ng/mL    Comment: Performed at [facility], [place] [address], [place], [place] [address]  Reticulocytes     Status: Abnormal   Collection Time: 03/11/21  2:51 AM  Result Value Ref Range    Retic Ct Pct 1.8 0.4 - 3.1 %   RBC. 3.30 (L) 3.87 - 5.11 MIL/uL   Retic Count, Absolute 59.4 19.0 - 186.0 K/uL   Immature Retic Fract 19.8 (H) 2.3 - 15.9 %    Comment: Performed at [facility] [address], [place], [place] [address]  Synovial cell count + diff, w/ crystals     Status: Abnormal   Collection Time: 03/11/21  6:18 PM  Result Value Ref Range   Color, Synovial YELLOW (A) YELLOW   Appearance-Synovial CLOUDY (A) CLEAR   Crystals, Fluid NONE SEEN    WBC, Synovial 10,950 (H) 0 - 200 /cu mm   Neutrophil, Synovial 99 (H) 0 - 25 %   Lymphocytes-Synovial Fld 0 0 - 20 %   Monocyte-Macrophage-Synovial Fluid 1 (L) 50 - 90 %   Eosinophils-Synovial 0 0 - 1 %    Comment: Performed at [facility], [place] [address] [place], [place] [address]    [place] LT LOWER EXTREM LTD SOFT TISSUE NON VASCULAR  Result Date: 03/11/2021 CLINICAL DATA:  Right lower thigh pain and mass. EXAM: ULTRASOUND right LOWER EXTREMITY LIMITED TECHNIQUE: Ultrasound examination of the lower extremity soft tissues was performed in the area of clinical concern. COMPARISON:  Radiograph 03/11/2021 FINDINGS: There is a large complex suprapatellar knee joint effusion as demonstrated on the radiographs. There is also a moderate-sized Baker's cyst. IMPRESSION: Large complex suprapatellar knee joint effusion and moderate-sized Baker's cyst. MRI may be helpful to evaluate for internal derangement. Electronically Signed   By: Marijo Sanes M.D.   On: 03/11/2021 14:38   DG KNEE 3 VIEW RIGHT  Result Date: 03/11/2021 CLINICAL DATA:  Pain and swelling EXAM: RIGHT KNEE -4 VIEW COMPARISON:  None. FINDINGS: Frontal, lateral, and bilateral oblique views were obtained. There is no fracture or dislocation. There is a sizable joint effusion. No appreciable joint space narrowing or erosion. There is atherosclerotic calcification in the distal superficial femoral and popliteal arteries. IMPRESSION: Sizable knee joint effusion. No  fracture or dislocation. No joint space narrowing or erosion. Atherosclerotic arterial vascular calcification noted. Electronically Signed   By: Lowella Grip III M.D.   On: 03/11/2021 12:16    Review of Systems  Musculoskeletal: Positive for arthralgias.  All other systems reviewed and are negative.  Blood pressure 126/75, pulse 84, temperature 99.3 F (37.4 C), temperature source Oral, resp. rate 15, height 5\' 3"  (1.6 m), weight 46.3 kg, SpO2 100 %. Physical Exam Vitals reviewed.  HENT:     Head: Normocephalic.     Nose: Nose normal.     Mouth/Throat:     Mouth: Mucous membranes are moist.  Eyes:     Pupils: Pupils are equal, round, and reactive to light.  Cardiovascular:     Rate and Rhythm: Normal rate.     Pulses: Normal pulses.  Pulmonary:     Effort: Pulmonary effort is normal.  Abdominal:     General: Abdomen is flat.  Musculoskeletal:     Cervical back: Normal range of motion.  Skin:    General: Skin is warm.     Capillary Refill: Capillary refill takes less than 2 seconds.  Neurological:     General: No focal deficit present.     Mental Status: She is alert.  Psychiatric:        Mood and Affect: Mood normal.   Examination of the right knee demonstrates moderate effusion.  Pain with range of motion.  Pedal pulses palpable.  No proximal lymphadenopathy.  Knee is warm compared to the left-hand side.  Extensor mechanism is intact.  Collateral and cruciate ligaments are stable.  Assessment/Plan: Impression is right knee effusion following dog bite injury sustained 5 days ago.  Patient had that treated but developed swelling in the right knee and difficulty weightbearing.  Aspiration of the knee is performed today.  Labs pending.  If it appears infected she will require arthroscopic I&D.  N.p.o. tonight until labs come in.  Landry Dyke Blinda Turek 03/11/2021, 10:17 PM

## 2021-03-11 NOTE — Progress Notes (Signed)
Patient found to have expiratory wheezing. Primary RN notified. Patient given incentive spirometer and teach-back method was used. Teach-back method used for therapeutic cough as well.

## 2021-03-11 NOTE — Progress Notes (Signed)
PT Cancellation Note  Patient Details Name: Marie Lyons MRN: [id number] DOB: 1950-08-31   Cancelled Treatment:    Reason Eval/Treat Not Completed: Medical issues which prohibited therapy (pt with swelling right know and just underwent US.  Deferred PT today until results.)   Shanna Cisco 03/11/2021, 3:32 PM

## 2021-03-11 NOTE — Progress Notes (Signed)
Patient found to have expiratory wheezing. Primary RN notified. Patient given incentive spirometer and teach-back method was used. Teach-back method used for therapeutic cough as well.   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

## 2021-03-11 NOTE — Progress Notes (Signed)
PROGRESS NOTE  Marie Lyons NWG:[id number] DOB: [birth year]   PCP: Maury Dus, MD  Patient is from: home. Lives independently at baseline.  DOA: 03/08/2021 LOS: 0  Chief complaints: Left wrist/forearm pain, redness and swelling  Brief Narrative / Interim history: 71 year old F with PMH of COPD not on oxygen, CVA, chronic back pain, HTN and depression presenting to ED for evaluation of progressive left wrist and forearm pain, redness and swelling after dog bite on 03/06/2021.  She underwent I&D by hand surgery, Dr. Erskine Speed on 03/09/2021.  Currently on IV Rocephin and clindamycin.  Surgical culture pending.  Patient was to discharge home on oral antibiotic but at right knee swelling with pain.  X-ray and ultrasound with joint effusion.  Orthopedic surgery consulted for aspiration.   Subjective: Seen and examined earlier this morning and this afternoon.  She has had acute right knee swelling with pain and tenderness.  No prior history of this.  No trauma or fall.   Objective: Vitals:   03/10/21 1555 03/10/21 1952 03/11/21 0435 03/11/21 0733  BP: 116/71 104/65 123/64 115/68  Pulse: 82 98 79 88  Resp: 16 16 16  (!) 21  Temp: 98.7 F (37.1 C) 98 F (36.7 C) 98.7 F (37.1 C) 99.7 F (37.6 C)  TempSrc: Oral  Oral Oral  SpO2: 95% 92% 96% 93%  Weight:      Height:        Intake/Output Summary (Last 24 hours) at 03/11/2021 1512 Last data filed at 03/11/2021 0900 Gross per 24 hour  Intake 360 ml  Output --  Net 360 ml   Filed Weights   03/08/21 1504 03/09/21 1759  Weight: 46.3 kg 46.3 kg    Examination:  GENERAL: No apparent distress.  Nontoxic. HEENT: MMM.  Vision and hearing grossly intact.  NECK: Supple.  No apparent JVD.  RESP: On RA.  No IWOB.  Fair aeration bilaterally. CVS:  RRR. Heart sounds normal.  ABD/GI/GU: BS+. Abd soft, NTND.  MSK/EXT: Swelling and effusion above right knee.  Tender laterally.  No overlying skin erythema.  Limited range of motion due to pain.   Splint over left arm.  Some swelling in her digits but able to move.  Neurovascular intact. SKIN: no apparent skin lesion or wound NEURO: Awake, alert and oriented appropriately.  No apparent focal neuro deficit. PSYCH: Calm. Normal affect.   Procedures:  03/09/2021-incision and drainage of left distal forearm, wrist deep abscess, left hand  Microbiology summarized: -COVID-19 PCR nonreactive. -Blood cultures NGTD. -Surgical tissue culture pending.  Assessment & Plan: Infected dog bite involving the left hand, wrist and forearm -S/p incision and drainage by Dr. Apolonio Schneiders on 03/09/2021. -Continue Rocephin and clindamycin pending surgical tissue cultures. -Follow surgical culture -On as needed IV fentanyl, Norco, Flexeril and Tylenol for pain. -Outpatient follow-up with Dr. Jori Moll on Monday -Continue splint 24/7 until outpatient follow-up -Ice and elevate  Right knee swelling/effusion-x-ray without osseous abnormalities but effusion.  Ultrasound with large complex suprapatellar knee joint effusion and moderate-sized Baker's cyst.  No trauma or fall. -Orthopedic surgery consulted for aspiration. -Will get PT/OT after aspiration.  Chronic COPD: Stable -As needed albuterol  Essential hypertension: Normotensive -Continue home metoprolol  Hypokalemia: K3.2. -P.o. K-Dur 40 mEq x 2  Iron deficiency anemia: H&H stable.  Iron sat 5%. Folic acid deficiency -IV Feraheme x1 -P.o. folic acid  History of CVA: No residual deficits. -Continue statin  Anxiety and depression: Stable -Continue home Lexapro   Chronic pain syndrome -Pain meds as above.  Underweight:  Body mass index is 18.07 kg/m. Nutrition Problem: Inadequate oral intake Etiology: decreased appetite Signs/Symptoms: meal completion < 50% Interventions: Ensure Enlive (each supplement provides 350kcal and 20 grams of protein),MVI   DVT prophylaxis:  SCD's Start: 03/09/21 2152 SCDs Start: 03/08/21 2121  Code Status:  Full code Family Communication: Patient and/or RN. Available if any question.  Level of care: Med-Surg  Status is: Observation  The patient remains OBS appropriate and will d/c before 2 midnights.  Dispo: The patient is from: Home              Anticipated d/c is to: Home              Patient currently is not medically stable to d/c.    Difficult to place patient No    Consultants:  Hand Surgery Orthopedic surgery   Sch Meds:  Scheduled Meds: . cyclobenzaprine  10 mg Oral QHS  . escitalopram  20 mg Oral Daily  . feeding supplement  237 mL Oral BID BM  . folic acid  1 mg Oral Daily  . melatonin  10 mg Oral QHS  . metoprolol succinate  25 mg Oral QHS  . multivitamin with minerals  1 tablet Oral Daily  . simvastatin  20 mg Oral QHS   Continuous Infusions: . sodium chloride Stopped (03/08/21 1847)  . cefTRIAXone (ROCEPHIN)  IV 2 g (03/10/21 2235)  . clindamycin (CLEOCIN) IV 600 mg (03/11/21 1414)  . ferumoxytol    . lactated ringers 10 mL/hr at 03/09/21 2003   PRN Meds:.sodium chloride, acetaminophen **OR** acetaminophen, albuterol, fentaNYL (SUBLIMAZE) injection, HYDROcodone-acetaminophen, ondansetron **OR** ondansetron (ZOFRAN) IV, oxymetazoline, polyethylene glycol  Antimicrobials: Anti-infectives (From admission, onward)   Start     Dose/Rate Route Frequency Ordered Stop   03/11/21 0000  cefdinir (OMNICEF) 300 MG capsule        300 mg Oral 2 times daily 03/11/21 1058     03/11/21 0000  metroNIDAZOLE (FLAGYL) 500 MG tablet        500 mg Oral 3 times daily 03/11/21 1058 03/18/21 2359   03/09/21 0115  clindamycin (CLEOCIN) IVPB 600 mg        600 mg 100 mL/hr over 30 Minutes Intravenous Every 8 hours 03/08/21 2106     03/08/21 2200  cefTRIAXone (ROCEPHIN) 2 g in sodium chloride 0.9 % 100 mL IVPB        2 g 200 mL/hr over 30 Minutes Intravenous Every 24 hours 03/08/21 2105     03/08/21 1715  clindamycin (CLEOCIN) IVPB 600 mg        600 mg 100 mL/hr over 30 Minutes  Intravenous  Once 03/08/21 1707 03/08/21 1847   03/08/21 1600  doxycycline (VIBRAMYCIN) 100 mg in sodium chloride 0.9 % 250 mL IVPB  Status:  Discontinued        100 mg 125 mL/hr over 120 Minutes Intravenous  Once 03/08/21 1556 03/08/21 2054       I have personally reviewed the following labs and images: CBC: Recent Labs  Lab 03/08/21 1528 03/09/21 0255 03/10/21 1441 03/11/21 0251  WBC 12.7* 11.9* 11.8* 7.0  NEUTROABS 10.6* 9.5* 9.7* 5.0  HGB 12.0 10.9* 10.7* 10.4*  HCT 37.9 34.2* 33.9* 30.9*  MCV 97.9 96.6 98.8 95.1  PLT 195 189 175 183   BMP &GFR Recent Labs  Lab 03/08/21 1528 03/09/21 0255 03/10/21 0302 03/11/21 0251  NA 136 134* 130* 136  K 3.4* 3.5 4.0 3.2*  CL 98 99 96* 100  CO2  30 27 23 28   GLUCOSE 92 110* 117* 107*  BUN 11 7* 9 8  CREATININE 0.66 0.62 0.65 0.64  CALCIUM 9.1 8.4* 8.0* 8.1*  MG  --  1.7 1.6* 2.0  PHOS  --   --   --  3.7   Estimated Creatinine Clearance: 46.5 mL/min (by C-G formula based on SCr of 0.64 mg/dL). Liver & Pancreas: Recent Labs  Lab 03/08/21 1528 03/11/21 0251  AST 15  --   ALT 11  --   ALKPHOS 52  --   BILITOT 0.3  --   PROT 7.1  --   ALBUMIN 3.9 2.3*   No results for input(s): LIPASE, AMYLASE in the last 168 hours. No results for input(s): AMMONIA in the last 168 hours. Diabetic: No results for input(s): HGBA1C in the last 72 hours. No results for input(s): GLUCAP in the last 168 hours. Cardiac Enzymes: No results for input(s): CKTOTAL, CKMB, CKMBINDEX, TROPONINI in the last 168 hours. No results for input(s): PROBNP in the last 8760 hours. Coagulation Profile: No results for input(s): INR, PROTIME in the last 168 hours. Thyroid Function Tests: No results for input(s): TSH, T4TOTAL, FREET4, T3FREE, THYROIDAB in the last 72 hours. Lipid Profile: No results for input(s): CHOL, HDL, LDLCALC, TRIG, CHOLHDL, LDLDIRECT in the last 72 hours. Anemia Panel: Recent Labs    03/11/21 0251  VITAMINB12 180  FOLATE 3.4*   FERRITIN 104  TIBC 197*  IRON 9*  RETICCTPCT 1.8   Urine analysis:    Component Value Date/Time   COLORURINE COLORLESS (A) 12/16/2020 1814   APPEARANCEUR CLEAR 12/16/2020 1814   LABSPEC 1.003 (L) 12/16/2020 1814   PHURINE 7.0 12/16/2020 1814   GLUCOSEU NEGATIVE 12/16/2020 1814   HGBUR NEGATIVE 12/16/2020 1814   BILIRUBINUR NEGATIVE 12/16/2020 1814   KETONESUR NEGATIVE 12/16/2020 1814   PROTEINUR NEGATIVE 12/16/2020 1814   UROBILINOGEN 1.0 09/09/2012 2015   NITRITE NEGATIVE 12/16/2020 1814   LEUKOCYTESUR NEGATIVE 12/16/2020 1814   Sepsis Labs: Invalid input(s): PROCALCITONIN, [place]  Microbiology: Recent Results (from the past 240 hour(s))  Culture, blood (routine x 2)     Status: None (Preliminary result)   Collection Time: 03/08/21  3:28 PM   Specimen: BLOOD RIGHT FOREARM  Result Value Ref Range Status   Specimen Description BLOOD RIGHT FOREARM  Final   Special Requests   Final    BOTTLES DRAWN AEROBIC AND ANAEROBIC Blood Culture adequate volume   Culture   Final    NO GROWTH 2 DAYS Performed at [facility], [address] [place], [place] [address]    Report Status PENDING  Incomplete  Culture, blood (routine x 2)     Status: None (Preliminary result)   Collection Time: 03/08/21  3:31 PM   Specimen: BLOOD  Result Value Ref Range Status   Specimen Description BLOOD LEFT ANTECUBITAL  Final   Special Requests   Final    BOTTLES DRAWN AEROBIC AND ANAEROBIC Blood Culture adequate volume   Culture   Final    NO GROWTH 2 DAYS Performed at [facility], [place] [address] [place], [place] [address]    Report Status PENDING  Incomplete  Resp Panel by RT-PCR (Flu A&B, Covid) Nasopharyngeal Swab     Status: None   Collection Time: 03/08/21  4:42 PM   Specimen: Nasopharyngeal Swab; Nasopharyngeal(NP) swabs in vial transport medium  Result Value Ref Range Status   SARS Coronavirus 2 by RT PCR NEGATIVE NEGATIVE Final    Comment: (  NOTE) SARS-CoV-2 target  nucleic acids are NOT DETECTED.  The SARS-CoV-2 RNA is generally detectable in upper respiratory specimens during the acute phase of infection. The lowest concentration of SARS-CoV-2 viral copies this assay can detect is 138 copies/mL. A negative result does not preclude SARS-Cov-2 infection and should not be used as the sole basis for treatment or other patient management decisions. A negative result may occur with  improper specimen collection/handling, submission of specimen other than nasopharyngeal swab, presence of viral mutation(s) within the areas targeted by this assay, and inadequate number of viral copies(<138 copies/mL). A negative result must be combined with clinical observations, patient history, and epidemiological information. The expected result is Negative.  Fact Sheet for Patients:  EntrepreneurPulse.com.au  Fact Sheet for Healthcare Providers:  IncredibleEmployment.be  This test is no t yet approved or cleared by the [place] FDA and  has been authorized for detection and/or diagnosis of SARS-CoV-2 by FDA under an Emergency Use Authorization (EUA). This EUA will remain  in effect (meaning this test can be used) for the duration of the COVID-19 declaration under Section 564(b)(1) of the Act, 21 U.S.C.section 360bbb-3(b)(1), unless the authorization is terminated  or revoked sooner.       Influenza A by PCR NEGATIVE NEGATIVE Final   Influenza B by PCR NEGATIVE NEGATIVE Final    Comment: (NOTE) The Xpert Xpress SARS-CoV-2/FLU/RSV plus assay is intended as an aid in the diagnosis of influenza from Nasopharyngeal swab specimens and should not be used as a sole basis for treatment. Nasal washings and aspirates are unacceptable for Xpert Xpress SARS-CoV-2/FLU/RSV testing.  Fact Sheet for Patients: EntrepreneurPulse.com.au  Fact Sheet for Healthcare  Providers: IncredibleEmployment.be  This test is not yet approved or cleared by the [place] FDA and has been authorized for detection and/or diagnosis of SARS-CoV-2 by FDA under an Emergency Use Authorization (EUA). This EUA will remain in effect (meaning this test can be used) for the duration of the COVID-19 declaration under Section 564(b)(1) of the Act, 21 U.S.C. section 360bbb-3(b)(1), unless the authorization is terminated or revoked.    Aerobic/Anaerobic Culture w Gram Stain (surgical/deep wound)     Status: None (Preliminary result)   Collection Time: 03/09/21  9:02 PM   Specimen: Other Source; Tissue  Result Value Ref Range Status   Specimen Description WOUND LEFT WRIST SPEC A  Final   Special Requests LEFT WRIST DOG BITE  Final   Gram Stain   Final    FEW WBC PRESENT,BOTH PMN AND MONONUCLEAR ABUNDANT GRAM POSITIVE COCCI ABUNDANT GRAM NEGATIVE RODS    Culture   Final    CULTURE REINCUBATED FOR BETTER GROWTH Performed at [facility], [place] [address], [place], [place] [address]    Report Status PENDING  Incomplete    Radiology Studies: [place] LT LOWER EXTREM LTD SOFT TISSUE NON VASCULAR  Result Date: 03/11/2021 CLINICAL DATA:  Right lower thigh pain and mass. EXAM: ULTRASOUND right LOWER EXTREMITY LIMITED TECHNIQUE: Ultrasound examination of the lower extremity soft tissues was performed in the area of clinical concern. COMPARISON:  Radiograph 03/11/2021 FINDINGS: There is a large complex suprapatellar knee joint effusion as demonstrated on the radiographs. There is also a moderate-sized Baker's cyst. IMPRESSION: Large complex suprapatellar knee joint effusion and moderate-sized Baker's cyst. MRI may be helpful to evaluate for internal derangement. Electronically Signed   By: Marijo Sanes M.D.   On: 03/11/2021 14:38   DG KNEE 3 VIEW RIGHT  Result Date: 03/11/2021 CLINICAL DATA:  Pain  and swelling EXAM: RIGHT KNEE -4 VIEW COMPARISON:  None.  FINDINGS: Frontal, lateral, and bilateral oblique views were obtained. There is no fracture or dislocation. There is a sizable joint effusion. No appreciable joint space narrowing or erosion. There is atherosclerotic calcification in the distal superficial femoral and popliteal arteries. IMPRESSION: Sizable knee joint effusion. No fracture or dislocation. No joint space narrowing or erosion. Atherosclerotic arterial vascular calcification noted. Electronically Signed   By: Lowella Grip III M.D.   On: 03/11/2021 12:16      [place] T. [place]  If 7PM-7AM, please contact night-coverage [email or web address] 03/11/2021, 3:12 PM

## 2021-03-12 ENCOUNTER — Encounter (HOSPITAL_COMMUNITY): Payer: Medicare HMO

## 2021-03-12 ENCOUNTER — Encounter (HOSPITAL_COMMUNITY)
Admission: EM | Disposition: A | Discharge status: Home Health | Payer: Self-pay | Source: Home / Self Care | Attending: Student

## 2021-03-12 ENCOUNTER — Observation Stay (HOSPITAL_COMMUNITY): Payer: Medicare HMO | Admitting: Certified Registered Nurse Anesthetist

## 2021-03-12 DIAGNOSIS — E785 Hyperlipidemia, unspecified: Secondary | ICD-10-CM | POA: Diagnosis present

## 2021-03-12 DIAGNOSIS — R636 Underweight: Secondary | ICD-10-CM | POA: Diagnosis present

## 2021-03-12 DIAGNOSIS — W540XXA Bitten by dog, initial encounter: Secondary | ICD-10-CM | POA: Diagnosis present

## 2021-03-12 DIAGNOSIS — E7800 Pure hypercholesterolemia, unspecified: Secondary | ICD-10-CM | POA: Diagnosis present

## 2021-03-12 DIAGNOSIS — S61552A Open bite of left wrist, initial encounter: Secondary | ICD-10-CM | POA: Diagnosis not present

## 2021-03-12 DIAGNOSIS — M009 Pyogenic arthritis, unspecified: Secondary | ICD-10-CM | POA: Diagnosis present

## 2021-03-12 DIAGNOSIS — L089 Local infection of the skin and subcutaneous tissue, unspecified: Secondary | ICD-10-CM | POA: Diagnosis not present

## 2021-03-12 DIAGNOSIS — L02519 Cutaneous abscess of unspecified hand: Secondary | ICD-10-CM | POA: Diagnosis present

## 2021-03-12 DIAGNOSIS — E876 Hypokalemia: Secondary | ICD-10-CM | POA: Diagnosis not present

## 2021-03-12 DIAGNOSIS — S61552D Open bite of left wrist, subsequent encounter: Secondary | ICD-10-CM | POA: Diagnosis not present

## 2021-03-12 DIAGNOSIS — F32A Depression, unspecified: Secondary | ICD-10-CM | POA: Diagnosis not present

## 2021-03-12 DIAGNOSIS — J449 Chronic obstructive pulmonary disease, unspecified: Secondary | ICD-10-CM | POA: Diagnosis not present

## 2021-03-12 DIAGNOSIS — Z23 Encounter for immunization: Secondary | ICD-10-CM | POA: Diagnosis present

## 2021-03-12 DIAGNOSIS — F17210 Nicotine dependence, cigarettes, uncomplicated: Secondary | ICD-10-CM | POA: Diagnosis present

## 2021-03-12 DIAGNOSIS — M7121 Synovial cyst of popliteal space [Baker], right knee: Secondary | ICD-10-CM | POA: Diagnosis present

## 2021-03-12 DIAGNOSIS — M00832 Arthritis due to other bacteria, left wrist: Secondary | ICD-10-CM | POA: Diagnosis not present

## 2021-03-12 DIAGNOSIS — Z20822 Contact with and (suspected) exposure to covid-19: Secondary | ICD-10-CM | POA: Diagnosis present

## 2021-03-12 DIAGNOSIS — R062 Wheezing: Secondary | ICD-10-CM | POA: Diagnosis present

## 2021-03-12 DIAGNOSIS — Y9289 Other specified places as the place of occurrence of the external cause: Secondary | ICD-10-CM | POA: Diagnosis not present

## 2021-03-12 DIAGNOSIS — F419 Anxiety disorder, unspecified: Secondary | ICD-10-CM | POA: Diagnosis present

## 2021-03-12 DIAGNOSIS — I10 Essential (primary) hypertension: Secondary | ICD-10-CM | POA: Diagnosis not present

## 2021-03-12 DIAGNOSIS — A280 Pasteurellosis: Secondary | ICD-10-CM | POA: Diagnosis present

## 2021-03-12 DIAGNOSIS — Z882 Allergy status to sulfonamides status: Secondary | ICD-10-CM | POA: Diagnosis not present

## 2021-03-12 DIAGNOSIS — E538 Deficiency of other specified B group vitamins: Secondary | ICD-10-CM | POA: Diagnosis present

## 2021-03-12 DIAGNOSIS — S61452A Open bite of left hand, initial encounter: Secondary | ICD-10-CM | POA: Diagnosis not present

## 2021-03-12 DIAGNOSIS — D529 Folate deficiency anemia, unspecified: Secondary | ICD-10-CM

## 2021-03-12 DIAGNOSIS — M419 Scoliosis, unspecified: Secondary | ICD-10-CM | POA: Diagnosis present

## 2021-03-12 DIAGNOSIS — E871 Hypo-osmolality and hyponatremia: Secondary | ICD-10-CM | POA: Diagnosis not present

## 2021-03-12 DIAGNOSIS — Z880 Allergy status to penicillin: Secondary | ICD-10-CM | POA: Diagnosis not present

## 2021-03-12 DIAGNOSIS — W540XXD Bitten by dog, subsequent encounter: Secondary | ICD-10-CM | POA: Diagnosis not present

## 2021-03-12 DIAGNOSIS — M25461 Effusion, right knee: Secondary | ICD-10-CM | POA: Diagnosis not present

## 2021-03-12 DIAGNOSIS — Z681 Body mass index (BMI) 19 or less, adult: Secondary | ICD-10-CM | POA: Diagnosis not present

## 2021-03-12 DIAGNOSIS — D509 Iron deficiency anemia, unspecified: Secondary | ICD-10-CM | POA: Diagnosis present

## 2021-03-12 DIAGNOSIS — Z8673 Personal history of transient ischemic attack (TIA), and cerebral infarction without residual deficits: Secondary | ICD-10-CM | POA: Diagnosis not present

## 2021-03-12 DIAGNOSIS — G894 Chronic pain syndrome: Secondary | ICD-10-CM | POA: Diagnosis present

## 2021-03-12 DIAGNOSIS — J42 Unspecified chronic bronchitis: Secondary | ICD-10-CM | POA: Diagnosis not present

## 2021-03-12 HISTORY — PX: KNEE ARTHROSCOPY WITH MEDIAL MENISECTOMY: SHX5651

## 2021-03-12 LAB — CBC
HCT: 34.8 % — ABNORMAL LOW (ref 36.0–46.0)
Hemoglobin: 11.6 g/dL — ABNORMAL LOW (ref 12.0–15.0)
MCH: 32 pg (ref 26.0–34.0)
MCHC: 33.3 g/dL (ref 30.0–36.0)
MCV: 96.1 fL (ref 80.0–100.0)
Platelets: 191 10*3/uL (ref 150–400)
RBC: 3.62 MIL/uL — ABNORMAL LOW (ref 3.87–5.11)
RDW: 14 % (ref 11.5–15.5)
WBC: 7.8 10*3/uL (ref 4.0–10.5)
nRBC: 0 % (ref 0.0–0.2)

## 2021-03-12 LAB — RENAL FUNCTION PANEL
Albumin: 2.4 g/dL — ABNORMAL LOW (ref 3.5–5.0)
Anion gap: 6 (ref 5–15)
BUN: <5 mg/dL — ABNORMAL LOW (ref 8–23)
CO2: 29 mmol/L (ref 22–32)
Calcium: 8.5 mg/dL — ABNORMAL LOW (ref 8.9–10.3)
Chloride: 99 mmol/L (ref 98–111)
Creatinine, Ser: 0.54 mg/dL (ref 0.44–1.00)
GFR, Estimated: >60 mL/min (ref >60)
Glucose, Bld: 85 mg/dL (ref 70–99)
Phosphorus: 3.5 mg/dL (ref 2.5–4.6)
Potassium: 4.6 mmol/L (ref 3.5–5.1)
Sodium: 134 mmol/L — ABNORMAL LOW (ref 135–145)

## 2021-03-12 LAB — SURGICAL PCR SCREEN
MRSA, PCR: NEGATIVE
Staphylococcus aureus: NEGATIVE

## 2021-03-12 LAB — URIC ACID: Uric Acid, Serum: 1.3 mg/dL — ABNORMAL LOW (ref 2.5–7.1)

## 2021-03-12 LAB — MAGNESIUM: Magnesium: 2.1 mg/dL (ref 1.7–2.4)

## 2021-03-12 SURGERY — ARTHROSCOPY, KNEE, WITH MEDIAL MENISCECTOMY
Anesthesia: General | Site: Knee | Laterality: Right

## 2021-03-12 MED ORDER — FENTANYL CITRATE (PF) 100 MCG/2ML IJ SOLN
INTRAMUSCULAR | Status: AC
  Filled 2021-03-12: qty 2

## 2021-03-12 MED ORDER — METOCLOPRAMIDE HCL 5 MG PO TABS
5.0000 mg | ORAL_TABLET | Freq: Three times a day (TID) | ORAL | Status: DC | PRN
Start: 2021-03-12 — End: 2021-03-15

## 2021-03-12 MED ORDER — CHLORHEXIDINE GLUCONATE 0.12 % MT SOLN
OROMUCOSAL | Status: AC
  Administered 2021-03-12: 15 mL via OROMUCOSAL
  Filled 2021-03-12: qty 15

## 2021-03-12 MED ORDER — ONDANSETRON HCL 4 MG/2ML IJ SOLN: 4.0000 mg | Freq: Four times a day (QID) | INTRAMUSCULAR | Status: DC | PRN

## 2021-03-12 MED ORDER — BUPIVACAINE-EPINEPHRINE (PF) 0.25% -1:200000 IJ SOLN
INTRAMUSCULAR | Status: AC
  Filled 2021-03-12: qty 20

## 2021-03-12 MED ORDER — CLONIDINE HCL (ANALGESIA) 100 MCG/ML EP SOLN
EPIDURAL | Status: AC
  Filled 2021-03-12: qty 10

## 2021-03-12 MED ORDER — ONDANSETRON HCL 4 MG/2ML IJ SOLN: 4.0000 mg | Freq: Once | INTRAMUSCULAR | Status: DC | PRN

## 2021-03-12 MED ORDER — FENTANYL CITRATE (PF) 100 MCG/2ML IJ SOLN
25.0000 ug | INTRAMUSCULAR | Status: DC | PRN
  Administered 2021-03-12: 25 ug via INTRAVENOUS
  Administered 2021-03-12: 50 ug via INTRAVENOUS
  Administered 2021-03-12: 25 ug via INTRAVENOUS

## 2021-03-12 MED ORDER — MORPHINE SULFATE (PF) 4 MG/ML IV SOLN
INTRAVENOUS | Status: DC | PRN
  Administered 2021-03-12: 8 mg

## 2021-03-12 MED ORDER — SODIUM CHLORIDE 0.9 % IR SOLN
Status: DC | PRN
  Administered 2021-03-12 (×3): 3000 mL

## 2021-03-12 MED ORDER — MORPHINE SULFATE (PF) 4 MG/ML IV SOLN
INTRAVENOUS | Status: AC
  Filled 2021-03-12: qty 1

## 2021-03-12 MED ORDER — PHENYLEPHRINE HCL-NACL 10-0.9 MG/250ML-% IV SOLN
INTRAVENOUS | Status: DC | PRN
  Administered 2021-03-12: 25 ug/min via INTRAVENOUS

## 2021-03-12 MED ORDER — LIDOCAINE 2% (20 MG/ML) 5 ML SYRINGE
INTRAMUSCULAR | Status: DC | PRN
  Administered 2021-03-12: 50 mg via INTRAVENOUS

## 2021-03-12 MED ORDER — DEXAMETHASONE SODIUM PHOSPHATE 10 MG/ML IJ SOLN
INTRAMUSCULAR | Status: DC | PRN
  Administered 2021-03-12: 10 mg via INTRAVENOUS

## 2021-03-12 MED ORDER — PROPOFOL 10 MG/ML IV BOLUS
INTRAVENOUS | Status: DC | PRN
  Administered 2021-03-12: 120 mg via INTRAVENOUS

## 2021-03-12 MED ORDER — CHLORHEXIDINE GLUCONATE 4 % EX LIQD: 60.0000 mL | Freq: Once | CUTANEOUS | Status: DC

## 2021-03-12 MED ORDER — FENTANYL CITRATE (PF) 250 MCG/5ML IJ SOLN
INTRAMUSCULAR | Status: DC | PRN
  Administered 2021-03-12: 50 ug via INTRAVENOUS
  Administered 2021-03-12 (×2): 25 ug via INTRAVENOUS

## 2021-03-12 MED ORDER — EPHEDRINE SULFATE 50 MG/ML IJ SOLN
INTRAMUSCULAR | Status: DC | PRN
  Administered 2021-03-12 (×2): 10 mg via INTRAVENOUS

## 2021-03-12 MED ORDER — VANCOMYCIN HCL 500 MG IV SOLR
INTRAVENOUS | Status: AC
  Filled 2021-03-12: qty 500

## 2021-03-12 MED ORDER — VANCOMYCIN HCL 500 MG IV SOLR
INTRAVENOUS | Status: DC | PRN
  Administered 2021-03-12: 500 mg via TOPICAL

## 2021-03-12 MED ORDER — METOCLOPRAMIDE HCL 5 MG/ML IJ SOLN: 5.0000 mg | Freq: Three times a day (TID) | INTRAMUSCULAR | Status: DC | PRN

## 2021-03-12 MED ORDER — EPINEPHRINE PF 1 MG/ML IJ SOLN
INTRAMUSCULAR | Status: AC
  Filled 2021-03-12: qty 2

## 2021-03-12 MED ORDER — GENTAMICIN SULFATE 40 MG/ML IJ SOLN
INTRAMUSCULAR | Status: AC
  Filled 2021-03-12: qty 2

## 2021-03-12 MED ORDER — ONDANSETRON HCL 4 MG/2ML IJ SOLN
INTRAMUSCULAR | Status: DC | PRN
  Administered 2021-03-12: 4 mg via INTRAVENOUS

## 2021-03-12 MED ORDER — FENTANYL CITRATE (PF) 250 MCG/5ML IJ SOLN
INTRAMUSCULAR | Status: AC
  Filled 2021-03-12: qty 5

## 2021-03-12 MED ORDER — CHLORHEXIDINE GLUCONATE 0.12 % MT SOLN: 15.0000 mL | Freq: Once | OROMUCOSAL | Status: AC

## 2021-03-12 MED ORDER — PHENYLEPHRINE HCL (PRESSORS) 10 MG/ML IV SOLN
INTRAVENOUS | Status: DC | PRN
  Administered 2021-03-12: 120 ug via INTRAVENOUS
  Administered 2021-03-12: 80 ug via INTRAVENOUS

## 2021-03-12 MED ORDER — VANCOMYCIN HCL 1000 MG IV SOLR
INTRAVENOUS | Status: AC
  Filled 2021-03-12: qty 1000

## 2021-03-12 MED ORDER — POVIDONE-IODINE 10 % EX SWAB: 2.0000 "application " | Freq: Once | CUTANEOUS | Status: DC

## 2021-03-12 MED ORDER — GENTAMICIN SULFATE 40 MG/ML IJ SOLN
INTRAMUSCULAR | Status: DC | PRN
  Administered 2021-03-12 (×2): 80 mg

## 2021-03-12 MED ORDER — BUPIVACAINE-EPINEPHRINE (PF) 0.25% -1:200000 IJ SOLN
INTRAMUSCULAR | Status: AC
  Filled 2021-03-12: qty 10

## 2021-03-12 MED ORDER — ONDANSETRON HCL 4 MG PO TABS: 4.0000 mg | ORAL_TABLET | Freq: Four times a day (QID) | ORAL | Status: DC | PRN

## 2021-03-12 MED ORDER — EPINEPHRINE PF 1 MG/ML IJ SOLN
INTRAMUSCULAR | Status: DC | PRN
  Administered 2021-03-12 (×2): 1 mg

## 2021-03-12 MED ORDER — METRONIDAZOLE 500 MG PO TABS
500.0000 mg | ORAL_TABLET | Freq: Three times a day (TID) | ORAL | Status: AC
  Administered 2021-03-12 – 2021-03-14 (×7): 500 mg via ORAL
  Filled 2021-03-12 (×7): qty 1

## 2021-03-12 MED ORDER — BUPIVACAINE-EPINEPHRINE (PF) 0.25% -1:200000 IJ SOLN
INTRAMUSCULAR | Status: DC | PRN
  Administered 2021-03-12: 20 mL

## 2021-03-12 MED ORDER — ORAL CARE MOUTH RINSE: 15.0000 mL | Freq: Once | OROMUCOSAL | Status: AC

## 2021-03-12 MED ORDER — ALBUMIN HUMAN 5 % IV SOLN: INTRAVENOUS | Status: DC | PRN

## 2021-03-12 MED ORDER — CLONIDINE HCL (ANALGESIA) 100 MCG/ML EP SOLN
EPIDURAL | Status: DC | PRN
  Administered 2021-03-12: .5 mL via INTRA_ARTICULAR

## 2021-03-12 MED ORDER — PROPOFOL 10 MG/ML IV BOLUS
INTRAVENOUS | Status: AC
  Filled 2021-03-12: qty 20

## 2021-03-12 MED ORDER — DOCUSATE SODIUM 100 MG PO CAPS
100.0000 mg | ORAL_CAPSULE | Freq: Two times a day (BID) | ORAL | Status: DC
  Administered 2021-03-12 – 2021-03-14 (×3): 100 mg via ORAL
  Filled 2021-03-12 (×4): qty 1

## 2021-03-12 MED ORDER — LACTATED RINGERS IV SOLN: INTRAVENOUS | Status: DC

## 2021-03-12 SURGICAL SUPPLY — 45 items
ALCOHOL 70% 16 OZ (MISCELLANEOUS) ×3 IMPLANT
BLADE CUTTER GATOR 3.5 (BLADE) ×3 IMPLANT
BNDG ELASTIC 6X5.8 VLCR STR LF (GAUZE/BANDAGES/DRESSINGS) ×3 IMPLANT
COVER SURGICAL LIGHT HANDLE (MISCELLANEOUS) ×3 IMPLANT
COVER WAND RF STERILE (DRAPES) ×3 IMPLANT
DRAPE ARTHROSCOPY W/POUCH 114 (DRAPES) ×3 IMPLANT
DRAPE U-SHAPE 47X51 STRL (DRAPES) ×3 IMPLANT
DRSG TEGADERM 2-3/8X2-3/4 SM (GAUZE/BANDAGES/DRESSINGS) ×3 IMPLANT
DRSG TEGADERM 4X4.75 (GAUZE/BANDAGES/DRESSINGS) ×3 IMPLANT
DURAPREP 26ML APPLICATOR (WOUND CARE) ×6 IMPLANT
DW OUTFLOW CASSETTE/TUBE SET (MISCELLANEOUS) ×3 IMPLANT
EVACUATOR 1/8 PVC DRAIN (DRAIN) ×3 IMPLANT
GAUZE SPONGE 4X4 12PLY STRL (GAUZE/BANDAGES/DRESSINGS) ×3 IMPLANT
GAUZE XEROFORM 1X8 LF (GAUZE/BANDAGES/DRESSINGS) ×3 IMPLANT
GLOVE ECLIPSE 7.0 STRL STRAW (GLOVE) ×3 IMPLANT
GLOVE ECLIPSE 8.0 STRL XLNG CF (GLOVE) ×3 IMPLANT
GLOVE SRG 8 PF TXTR STRL LF DI (GLOVE) ×1 IMPLANT
GLOVE SURG UNDER POLY LF SZ7 (GLOVE) ×3 IMPLANT
GLOVE SURG UNDER POLY LF SZ8 (GLOVE) ×2
GOWN STRL REUS W/ TWL XL LVL3 (GOWN DISPOSABLE) ×2 IMPLANT
GOWN STRL REUS W/TWL XL LVL3 (GOWN DISPOSABLE) ×4
KIT BASIN OR (CUSTOM PROCEDURE TRAY) ×3 IMPLANT
KIT STIMULAN  10CC (Orthopedic Implant) ×2 IMPLANT
KIT STIMULAN 10CC (Orthopedic Implant) ×1 IMPLANT
KIT TURNOVER KIT B (KITS) ×3 IMPLANT
MANIFOLD NEPTUNE II (INSTRUMENTS) ×3 IMPLANT
NEEDLE 18GX1X1/2 (RX/OR ONLY) (NEEDLE) ×6 IMPLANT
NEEDLE 22X1 1/2 (OR ONLY) (NEEDLE) ×3 IMPLANT
NS IRRIG 1000ML POUR BTL (IV SOLUTION) ×3 IMPLANT
PACK ARTHROSCOPY DSU (CUSTOM PROCEDURE TRAY) ×3 IMPLANT
PAD ARMBOARD 7.5X6 YLW CONV (MISCELLANEOUS) ×6 IMPLANT
PADDING CAST COTTON 6X4 STRL (CAST SUPPLIES) ×3 IMPLANT
PORT APPOLLO RF 90DEGREE MULTI (SURGICAL WAND) ×6 IMPLANT
SOL PREP POV-IOD 4OZ 10% (MISCELLANEOUS) ×3 IMPLANT
SPONGE LAP 4X18 RFD (DISPOSABLE) ×3 IMPLANT
SUT ETHILON 3 0 PS 1 (SUTURE) ×6 IMPLANT
SYR 20ML ECCENTRIC (SYRINGE) ×3 IMPLANT
SYR 50ML LL SCALE MARK (SYRINGE) ×3 IMPLANT
SYR TB 1ML LUER SLIP (SYRINGE) ×6 IMPLANT
TOWEL GREEN STERILE (TOWEL DISPOSABLE) ×3 IMPLANT
TOWEL GREEN STERILE FF (TOWEL DISPOSABLE) ×3 IMPLANT
TUBE CONNECTING 12'X1/4 (SUCTIONS) ×1
TUBE CONNECTING 12X1/4 (SUCTIONS) ×2 IMPLANT
TUBING ARTHROSCOPY IRRIG 16FT (MISCELLANEOUS) ×6 IMPLANT
WATER STERILE IRR 1000ML POUR (IV SOLUTION) ×3 IMPLANT

## 2021-03-12 NOTE — Progress Notes (Signed)
PROGRESS NOTE  Marie Lyons HYI:[id number] DOB: 06-26-1950   PCP: Maury Dus, MD  Patient is from: home. Lives independently at baseline.  DOA: 03/08/2021 LOS: 0  Chief complaints: Left wrist/forearm pain, redness and swelling  Brief Narrative / Interim history: 71 year old F with PMH of COPD not on oxygen, CVA, chronic back pain, HTN and depression presenting to ED for evaluation of progressive left wrist and forearm pain, redness and swelling after dog bite on 03/06/2021.  She underwent I&D by hand surgery, Dr. Erskine Speed on 03/09/2021.  Currently on IV Rocephin and clindamycin.  Surgical culture pending.  Patient developed acute right knee pain with effusion and difficulty weightbearing on 3/12.  X-ray and US showed a complex right knee effusion.  Underwent needle aspiration by Ortho.  Although fluid study does not suggest infection, concern for septic arthritis remained high as she was on IV antibiotic prior to aspiration.  Patient going for arthroscopic washout today.   Subjective: Seen and examined earlier this morning.  She feels better last night but has recurrence of a swelling and pain in her right knee.  She has some limitation with range of motion.  No other complaints.  Objective: Vitals:   03/11/21 0733 03/11/21 2109 03/12/21 0500 03/12/21 0700  BP: 115/68 126/75 118/68 119/77  Pulse: 88 84 86 76  Resp: (!) 21 15 18 18   Temp: 99.7 F (37.6 C) 99.3 F (37.4 C) 98.9 F (37.2 C) 98.5 F (36.9 C)  TempSrc: Oral Oral Oral Oral  SpO2: 93% 100% 97% 96%  Weight:      Height:        Intake/Output Summary (Last 24 hours) at 03/12/2021 1351 Last data filed at 03/12/2021 0900 Gross per 24 hour  Intake 480 ml  Output --  Net 480 ml   Filed Weights   03/08/21 1504 03/09/21 1759  Weight: 46.3 kg 46.3 kg    Examination:  GENERAL: No apparent distress.  Nontoxic. HEENT: MMM.  Vision and hearing grossly intact.  NECK: Supple.  No apparent JVD.  RESP: On RA.  No IWOB.   Fair aeration bilaterally. CVS:  RRR. Heart sounds normal.  ABD/GI/GU: BS+. Abd soft, NTND.  MSK/EXT: Swelling and effusion of right knee.  Tender laterally.  Limited ROM.  Splint over left arm.  Some swelling in her digits but neurovascular intact. SKIN: no apparent skin lesion or wound NEURO: Awake, alert and oriented appropriately.  No apparent focal neuro deficit. PSYCH: Calm. Normal affect.   Procedures:  03/09/2021-incision and drainage of left distal forearm, wrist deep abscess, left hand 03/11/2021-needle aspiration of right knee  Microbiology summarized: -COVID-19 PCR nonreactive. -Blood cultures NGTD. -Surgical tissue culture of left arm pending.  Gram stain with GPC and GNR -Right knee synovial fluid culture NGTD.  Gram stain negative.  Assessment & Plan: Infected dog bite involving the left hand, wrist and forearm -S/p incision and drainage by Dr. Apolonio Schneiders on 03/09/2021. -Continue Rocephin and clindamycin pending surgical tissue cultures. -Follow surgical culture -On as needed IV fentanyl, Norco, Flexeril and Tylenol for pain. -Plan was to f/up with Dr. Jori Moll on 3/14 but won't be able to discharge. Will notify Dr. Apolonio Schneiders -Continue splint 24/7 until outpatient follow-up -Ice and elevate  Acute right knee effusion-concerning for septic arthritis in the setting of recent dog bite and left arm infection.  No trauma or fall involved. -S/p needle aspiration-fluid study negative for crystals and only 10k WBC but patient was on IV antibiotics -Going to the OR for arthroscopic washout -  ID consulted for guidance on antibiotic  Chronic COPD: Stable -As needed albuterol  Essential hypertension: Normotensive -Continue home metoprolol  Hypokalemia: Resolved.  Mild hyponatremia: Na 134. -Continue monitoring  Iron deficiency anemia: H&H stable.  Iron sat 5%. Folic acid deficiency -IV Feraheme on 2/11 -P.o. folic acid 1 mg daily.  History of CVA: No residual  deficits. -Continue statin  Anxiety and depression: Stable -Continue home Lexapro   Chronic pain syndrome -Pain meds as above.  Underweight: Body mass index is 18.07 kg/m. Nutrition Problem: Inadequate oral intake Etiology: decreased appetite Signs/Symptoms: meal completion < 50% Interventions: Ensure Enlive (each supplement provides 350kcal and 20 grams of protein),MVI   DVT prophylaxis:  SCD's Start: 03/09/21 2152 SCDs Start: 03/08/21 2121  Code Status: Full code Family Communication: Patient and/or RN. Available if any question.  Level of care: Med-Surg  Status is: Observation  The patient will require care spanning > 2 midnights and should be moved to inpatient because: Ongoing diagnostic testing needed not appropriate for outpatient work up, Unsafe d/c plan, IV treatments appropriate due to intensity of illness or inability to take PO and Inpatient level of care appropriate due to severity of illness  Dispo: The patient is from: Home              Anticipated d/c is to: Home              Patient currently is not medically stable to d/c.    Difficult to place patient No    Consultants:  Hand Surgery Orthopedic surgery Infectious disease   Sch Meds:  Scheduled Meds: . chlorhexidine  60 mL Topical Once  . chlorhexidine  15 mL Mouth/Throat Once   Or  . mouth rinse  15 mL Mouth Rinse Once  . chlorhexidine      . [MAR Hold] cyclobenzaprine  10 mg Oral QHS  . [MAR Hold] escitalopram  20 mg Oral Daily  . [MAR Hold] feeding supplement  237 mL Oral BID BM  . [MAR Hold] folic acid  1 mg Oral Daily  . [MAR Hold] lidocaine  5 mL Intradermal Once  . [MAR Hold] melatonin  10 mg Oral QHS  . [MAR Hold] metoprolol succinate  25 mg Oral QHS  . [MAR Hold] multivitamin with minerals  1 tablet Oral Daily  . povidone-iodine  2 application Topical Once  . [MAR Hold] simvastatin  20 mg Oral QHS   Continuous Infusions: . [MAR Hold] sodium chloride Stopped (03/08/21 1847)  .  [MAR Hold] cefTRIAXone (ROCEPHIN)  IV 2 g (03/11/21 2333)  . [MAR Hold] clindamycin (CLEOCIN) IV 600 mg (03/12/21 0535)  . lactated ringers 10 mL/hr at 03/09/21 2003  . lactated ringers     PRN Meds:.[MAR Hold] sodium chloride, [MAR Hold] acetaminophen **OR** [MAR Hold] acetaminophen, [MAR Hold] albuterol, [MAR Hold] fentaNYL (SUBLIMAZE) injection, [MAR Hold] HYDROcodone-acetaminophen, [MAR Hold] ondansetron **OR** [MAR Hold] ondansetron (ZOFRAN) IV, [MAR Hold] oxymetazoline, [MAR Hold] polyethylene glycol  Antimicrobials: Anti-infectives (From admission, onward)   Start     Dose/Rate Route Frequency Ordered Stop   03/11/21 0000  cefdinir (OMNICEF) 300 MG capsule        300 mg Oral 2 times daily 03/11/21 1058     03/11/21 0000  metroNIDAZOLE (FLAGYL) 500 MG tablet        500 mg Oral 3 times daily 03/11/21 1058 03/18/21 2359   03/09/21 0115  [MAR Hold]  clindamycin (CLEOCIN) IVPB 600 mg        (MAR Hold  since Sun 03/12/2021 at 1340.Hold Reason: Transfer to a Procedural area.)   600 mg 100 mL/hr over 30 Minutes Intravenous Every 8 hours 03/08/21 2106     03/08/21 2200  [MAR Hold]  cefTRIAXone (ROCEPHIN) 2 g in sodium chloride 0.9 % 100 mL IVPB        (MAR Hold since Sun 03/12/2021 at 1340.Hold Reason: Transfer to a Procedural area.)   2 g 200 mL/hr over 30 Minutes Intravenous Every 24 hours 03/08/21 2105     03/08/21 1715  clindamycin (CLEOCIN) IVPB 600 mg        600 mg 100 mL/hr over 30 Minutes Intravenous  Once 03/08/21 1707 03/08/21 1847   03/08/21 1600  doxycycline (VIBRAMYCIN) 100 mg in sodium chloride 0.9 % 250 mL IVPB  Status:  Discontinued        100 mg 125 mL/hr over 120 Minutes Intravenous  Once 03/08/21 1556 03/08/21 2054       I have personally reviewed the following labs and images: CBC: Recent Labs  Lab 03/08/21 1528 03/09/21 0255 03/10/21 1441 03/11/21 0251 03/12/21 0305  WBC 12.7* 11.9* 11.8* 7.0 7.8  NEUTROABS 10.6* 9.5* 9.7* 5.0  --   HGB 12.0 10.9* 10.7* 10.4*  11.6*  HCT 37.9 34.2* 33.9* 30.9* 34.8*  MCV 97.9 96.6 98.8 95.1 96.1  PLT 195 189 175 183 191   BMP &GFR Recent Labs  Lab 03/08/21 1528 03/09/21 0255 03/10/21 0302 03/11/21 0251 03/12/21 0305  NA 136 134* 130* 136 134*  K 3.4* 3.5 4.0 3.2* 4.6  CL 98 99 96* 100 99  CO2 30 27 23 28 29   GLUCOSE 92 110* 117* 107* 85  BUN 11 7* 9 8 <5*  CREATININE 0.66 0.62 0.65 0.64 0.54  CALCIUM 9.1 8.4* 8.0* 8.1* 8.5*  MG  --  1.7 1.6* 2.0 2.1  PHOS  --   --   --  3.7 3.5   Estimated Creatinine Clearance: 46.5 mL/min (by C-G formula based on SCr of 0.54 mg/dL). Liver & Pancreas: Recent Labs  Lab 03/08/21 1528 03/11/21 0251 03/12/21 0305  AST 15  --   --   ALT 11  --   --   ALKPHOS 52  --   --   BILITOT 0.3  --   --   PROT 7.1  --   --   ALBUMIN 3.9 2.3* 2.4*   No results for input(s): LIPASE, AMYLASE in the last 168 hours. No results for input(s): AMMONIA in the last 168 hours. Diabetic: No results for input(s): HGBA1C in the last 72 hours. No results for input(s): GLUCAP in the last 168 hours. Cardiac Enzymes: No results for input(s): CKTOTAL, CKMB, CKMBINDEX, TROPONINI in the last 168 hours. No results for input(s): PROBNP in the last 8760 hours. Coagulation Profile: No results for input(s): INR, PROTIME in the last 168 hours. Thyroid Function Tests: No results for input(s): TSH, T4TOTAL, FREET4, T3FREE, THYROIDAB in the last 72 hours. Lipid Profile: No results for input(s): CHOL, HDL, LDLCALC, TRIG, CHOLHDL, LDLDIRECT in the last 72 hours. Anemia Panel: Recent Labs    03/11/21 0251  VITAMINB12 180  FOLATE 3.4*  FERRITIN 104  TIBC 197*  IRON 9*  RETICCTPCT 1.8   Urine analysis:    Component Value Date/Time   COLORURINE COLORLESS (A) 12/16/2020 1814   APPEARANCEUR CLEAR 12/16/2020 1814   LABSPEC 1.003 (L) 12/16/2020 1814   PHURINE 7.0 12/16/2020 1814   GLUCOSEU NEGATIVE 12/16/2020 1814   HGBUR NEGATIVE 12/16/2020 1814  BILIRUBINUR NEGATIVE 12/16/2020 [place] 12/16/2020 1814   PROTEINUR NEGATIVE 12/16/2020 1814   UROBILINOGEN 1.0 09/09/2012 2015   NITRITE NEGATIVE 12/16/2020 1814   LEUKOCYTESUR NEGATIVE 12/16/2020 1814   Sepsis Labs: Invalid input(s): PROCALCITONIN, [place]  Microbiology: Recent Results (from the past 240 hour(s))  Culture, blood (routine x 2)     Status: None (Preliminary result)   Collection Time: 03/08/21  3:28 PM   Specimen: BLOOD RIGHT FOREARM  Result Value Ref Range Status   Specimen Description BLOOD RIGHT FOREARM  Final   Special Requests   Final    BOTTLES DRAWN AEROBIC AND ANAEROBIC Blood Culture adequate volume   Culture   Final    NO GROWTH 3 DAYS Performed at [facility], [address], [place], [place] [address]    Report Status PENDING  Incomplete  Culture, blood (routine x 2)     Status: None (Preliminary result)   Collection Time: 03/08/21  3:31 PM   Specimen: BLOOD  Result Value Ref Range Status   Specimen Description BLOOD LEFT ANTECUBITAL  Final   Special Requests   Final    BOTTLES DRAWN AEROBIC AND ANAEROBIC Blood Culture adequate volume   Culture   Final    NO GROWTH 3 DAYS Performed at [facility], [place] [address], [place], [place] [address]    Report Status PENDING  Incomplete  Resp Panel by RT-PCR (Flu A&B, Covid) Nasopharyngeal Swab     Status: None   Collection Time: 03/08/21  4:42 PM   Specimen: Nasopharyngeal Swab; Nasopharyngeal(NP) swabs in vial transport medium  Result Value Ref Range Status   SARS Coronavirus 2 by RT PCR NEGATIVE NEGATIVE Final    Comment: (NOTE) SARS-CoV-2 target nucleic acids are NOT DETECTED.  The SARS-CoV-2 RNA is generally detectable in upper respiratory specimens during the acute phase of infection. The lowest concentration of SARS-CoV-2 viral copies this assay can detect is 138 copies/mL. A negative result does not preclude SARS-Cov-2 infection and should not be used as the sole basis for treatment or other  patient management decisions. A negative result may occur with  improper specimen collection/handling, submission of specimen other than nasopharyngeal swab, presence of viral mutation(s) within the areas targeted by this assay, and inadequate number of viral copies(<138 copies/mL). A negative result must be combined with clinical observations, patient history, and epidemiological information. The expected result is Negative.  Fact Sheet for Patients:  EntrepreneurPulse.com.au  Fact Sheet for Healthcare Providers:  IncredibleEmployment.be  This test is no t yet approved or cleared by the [place] FDA and  has been authorized for detection and/or diagnosis of SARS-CoV-2 by FDA under an Emergency Use Authorization (EUA). This EUA will remain  in effect (meaning this test can be used) for the duration of the COVID-19 declaration under Section 564(b)(1) of the Act, 21 U.S.C.section 360bbb-3(b)(1), unless the authorization is terminated  or revoked sooner.       Influenza A by PCR NEGATIVE NEGATIVE Final   Influenza B by PCR NEGATIVE NEGATIVE Final    Comment: (NOTE) The Xpert Xpress SARS-CoV-2/FLU/RSV plus assay is intended as an aid in the diagnosis of influenza from Nasopharyngeal swab specimens and should not be used as a sole basis for treatment. Nasal washings and aspirates are unacceptable for Xpert Xpress SARS-CoV-2/FLU/RSV testing.  Fact Sheet for Patients: EntrepreneurPulse.com.au  Fact Sheet for Healthcare Providers: IncredibleEmployment.be  This test is not yet approved or cleared by the [place] and  has been authorized for detection and/or diagnosis of SARS-CoV-2 by FDA under an Emergency Use Authorization (EUA). This EUA will remain in effect (meaning this test can be used) for the duration of the COVID-19 declaration under Section 564(b)(1) of the Act, 21 U.S.C. section  360bbb-3(b)(1), unless the authorization is terminated or revoked.    Aerobic/Anaerobic Culture w Gram Stain (surgical/deep wound)     Status: None (Preliminary result)   Collection Time: 03/09/21  9:02 PM   Specimen: Other Source; Tissue  Result Value Ref Range Status   Specimen Description WOUND LEFT WRIST SPEC A  Final   Special Requests LEFT WRIST DOG BITE  Final   Gram Stain   Final    FEW WBC PRESENT,BOTH PMN AND MONONUCLEAR ABUNDANT GRAM POSITIVE COCCI ABUNDANT GRAM NEGATIVE RODS    Culture   Final    CULTURE REINCUBATED FOR BETTER GROWTH Performed at [facility], [place] [address] [place], [place] [address]    Report Status PENDING  Incomplete  Aerobic/Anaerobic Culture w Gram Stain (surgical/deep wound)     Status: None (Preliminary result)   Collection Time: 03/11/21  5:28 PM   Specimen: Synovium; Synovial Fluid  Result Value Ref Range Status   Specimen Description SYNOVIAL FLUID  Final   Special Requests JOINT KNEE  Final   Gram Stain   Final    ABUNDANT WBC PRESENT, PREDOMINANTLY PMN NO ORGANISMS SEEN    Culture   Final    NO GROWTH < 24 HOURS Performed at [facility], [address] [place], [place] [address]    Report Status PENDING  Incomplete    Radiology Studies: [place] LT LOWER EXTREM LTD SOFT TISSUE NON VASCULAR  Result Date: 03/11/2021 CLINICAL DATA:  Right lower thigh pain and mass. EXAM: ULTRASOUND right LOWER EXTREMITY LIMITED TECHNIQUE: Ultrasound examination of the lower extremity soft tissues was performed in the area of clinical concern. COMPARISON:  Radiograph 03/11/2021 FINDINGS: There is a large complex suprapatellar knee joint effusion as demonstrated on the radiographs. There is also a moderate-sized Baker's cyst. IMPRESSION: Large complex suprapatellar knee joint effusion and moderate-sized Baker's cyst. MRI may be helpful to evaluate for internal derangement. Electronically Signed   By: Marijo Sanes M.D.   On: 03/11/2021 14:38       Marie Lyons T. [place]  If 7PM-7AM, please contact night-coverage [email or web address] 03/12/2021, 1:51 PM

## 2021-03-12 NOTE — Brief Op Note (Signed)
   03/12/2021  3:58 PM  PATIENT:  Marie Lyons  71 y.o. female  PRE-OPERATIVE DIAGNOSIS:  Right Knee Effusion  Infection  POST-OPERATIVE DIAGNOSIS:  Right Knee Effusion  Infection  PROCEDURE:  Procedure(s): KNEE ARTHROSCOPY right with  SURGEON:  Surgeon(s): Meredith Pel, MD  ASSISTANT: none  ANESTHESIA:   general  EBL: 5 ml    Total I/O In: 990 [P.O.:240; I.V.:500; IV Piggyback:250] Out: 10 [Blood:10]  BLOOD ADMINISTERED: none  DRAINS: none   LOCAL MEDICATIONS USED: Marcaine morphine clonidine stimulant beads with vancomycin and gentamicin  SPECIMEN:  No Specimen  COUNTS:  YES  TOURNIQUET:  * No tourniquets in log *  DICTATION: .Other Dictation: Dictation Number [id number]  PLAN OF CARE: Admit to inpatient   PATIENT DISPOSITION:  PACU - hemodynamically stable

## 2021-03-12 NOTE — Anesthesia Preprocedure Evaluation (Signed)
Anesthesia Evaluation  Patient identified by MRN, date of birth, ID band Patient awake    Reviewed: Allergy & Precautions, NPO status , Patient's Chart, lab work & pertinent test results, reviewed documented beta blocker date and time   Airway Mallampati: II  TM Distance: >3 FB Neck ROM: Full    Dental  (+) Dental Advisory Given, Implants   Pulmonary asthma , COPD,  COPD inhaler, Current Smoker and Patient abstained from smoking.,    breath sounds clear to auscultation       Cardiovascular hypertension, Pt. on medications and Pt. on home beta blockers  Rhythm:Regular Rate:Normal     Neuro/Psych  Headaches, PSYCHIATRIC DISORDERS Depression CVA    GI/Hepatic negative GI ROS, Neg liver ROS,   Endo/Other  negative endocrine ROS  Renal/GU negative Renal ROS     Musculoskeletal  (+) Arthritis , Right Knee Effusion/Infection   Abdominal   Peds  Hematology  (+) Blood dyscrasia, anemia ,   Anesthesia Other Findings   Reproductive/Obstetrics                             Anesthesia Physical  Anesthesia Plan  ASA: III  Anesthesia Plan: General   Post-op Pain Management:    Induction: Intravenous  PONV Risk Score and Plan: 2 and Dexamethasone, Ondansetron and Treatment may vary due to age or medical condition  Airway Management Planned: LMA  Additional Equipment: None  Intra-op Plan:   Post-operative Plan: Extubation in OR  Informed Consent: I have reviewed the patients History and Physical, chart, labs and discussed the procedure including the risks, benefits and alternatives for the proposed anesthesia with the patient or authorized representative who has indicated his/her understanding and acceptance.     Dental advisory given  Plan Discussed with: CRNA  Anesthesia Plan Comments:         Anesthesia Quick Evaluation

## 2021-03-12 NOTE — Progress Notes (Addendum)
ID Brief Note  Patient in OR for Rt knee I and D when attempted to see this afternoon.   Chart reviewed.  In Brief, 71 Y O female who presented to the ED after a dog bite in her Left upper extremity. S/p I and D of left distal forearm, wrist deep abscess, left hand subcutaneous palmar infection and left dorsal forearm subcutaneous infection on 3/10. OR cultures growing pastuerella multocida.   She was already on ceftriaxone and clindamycin for 4 days when she went to OR today for washout for Rt knee effusion after Synovial fluid analysis showed WBC 82,707 ( neutrophilic), no crystals. Cx no organisms on gram stain and No growth in <24 hrs.  MRSA PCR is negative  Given patient has h/o anaphylaxis to Penicillin  Recommend to DC clindamycin, continue ceftriaxone Add Metronidazole for anaerobic coverage  Patient will be seen tomorrow as a full consult.   Rosiland Oz, MD Infectious [place] [facility] Infectious [facility]

## 2021-03-12 NOTE — Anesthesia Postprocedure Evaluation (Signed)
Anesthesia Post Note  Patient: Farley Ly  Procedure(s) Performed: KNEE ARTHROSCOPY WITH MEDIAL MENISECTOMY; PLACEMENT OF ANTIBIOTIC BEADS (Right Knee)     Patient location during evaluation: PACU Anesthesia Type: General Level of consciousness: awake and alert Pain management: pain level controlled Vital Signs Assessment: post-procedure vital signs reviewed and stable Respiratory status: spontaneous breathing, nonlabored ventilation, respiratory function stable and patient connected to nasal cannula oxygen Cardiovascular status: blood pressure returned to baseline and stable Postop Assessment: no apparent nausea or vomiting Anesthetic complications: no   No complications documented.  Last Vitals:  Vitals:   03/12/21 1605 03/12/21 1620  BP: 116/65 131/72  Pulse: 83 91  Resp: 17 19  Temp:    SpO2: 100% 97%    Last Pain:  Vitals:   03/12/21 1605  TempSrc:   PainSc: 0-No pain                 Catalina Gravel

## 2021-03-12 NOTE — Progress Notes (Signed)
PT Note  Pt out of room for knee surgery.  PT will f/u tomorrow to attempt evaluation.  Lavonia Dana, PT   Acute Rehabilitation Services  Pager [phone] Office [phone] 03/12/2021

## 2021-03-12 NOTE — Anesthesia Procedure Notes (Signed)
Procedure Name: LMA Insertion Date/Time: 03/12/2021 2:24 PM Performed by: Clearnce Sorrel, CRNA Pre-anesthesia Checklist: Patient identified, Emergency Drugs available, Suction available, Patient being monitored and Timeout performed Patient Re-evaluated:Patient Re-evaluated prior to induction Oxygen Delivery Method: Circle system utilized Preoxygenation: Pre-oxygenation with 100% oxygen Induction Type: IV induction LMA: LMA inserted LMA Size: 3.0 Number of attempts: 1 Placement Confirmation: positive ETCO2 and breath sounds checked- equal and bilateral Tube secured with: Tape Dental Injury: Teeth and Oropharynx as per pre-operative assessment

## 2021-03-12 NOTE — Progress Notes (Signed)
Patient felt reasonably good last night but her effusion and clinical symptoms of pain and difficulty weightbearing returned this morning.  On exam she does have recurrent effusion to the level she had yesterday. White count in the fluid only 10,000 but I think this likely represents a partially treated intra-articular infection. Recommendation after long discussion with the patient at this time would be for arthroscopic washout and prolonged course of antibiotics with ID consult.  Risk and benefits are discussed include not limited to infection nerve vessel damage persistence of infection as well as development of arthritis in the knee.  Patient understands and in general her mindset is she wants to get everything taken care of during this hospitalization that needs to be taken care of so she does not have to come back.  All questions answered.

## 2021-03-12 NOTE — Transfer of Care (Signed)
Immediate Anesthesia Transfer of Care Note  Patient: Marie Lyons  Procedure(s) Performed: KNEE ARTHROSCOPY WITH MEDIAL MENISECTOMY (Right Knee)  Patient Location: PACU  Anesthesia Type:General  Level of Consciousness: drowsy  Airway & Oxygen Therapy: Patient Spontanous Breathing and Patient connected to nasal cannula oxygen  Post-op Assessment: Report given to RN and Post -op Vital signs reviewed and stable  Post vital signs: Reviewed and stable  Last Vitals:  Vitals Value Taken Time  BP 117/63 03/12/21 1550  Temp    Pulse 86 03/12/21 1553  Resp 17 03/12/21 1553  SpO2 100 % 03/12/21 1553  Vitals shown include unvalidated device data.  Last Pain:  Vitals:   03/12/21 1300  TempSrc:   PainSc: 0-No pain      Patients Stated Pain Goal: 0 (91/79/15 0569)  Complications: No complications documented.

## 2021-03-13 ENCOUNTER — Encounter (HOSPITAL_COMMUNITY): Payer: Self-pay | Admitting: Orthopedic Surgery

## 2021-03-13 DIAGNOSIS — L089 Local infection of the skin and subcutaneous tissue, unspecified: Secondary | ICD-10-CM | POA: Diagnosis not present

## 2021-03-13 DIAGNOSIS — M00832 Arthritis due to other bacteria, left wrist: Secondary | ICD-10-CM | POA: Diagnosis not present

## 2021-03-13 DIAGNOSIS — M009 Pyogenic arthritis, unspecified: Secondary | ICD-10-CM | POA: Diagnosis not present

## 2021-03-13 DIAGNOSIS — S61552D Open bite of left wrist, subsequent encounter: Secondary | ICD-10-CM | POA: Diagnosis not present

## 2021-03-13 DIAGNOSIS — I10 Essential (primary) hypertension: Secondary | ICD-10-CM | POA: Diagnosis not present

## 2021-03-13 DIAGNOSIS — W540XXA Bitten by dog, initial encounter: Secondary | ICD-10-CM | POA: Diagnosis not present

## 2021-03-13 DIAGNOSIS — S61452A Open bite of left hand, initial encounter: Secondary | ICD-10-CM | POA: Diagnosis not present

## 2021-03-13 LAB — RENAL FUNCTION PANEL
Albumin: 2.7 g/dL — ABNORMAL LOW (ref 3.5–5.0)
Anion gap: 11 (ref 5–15)
BUN: 9 mg/dL (ref 8–23)
CO2: 26 mmol/L (ref 22–32)
Calcium: 9.2 mg/dL (ref 8.9–10.3)
Chloride: 96 mmol/L — ABNORMAL LOW (ref 98–111)
Creatinine, Ser: 0.52 mg/dL (ref 0.44–1.00)
GFR, Estimated: >60 mL/min (ref >60)
Glucose, Bld: 136 mg/dL — ABNORMAL HIGH (ref 70–99)
Phosphorus: 4.3 mg/dL (ref 2.5–4.6)
Potassium: 4.4 mmol/L (ref 3.5–5.1)
Sodium: 133 mmol/L — ABNORMAL LOW (ref 135–145)

## 2021-03-13 LAB — CULTURE, BLOOD (ROUTINE X 2)
Culture: NO GROWTH
Culture: NO GROWTH
Special Requests: ADEQUATE
Special Requests: ADEQUATE

## 2021-03-13 LAB — CBC
HCT: 32.5 % — ABNORMAL LOW (ref 36.0–46.0)
Hemoglobin: 10.6 g/dL — ABNORMAL LOW (ref 12.0–15.0)
MCH: 31.4 pg (ref 26.0–34.0)
MCHC: 32.6 g/dL (ref 30.0–36.0)
MCV: 96.2 fL (ref 80.0–100.0)
Platelets: 234 10*3/uL (ref 150–400)
RBC: 3.38 MIL/uL — ABNORMAL LOW (ref 3.87–5.11)
RDW: 13.7 % (ref 11.5–15.5)
WBC: 7.1 10*3/uL (ref 4.0–10.5)
nRBC: 0 % (ref 0.0–0.2)

## 2021-03-13 LAB — AEROBIC/ANAEROBIC CULTURE W GRAM STAIN (SURGICAL/DEEP WOUND)

## 2021-03-13 LAB — CK: Total CK: 455 U/L — ABNORMAL HIGH (ref 38–234)

## 2021-03-13 LAB — MAGNESIUM: Magnesium: 1.9 mg/dL (ref 1.7–2.4)

## 2021-03-13 MED ORDER — HYDROCODONE-ACETAMINOPHEN 5-325 MG PO TABS
1.0000 | ORAL_TABLET | ORAL | Status: DC | PRN
  Administered 2021-03-13 (×2): 1 via ORAL
  Administered 2021-03-14 – 2021-03-15 (×8): 2 via ORAL
  Filled 2021-03-13: qty 1
  Filled 2021-03-13 (×7): qty 2
  Filled 2021-03-13: qty 1
  Filled 2021-03-13: qty 2

## 2021-03-13 MED ORDER — SODIUM CHLORIDE 0.9 % IV SOLN
400.0000 mg | Freq: Every day | INTRAVENOUS | Status: DC
  Administered 2021-03-13 – 2021-03-15 (×3): 400 mg via INTRAVENOUS
  Filled 2021-03-13 (×4): qty 8

## 2021-03-13 NOTE — Evaluation (Addendum)
Occupational Therapy Evaluation Patient Details Name: Marie Lyons MRN: [id number] DOB: Mar 17, 1950 Today's Date: 03/13/2021    History of Present Illness 71 y.o. female with medical history significant for COPD, depression, history of CVA, chronic back pain, and hypertension, presenting with dog bite with left wrist and forearm.  She underwent:Incision and drainage of left distal forearm, wrist deep abscess.  Incision and drainage left hand subcutaneous palmar infection.  Incision and drainage left dorsal forearm subcutaneous infection. Pt now also  s/p right knee arthroscopic irrigation and drainage with placement of antibiotic beads.   Clinical Impression   Pt making good progress with functional goals. Pt became unsteady bathroom after toileting tasks while walking to sink. PT coming to see pt and will trial with PFRW. Pt to continue with acute OT services to maximize level of function and safety.     Follow Up Recommendations  Home health OT;Supervision/Assistance - 24 hour;Follow surgeon's recommendation for DC plan and follow-up therapies (may need ST rehab of unable to get 24/7 at home)    Equipment Recommendations  Other (comment);3 in 1 bedside commode;Tub/shower seat (L PFRW, reacher)    Recommendations for Other Services       Precautions / Restrictions Precautions Precautions: Fall Restrictions Weight Bearing Restrictions: Yes LUE Weight Bearing: Non weight bearing (may WB through elbow) RLE Weight Bearing: Weight bearing as tolerated Other Position/Activity Restrictions: L wrist/hand      Mobility Bed Mobility Overal bed mobility: Needs Assistance Bed Mobility: Supine to Sit     Supine to sit: Min guard     General bed mobility comments: increased time and effort due to R knee pain    Transfers Overall transfer level: Needs assistance Equipment used: 1 person hand held assist Transfers: Sit to/from [facility] Sit to Stand: Min  assist Stand pivot transfers: Min guard       General transfer comment: Pt became unsteady ambulating to sink after after toileting , reports incresed knee pain after sitting on toilet    Balance Overall balance assessment: Needs assistance Sitting-balance support: No upper extremity supported Sitting balance-Leahy Scale: Good     Standing balance support: Bilateral upper extremity supported Standing balance-Leahy Scale: Poor                             ADL either performed or assessed with clinical judgement   ADL Overall ADL's : Needs assistance/impaired Eating/Feeding: Set up;Sitting   Grooming: Min guard;Wash/dry hands;Wash/dry face;Standing   Upper Body Bathing: Set up;Supervision/ safety;Sitting   Lower Body Bathing: Moderate assistance   Upper Body Dressing : Set up;Supervision/safety;Sitting   Lower Body Dressing: Moderate assistance   Toilet Transfer: Minimal assistance;Min guard;Ambulation;Comfort height toilet;Grab bars Toilet Transfer Details (indicate cue type and reason): cues for NWB L wrist/hand Toileting- Clothing Manipulation and Hygiene: Minimal assistance;Sit to/from stand       Functional mobility during ADLs: Minimal assistance;Min guard;Cueing for safety General ADL Comments: Pt became unsteady ambulating to sink after after toileting , reports incresed knee pain after sitting on toilet     Vision Patient Visual Report: No change from baseline       Perception     Praxis      Pertinent Vitals/Pain Pain Assessment: Faces Faces Pain Scale: Hurts even more Pain Location: R LE/knee during mobility Pain Descriptors / Indicators: Grimacing;Guarding;Sharp;Operative site guarding Pain Intervention(s): Limited activity within patient's tolerance;Monitored during session;Repositioned     Hand Dominance Right   Extremity/Trunk  Assessment Upper Extremity Assessment Upper Extremity Assessment: Generalized weakness;LUE  deficits/detail LUE Deficits / Details: splinted to L wrist and wrapped in ACE bandage. LUE: Unable to fully assess due to immobilization   Lower Extremity Assessment Lower Extremity Assessment: Defer to PT evaluation   Cervical / Trunk Assessment Cervical / Trunk Assessment: Normal   Communication Communication Communication: No difficulties   Cognition Arousal/Alertness: Awake/alert Behavior During Therapy: WFL for tasks assessed/performed Overall Cognitive Status: Within Functional Limits for tasks assessed                                     General Comments       Exercises     Shoulder Instructions      Home Living Family/patient expects to be discharged to:: Private residence Living Arrangements: Children (son lives with her but is not at home much) Available Help at Discharge: Family;Available PRN/intermittently Type of Home: Apartment Home Access: Level entry     Home Layout: One level     Bathroom Shower/Tub: Teacher, early years/pre: Handicapped height     Home Equipment: Grab bars - tub/shower;Grab bars - toilet          Prior Functioning/Environment Level of Independence: Independent                 OT Problem List: Decreased strength;Impaired balance (sitting and/or standing);Pain;Decreased activity tolerance;Decreased coordination;Decreased knowledge of use of DME or AE;Impaired UE functional use      OT Treatment/Interventions: Self-care/ADL training;Therapeutic exercise;Balance training;Therapeutic activities;DME and/or AE instruction;Patient/family education    OT Goals(Current goals can be found in the care plan section) Acute Rehab OT Goals Patient Stated Goal: Get back home to my dog OT Goal Formulation: With patient Time For Goal Achievement: 03/27/21 Potential to Achieve Goals: Good ADL Goals Pt Will Perform Grooming: with supervision;with set-up;standing Pt Will Perform Upper Body Bathing: with  set-up;with modified independence;sitting Pt Will Perform Lower Body Bathing: with min assist;sitting/lateral leans;sit to/from stand;with adaptive equipment Pt Will Perform Upper Body Dressing: with set-up;with modified independence;sitting Pt Will Perform Lower Body Dressing: with min assist;sitting/lateral leans;sit to/from stand;with caregiver independent in assisting Pt Will Transfer to Toilet: with min guard assist Pt Will Perform Toileting - Clothing Manipulation and hygiene: with min guard assist  OT Frequency: Min 2X/week   Barriers to D/C:            Co-evaluation              AM-PAC OT "6 Clicks" Daily Activity     Outcome Measure Help from another person eating meals?: A Little Help from another person taking care of personal grooming?: A Little Help from another person toileting, which includes using toliet, bedpan, or urinal?: A Little Help from another person bathing (including washing, rinsing, drying)?: A Lot Help from another person to put on and taking off regular upper body clothing?: A Little Help from another person to put on and taking off regular lower body clothing?: A Lot 6 Click Score: 16   End of Session Equipment Utilized During Treatment: Gait belt  Activity Tolerance: Patient limited by pain Patient left: with call bell/phone within reach;in chair;Other (comment) (with PT in to see pt)  OT Visit Diagnosis: Unsteadiness on feet (R26.81);Other abnormalities of gait and mobility (R26.89);Muscle weakness (generalized) (M62.81);Pain Pain - part of body: Knee;Hand (R knee, L hand/wrist)  Time: 7493-5521 OT Time Calculation (min): 34 min Charges:  OT General Charges $OT Visit: 1 Visit OT Evaluation $OT Re-eval: 1 Re-eval OT Treatments $Self Care/Home Management : 8-22 mins    Britt Bottom 03/13/2021, 1:53 PM

## 2021-03-13 NOTE — Consult Note (Signed)
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   Continue ceftriaxone 2gm IV q24h and metronidazole 539m PO q8h Wound care   #Right knee septic arthritis S/p I&D with ortho 03/12/21 with Op note reporting cloudy fluid.  Cultures NGTD and cell count with only 10,950 WBC (99% PMN), however, was on abx prior to cultures being done. Continue abx as above Will add Gram positive coverage with Daptomycin.  Check baseline CK, ESR, CRP   HPI:    Marie Stanforthis a 71y.o. female with PMHx of COPD, CVA, chronic back pain, HTN, depression, questionable chronic UTI, and multiple antibiotic allergies who presented with progressive left wrist and forearm pain, swelling, erythema after a recent dog bite on 03/06/21.  She underwent I&D by hand surgery on 03/09/21 and had been receiving ceftriaxone and clindamycin prior to the OR.   She then developed right knee pain with effusion and difficulty weight bearing on 03/11/21.  X ray and UKoreashowed a complex right knee effusion.  S/p needle aspiration with ortho and subsequent washout due to concern for septic arthritis.  She has no history of prosthetic joint in her lower extremity.  Cultures reviewed: MRSA nares PCR negative 3/12 knee cultures NGTD 3/10 wrist cultures Pasteurella multocida and bacteroides 3/9 blood cx negative x 2  Past Medical History:  Diagnosis Date  . Cardiac arrhythmia   . Chronic back pain   . High cholesterol   . Migraine   . Scoliosis   . Stroke ([facility]     Social History   Tobacco Use  . Smoking status:  Current Every Day Smoker    Packs/day: 0.50    Years: 25.00    Pack years: 12.50    Types: Cigarettes  . Smokeless tobacco: Never Used  Substance Use Topics  . Alcohol use: No  . Drug use: No    Family History  Problem Relation Age of Onset  . CAD Father   . Stroke Neg Hx     Allergies  Allergen Reactions  . Ciprofloxacin Shortness Of Breath    Denies this allergy on 09/23/16 (??)  . Keflex [Cephalexin] Anaphylaxis  . Penicillins Anaphylaxis    Has patient had a PCN reaction causing immediate rash, facial/tongue/throat swelling, SOB or lightheadedness with hypotension: Yes Has patient had a PCN reaction causing severe rash involving mucus membranes or skin necrosis: Yes Has patient had a PCN reaction that required hospitalization Yes Has patient had a PCN reaction occurring within the last 10 years: No If all of the above answers are "NO", then may proceed with Cephalosporin use.   . Aspirin Other (See Comments)    Aggravates ulcer  . Darvon [Propoxyphene Hcl] Other (See Comments)    Wheezing  . Ibuprofen Other (See Comments)    Wheezing  . Imitrex [Sumatriptan] Other (See Comments)    Contraindicated because pt had a stroke  . Meperidine Other (See Comments)    Unknown reaction (per patient)  . Monosodium Glutamate Other (See Comments)    Migraine   . Nitrates, Organic Other (See Comments)  Migraine   . Nsaids Other (See Comments)    Aggravates ulcer  . Sulfa Antibiotics Other (See Comments)    Unknown reaction (per patient)    Review of Systems  Constitutional: Negative for chills and fever.  Gastrointestinal: Negative.   Genitourinary: Negative.   Musculoskeletal: Positive for joint pain.  All other systems reviewed and are negative.   OBJECTIVE:   Blood pressure 136/72, pulse 88, temperature 98.4 F (36.9 C), temperature source Oral, resp. rate 18, height 5' 3"  (1.6 m), weight 46.3 kg, SpO2 93 %. Body mass index is 18.07 kg/m.  Physical  Exam Constitutional:      General: She is not in acute distress.    Appearance: Normal appearance.  HENT:     Head: Normocephalic and atraumatic.  Pulmonary:     Effort: Pulmonary effort is normal. No respiratory distress.  Musculoskeletal:     Comments: Left wrist wrapped in clean bandage.   Skin:    General: Skin is warm and dry.  Neurological:     General: No focal deficit present.     Mental Status: She is alert and oriented to person, place, and time.  Psychiatric:        Mood and Affect: Mood normal.        Behavior: Behavior normal.      Lab Results: Lab Results  Component Value Date   WBC 7.1 03/13/2021   HGB 10.6 (L) 03/13/2021   HCT 32.5 (L) 03/13/2021   MCV 96.2 03/13/2021   PLT 234 03/13/2021    Lab Results  Component Value Date   NA 133 (L) 03/13/2021   K 4.4 03/13/2021   CO2 26 03/13/2021   GLUCOSE 136 (H) 03/13/2021   BUN 9 03/13/2021   CREATININE 0.52 03/13/2021   CALCIUM 9.2 03/13/2021   GFRNONAA >60 03/13/2021   GFRAA >60 01/04/2018    Lab Results  Component Value Date   ALT 11 03/08/2021   AST 15 03/08/2021   ALKPHOS 52 03/08/2021   BILITOT 0.3 03/08/2021    No results found for: CRP  No results found for: ESRSEDRATE  I have reviewed the micro and lab results in Epic.  Imaging: No results found.   Imaging independently reviewed in Epic.  Raynelle Highland for Infectious Disease [facility] [phone] pager 03/13/2021, 2:22 PM

## 2021-03-13 NOTE — Progress Notes (Signed)
  Subjective: Patient is a 71 year old female who is POD1 s/p right knee arthroscopic irrigation and drainage with placement of antibiotic beads.  Reports she is doing well today and her knee pain is better though it is still there to a lesser degree.  Denies any subjective fevers, night sweats, fatigue.  She does note some chills at times.  Ate breakfast this morning without difficulty.  Objective: Vital signs in last 24 hours: Temp:  [97.3 F (36.3 C)-98.4 F (36.9 C)] 98.4 F (36.9 C) (03/14 0700) Pulse Rate:  [78-93] 88 (03/14 0700) Resp:  [17-19] 18 (03/14 0700) BP: (101-136)/(63-79) 136/72 (03/14 0700) SpO2:  [89 %-100 %] 93 % (03/14 0700)  Intake/Output from previous day: 03/13 0701 - 03/14 0700 In: 1510 [P.O.:460; I.V.:800; IV Piggyback:250] Out: 365 [Urine:350; Drains:5; Blood:10] Intake/Output this shift: No intake/output data recorded.  Exam:  Exam demonstrates palpable pedal pulse of the right lower extremity.  Postop dressings without any gross blood or drainage.  Hemovac drain in place.  Trace effusion of the right knee.  No calf tenderness.  Labs: Recent Labs    03/10/21 1441 03/11/21 0251 03/12/21 0305 03/13/21 0400  HGB 10.7* 10.4* 11.6* 10.6*   Recent Labs    03/12/21 0305 03/13/21 0400  WBC 7.8 7.1  RBC 3.62* 3.38*  HCT 34.8* 32.5*  PLT 191 234   Recent Labs    03/12/21 0305 03/13/21 0400  NA 134* 133*  K 4.6 4.4  CL 99 96*  CO2 29 26  BUN <5* 9  CREATININE 0.54 0.52  GLUCOSE 85 136*  CALCIUM 8.5* 9.2   No results for input(s): LABPT, INR in the last 72 hours.  Assessment/Plan: Patient is a 71 year old female who is POD1 s/p right knee arthroscopy irrigation and drainage.  She is doing well today and feels her pain has improved compared with yesterday prior to surgery.  Hemovac drain in place with no significant amount of drainage.  This was removed and the suprapatellar portal incision was redressed with gauze and Tegaderm.  Plan for  evaluation by infectious disease later today.  Disposition pending medical team decision.  Patient may weight-bear as tolerated on the right leg.  Plan for physical therapy evaluation later today.   Eura Radabaugh L Vitali Seibert 03/13/2021, 8:36 AM

## 2021-03-13 NOTE — Op Note (Signed)
Marie Lyons, CURB MEDICAL RECORD NO: [id number] ACCOUNT NO: [id number] DATE OF BIRTH: 12-03-1950 FACILITY: [facility] LOCATION: [id number] PHYSICIAN: Yetta Barre. Marlou Sa, MD  Operative Report   DATE OF PROCEDURE: 03/12/2021  PREOPERATIVE DIAGNOSIS:  Right knee infection.  POSTOPERATIVE DIAGNOSIS:  Right knee infection.  PROCEDURE:  Right knee arthroscopy with washout and debridement and placement of antibiotic beads.  SURGEON ATTENDING:  Meredith Pel, MD  ASSISTANT:  None.  INDICATIONS:  The patient is a 71 year old female with a dog bite to left arm earlier this week.  Developed spontaneous right knee effusion, which was painful approximately 2 days ago.  Aspiration yesterday demonstrated 10,000 white cells with 99%  neutrophils in a patient who has been on antibiotics.  No history of gout with negative crystals in the aspirate.  Presents now for arthroscopic knee washout and placement of antibiotic beads.  DESCRIPTION OF PROCEDURE:  The patient was brought to the operating room where general anesthetic was induced.  Preoperative antibiotics were administered.  Perioperative IV antibiotics were maintained.  Right knee was prescrubbed with alcohol and  Betadine and allowed to air dry, prepped with DuraPrep solution, and draped in a sterile manner.  [name] was not utilized.  Right knee was examined under anesthesia and found to have moderate effusion, which is the same amount of effusion that she had  yesterday.  The collateral and cruciate ligaments were stable.  Next, the timeout was called.  Anterior inferolateral portal was established.  Superolateral portal was then established.  The patient had significant effusion in the knee, which was cloudy  with sheets of white cells that came out.  All this was cultured yesterday.  Arthroscopy was performed.  The patient had no meniscal pathology and mild arthritis in all three compartments.  Thorough irrigation with 9 liters of irrigating solution was   performed.  With the cannula in the superolateral portal, antibiotic beads of vancomycin and gentamicin were placed.  Hemovac drain then placed in there as well.  The portals were closed using 3-0 nylon and the Hemovac was clamped.  A solution of  Marcaine, morphine, clonidine injected into the knee for postop pain relief.  Clamp released at 20 minutes after the injection was placed.  Knee was wrapped with an Ace wrap.  The patient tolerated the procedure well without immediate complications,  transferred to the recovery room in stable condition.   ROH D: 03/12/2021 4:07:44 pm T: 03/12/2021 6:18:00 pm  JOB: [id number]/ [id number]

## 2021-03-13 NOTE — Evaluation (Signed)
Physical Therapy Evaluation Patient Details Name: Marie Lyons MRN: [id number] DOB: 07-15-1950 Today's Date: 03/13/2021   History of Present Illness  71 y.o. female with medical history significant for COPD, depression, history of CVA, chronic back pain, and hypertension, presenting with dog bite with left wrist and forearm.  She underwent:Incision and drainage of left distal forearm, wrist deep abscess.  Incision and drainage left hand subcutaneous palmar infection.  Incision and drainage left dorsal forearm subcutaneous infection. Pt now also  s/p right knee arthroscopic irrigation and drainage with placement of antibiotic beads.    Clinical Impression  Pt admitted with above. Pt was mod I PTA but now has R Knee pain with limited WBing tolerance. Pt given L platform walker in which pt appreciative and reports feeling safe with. Pt requiring minA for ADLs and ambulation, in addition to transfers. Pt unsafe to be home alone, will need 24/7 assist for safe d/c home. Acute PT to cont to follow.    Follow Up Recommendations Home health PT;Supervision/Assistance - 24 hour (will need ST-SNF if 24/7 can not be provided)    Equipment Recommendations   (L platform walker)    Recommendations for Other Services       Precautions / Restrictions Precautions Precautions: Fall Restrictions Weight Bearing Restrictions: Yes LUE Weight Bearing: Non weight bearing RLE Weight Bearing: Weight bearing as tolerated Other Position/Activity Restrictions: L wrist/hand      Mobility  Bed Mobility Overal bed mobility: Needs Assistance Bed Mobility: Supine to Sit     Supine to sit: Min guard     General bed mobility comments: pt up in chair upon PT arrival    Transfers Overall transfer level: Needs assistance Equipment used: Rolling walker (2 wheeled) Transfers: Sit to/from Stand Sit to Stand: Min assist Stand pivot transfers: Min guard       General transfer comment: minA to steady during  transition of hands up onto platform walker, increased time  Ambulation/Gait Ambulation/Gait assistance: Min assist Gait Distance (Feet): 60 Feet Assistive device: Left platform walker Gait Pattern/deviations: Step-through pattern;Decreased stride length Gait velocity: dec Gait velocity interpretation: <1.31 ft/sec, indicative of household ambulator General Gait Details: pt slow to move, max verbal cues for sequencing, minA to help move RW forward, decreased R LE WBing tolerance, pt reports increased stability with L platform walker than without, pt states "I think I would fall on my face wtihtout it"  Stairs            Wheelchair Mobility    Modified Rankin (Stroke Patients Only)       Balance Overall balance assessment: Needs assistance Sitting-balance support: No upper extremity supported Sitting balance-Leahy Scale: Good     Standing balance support: Bilateral upper extremity supported Standing balance-Leahy Scale: Poor Standing balance comment: requires RW                             Pertinent Vitals/Pain Pain Assessment: Faces Faces Pain Scale: Hurts even more Pain Location: R LE/knee during mobility and ROM Pain Descriptors / Indicators: Grimacing;Guarding;Sharp;Operative site guarding Pain Intervention(s): Limited activity within patient's tolerance    Home Living Family/patient expects to be discharged to:: Private residence Living Arrangements: Children (son lives with her but is not at home much) Available Help at Discharge: Family;Available PRN/intermittently Type of Home: Apartment Home Access: Level entry     Home Layout: One level Home Equipment: Grab bars - tub/shower;Grab bars - toilet Additional Comments: pt reports she  lives in handicapped apt    Prior Function Level of Independence: Independent               Hand Dominance   Dominant Hand: Right    Extremity/Trunk Assessment   Upper Extremity Assessment Upper  Extremity Assessment: Defer to OT evaluation LUE Deficits / Details: splinted to L wrist and wrapped in ACE bandage. LUE: Unable to fully assess due to immobilization    Lower Extremity Assessment Lower Extremity Assessment: RLE deficits/detail RLE Deficits / Details: limited R knee ROM due to onset of pain, hip and ankle WFL    Cervical / Trunk Assessment Cervical / Trunk Assessment: Normal  Communication   Communication: No difficulties (stuttering, increased time to respond)  Cognition Arousal/Alertness: Awake/alert Behavior During Therapy: WFL for tasks assessed/performed Overall Cognitive Status: Within Functional Limits for tasks assessed                                        General Comments General comments (skin integrity, edema, etc.): pt with dressing over R knee incisions    Exercises Other Exercises Other Exercises: attempted R knee flexion/extension pt with minimal tolerance   Assessment/Plan    PT Assessment Patient needs continued PT services  PT Problem List Decreased strength;Decreased activity tolerance;Decreased balance;Decreased mobility;Decreased coordination;Decreased cognition;Decreased knowledge of use of DME;Decreased safety awareness       PT Treatment Interventions DME instruction;Gait training;Stair training;Functional mobility training;Therapeutic activities;Therapeutic exercise;Balance training    PT Goals (Current goals can be found in the Care Plan section)  Acute Rehab PT Goals Patient Stated Goal: Get back home to my dog PT Goal Formulation: With patient Time For Goal Achievement: 03/27/21 Potential to Achieve Goals: Good    Frequency Min 4X/week   Barriers to discharge Decreased caregiver support pt reports she believes she can make arrangements    Co-evaluation               AM-PAC PT "6 Clicks" Mobility  Outcome Measure Help needed turning from your back to your side while in a flat bed without using  bedrails?: A Little Help needed moving from lying on your back to sitting on the side of a flat bed without using bedrails?: A Little Help needed moving to and from a bed to a chair (including a wheelchair)?: A Little Help needed standing up from a chair using your arms (e.g., wheelchair or bedside chair)?: A Little Help needed to walk in hospital room?: A Little Help needed climbing 3-5 steps with a railing? : A Little 6 Click Score: 18    End of Session Equipment Utilized During Treatment: Gait belt Activity Tolerance: Patient tolerated treatment well Patient left: in chair;with call bell/phone within reach;with chair alarm set Nurse Communication: Mobility status PT Visit Diagnosis: Unsteadiness on feet (R26.81);Muscle weakness (generalized) (M62.81);Difficulty in walking, not elsewhere classified (R26.2);Other symptoms and signs involving the nervous system (K46.286)    Time: 3817-7116 PT Time Calculation (min) (ACUTE ONLY): 22 min   Charges:   PT Evaluation $PT Eval Moderate Complexity: 1 Mod          Kittie Plater, PT, DPT Acute Rehabilitation Services Pager #: [phone] Office #: [phone]   Marie Lyons 03/13/2021, 2:26 PM

## 2021-03-13 NOTE — Progress Notes (Signed)
PROGRESS NOTE    Marie Lyons   OIN:[id number]  DOB: August 08, 1950  PCP: Maury Dus, MD    DOA: 03/08/2021 LOS: 1   Brief Narrative   71 year old F with PMH of COPD not on oxygen, CVA, chronic back pain, HTN and depression presenting to ED for evaluation of progressive left wrist and forearm pain, redness and swelling after dog bite on 03/06/2021.  She underwent I&D by hand surgery, Dr. Erskine Speed on 03/09/2021.    Patient developed acute right knee pain with effusion and difficulty weightbearing on 3/12.  X-ray and US showed a complex right knee effusion.  Underwent needle aspiration by Ortho.  Although fluid study does not suggest infection, concern for septic arthritis remained high as she was on IV antibiotic prior to aspiration.  Underwent arthroscopic washout on 3/13.  ID is following.  Currently on Rocephin, Flagyl  Assessment & Plan   Principal Problem:   Infected dog bite of hand Active Problems:   Essential hypertension   COPD (chronic obstructive pulmonary disease) (HCC)   History of CVA (cerebrovascular accident)   Dog bite of left wrist with infection   Hand abscess   Dog bite   Infected dog bite involving Left Hand, Wrist, Forearm -  --S/p incision and drainage by Dr. Apolonio Schneiders on 03/09/2021. --Continue Rocephin and Flagyl --Surgical culture Pasteurella multocida --Pain control: IV fentanyl, Norco, Flexeril and Tylenol for pain. --Plan was to f/up with Dr. Jori Moll on 3/14 in clinic, needs rescheduled or seen here prior to d/c.  Dr. Apolonio Schneiders notified. --Continue splint 24/7 until outpatient follow-up --Ice and elevate  Acute Right Knee Effusion - Septic Arthritis - joint aspirate culture without growth, high wbc's and pt was previously on antibiotics.  No trauma or injury.  ?seeded infection from above.  Status post arthroscopic washout on 3/13. --ID following - plan to add Dapto pending baseline labs --Antibiotics as above --PT evaluation --Pain control  PRN  Hypokalemia - Resolved.  Monitor BMP. Replace as needed.  Mild hyponatremia - mild, Na 134.  Monitor BMP.  COPD - not acutely exacerbated, not wheezing.  Continue PRN Albuterol.  Hypertension - chronic, stable. Continue metoprolol.  History of CVA - without residual deficits. Continue statin.  Not on ASA due to reported intolerance.  Depression - stable. Continue Lexapro.  Iron deficiency anemia: H&H stable.  Iron sat 5%. Folic acid deficiency --Status post IV Feraheme on 3/12 --Continue oral folic acid supplement  Underweight - Body mass index is 18.07 kg/m. Nutrition Problem: Inadequate oral intake Etiology: decreased appetite Signs/Symptoms: meal completion < 50% Interventions: Ensure Enlive (each supplement provides 350kcal and 20 grams of protein),MVI   DVT prophylaxis: SCDs Start: 03/12/21 1730 SCD's Start: 03/09/21 2152 SCDs Start: 03/08/21 2121   Diet:  Diet Orders (From admission, onward)    Start     Ordered   03/12/21 1730  Diet regular Room service appropriate? Yes; Fluid consistency: Thin  Diet effective now       Question Answer Comment  Room service appropriate? Yes   Fluid consistency: Thin      03/12/21 1729   03/11/21 0000  Diet general        03/11/21 1058            Code Status: Full Code    Subjective 03/13/21    Pt having a great deal of pain in the right knee.  Seen sitting up edge of bed, wincing several times, then asked to lay back in the bed.  Concern regarding  her ability to ambulate safely.  No fever/chills.  Pain only complaint.   Disposition Plan & Communication   Status is: Inpatient  Inpatient status remains appropriate due to severity of illness with ongoing uncontrolled pain, on IV antibiotics, therapy evaluations pending for safe dispo planning.  Dispo: The patient is from: Home              Anticipated d/c is to: Home with [facility] vs SNF              Patient currently is not medically stable for d/c.   Difficult to  place patient No   Consults, Procedures, Significant Events   Consultants:  Hand Surgery Orthopedic surgery Infectious disease  Procedures:   Wrist split applied  Left hand/wrist wash out   Right knee arthroscopic washout    Antimicrobials:  Anti-infectives (From admission, onward)   Start     Dose/Rate Route Frequency Ordered Stop   03/12/21 1845  metroNIDAZOLE (FLAGYL) tablet 500 mg        500 mg Oral Every 8 hours 03/12/21 1747     03/12/21 1505  gentamicin (GARAMYCIN) injection  Status:  Discontinued          As needed 03/12/21 1614 03/12/21 1619   03/12/21 1505  vancomycin (VANCOCIN) powder  Status:  Discontinued          As needed 03/12/21 1615 03/12/21 1619   03/11/21 0000  cefdinir (OMNICEF) 300 MG capsule        300 mg Oral 2 times daily 03/11/21 1058     03/11/21 0000  metroNIDAZOLE (FLAGYL) 500 MG tablet        500 mg Oral 3 times daily 03/11/21 1058 03/18/21 2359   03/09/21 0115  clindamycin (CLEOCIN) IVPB 600 mg  Status:  Discontinued        600 mg 100 mL/hr over 30 Minutes Intravenous Every 8 hours 03/08/21 2106 03/12/21 1729   03/08/21 2200  cefTRIAXone (ROCEPHIN) 2 g in sodium chloride 0.9 % 100 mL IVPB        2 g 200 mL/hr over 30 Minutes Intravenous Every 24 hours 03/08/21 2105     03/08/21 1715  clindamycin (CLEOCIN) IVPB 600 mg        600 mg 100 mL/hr over 30 Minutes Intravenous  Once 03/08/21 1707 03/08/21 1847   03/08/21 1600  doxycycline (VIBRAMYCIN) 100 mg in sodium chloride 0.9 % 250 mL IVPB  Status:  Discontinued        100 mg 125 mL/hr over 120 Minutes Intravenous  Once 03/08/21 1556 03/08/21 2054        Micro    Objective   Vitals:   03/12/21 2100 03/13/21 0636 03/13/21 0700 03/13/21 1502  BP: 105/75 126/74 136/72 (!) 164/86  Pulse: 87 78 88 86  Resp: 17 18 18 18   Temp: 98.3 F (36.8 C) 98.2 F (36.8 C) 98.4 F (36.9 C) 98.5 F (36.9 C)  TempSrc: Oral Oral Oral Oral  SpO2: 94% (!) 89% 93% 97%  Weight:      Height:         Intake/Output Summary (Last 24 hours) at 03/13/2021 1535 Last data filed at 03/13/2021 0900 Gross per 24 hour  Intake 760 ml  Output 355 ml  Net 405 ml   Filed Weights   03/08/21 1504 03/09/21 1759  Weight: 46.3 kg 46.3 kg    Physical Exam:  General exam: awake, alert, intermittently appears in distress due to pain, underweight HEENT: clear  conjunctiva, anicteric sclera, dry mucus membranes, hearing grossly normal  Respiratory system: CTAB, no wheezes, rales or rhonchi, normal respiratory effort. Cardiovascular system: normal S1/S2, RRR, no pedal edema.   Central nervous system: A&O x3. no gross focal neurologic deficits, normal speech Extremities: Left wrist in splint and Ace wrap, Right knee incision sites intact without bleeding or drainage, right knee swelling, moves all extremities. Psychiatry: normal mood, congruent affect, judgement and insight appear normal  Labs   Data Reviewed: I have personally reviewed following labs and imaging studies  CBC: Recent Labs  Lab 03/08/21 1528 03/09/21 0255 03/10/21 1441 03/11/21 0251 03/12/21 0305 03/13/21 0400  WBC 12.7* 11.9* 11.8* 7.0 7.8 7.1  NEUTROABS 10.6* 9.5* 9.7* 5.0  --   --   HGB 12.0 10.9* 10.7* 10.4* 11.6* 10.6*  HCT 37.9 34.2* 33.9* 30.9* 34.8* 32.5*  MCV 97.9 96.6 98.8 95.1 96.1 96.2  PLT 195 189 175 183 191 409   Basic Metabolic Panel: Recent Labs  Lab 03/09/21 0255 03/10/21 0302 03/11/21 0251 03/12/21 0305 03/13/21 0400  NA 134* 130* 136 134* 133*  K 3.5 4.0 3.2* 4.6 4.4  CL 99 96* 100 99 96*  CO2 27 23 28 29 26   GLUCOSE 110* 117* 107* 85 136*  BUN 7* 9 8 <5* 9  CREATININE 0.62 0.65 0.64 0.54 0.52  CALCIUM 8.4* 8.0* 8.1* 8.5* 9.2  MG 1.7 1.6* 2.0 2.1 1.9  PHOS  --   --  3.7 3.5 4.3   GFR: Estimated Creatinine Clearance: 46.5 mL/min (by C-G formula based on SCr of 0.52 mg/dL). Liver Function Tests: Recent Labs  Lab 03/08/21 1528 03/11/21 0251 03/12/21 0305 03/13/21 0400  AST 15  --   --    --   ALT 11  --   --   --   ALKPHOS 52  --   --   --   BILITOT 0.3  --   --   --   PROT 7.1  --   --   --   ALBUMIN 3.9 2.3* 2.4* 2.7*   No results for input(s): LIPASE, AMYLASE in the last 168 hours. No results for input(s): AMMONIA in the last 168 hours. Coagulation Profile: No results for input(s): INR, PROTIME in the last 168 hours. Cardiac Enzymes: No results for input(s): CKTOTAL, CKMB, CKMBINDEX, TROPONINI in the last 168 hours. BNP (last 3 results) No results for input(s): PROBNP in the last 8760 hours. HbA1C: No results for input(s): HGBA1C in the last 72 hours. CBG: No results for input(s): GLUCAP in the last 168 hours. Lipid Profile: No results for input(s): CHOL, HDL, LDLCALC, TRIG, CHOLHDL, LDLDIRECT in the last 72 hours. Thyroid Function Tests: No results for input(s): TSH, T4TOTAL, FREET4, T3FREE, THYROIDAB in the last 72 hours. Anemia Panel: Recent Labs    03/11/21 0251  VITAMINB12 180  FOLATE 3.4*  FERRITIN 104  TIBC 197*  IRON 9*  RETICCTPCT 1.8   Sepsis Labs: Recent Labs  Lab 03/08/21 1528  LATICACIDVEN 1.9    Recent Results (from the past 240 hour(s))  Culture, blood (routine x 2)     Status: None   Collection Time: 03/08/21  3:28 PM   Specimen: BLOOD RIGHT FOREARM  Result Value Ref Range Status   Specimen Description BLOOD RIGHT FOREARM  Final   Special Requests   Final    BOTTLES DRAWN AEROBIC AND ANAEROBIC Blood Culture adequate volume   Culture   Final    NO GROWTH 5 DAYS Performed at [facility]  [facility], [place] [address], [place], [place] [address]    Report Status 03/13/2021 FINAL  Final  Culture, blood (routine x 2)     Status: None   Collection Time: 03/08/21  3:31 PM   Specimen: BLOOD  Result Value Ref Range Status   Specimen Description BLOOD LEFT ANTECUBITAL  Final   Special Requests   Final    BOTTLES DRAWN AEROBIC AND ANAEROBIC Blood Culture adequate volume   Culture   Final    NO GROWTH 5 DAYS Performed at [facility], [place] [address] [place], [place] [address]    Report Status 03/13/2021 FINAL  Final  Resp Panel by RT-PCR (Flu A&B, Covid) Nasopharyngeal Swab     Status: None   Collection Time: 03/08/21  4:42 PM   Specimen: Nasopharyngeal Swab; Nasopharyngeal(NP) swabs in vial transport medium  Result Value Ref Range Status   SARS Coronavirus 2 by RT PCR NEGATIVE NEGATIVE Final    Comment: (NOTE) SARS-CoV-2 target nucleic acids are NOT DETECTED.  The SARS-CoV-2 RNA is generally detectable in upper respiratory specimens during the acute phase of infection. The lowest concentration of SARS-CoV-2 viral copies this assay can detect is 138 copies/mL. A negative result does not preclude SARS-Cov-2 infection and should not be used as the sole basis for treatment or other patient management decisions. A negative result may occur with  improper specimen collection/handling, submission of specimen other than nasopharyngeal swab, presence of viral mutation(s) within the areas targeted by this assay, and inadequate number of viral copies(<138 copies/mL). A negative result must be combined with clinical observations, patient history, and epidemiological information. The expected result is Negative.  Fact Sheet for Patients:  EntrepreneurPulse.com.au  Fact Sheet for Healthcare Providers:  IncredibleEmployment.be  This test is no t yet approved or cleared by the [place] FDA and  has been authorized for detection and/or diagnosis of SARS-CoV-2 by FDA under an Emergency Use Authorization (EUA). This EUA will remain  in effect (meaning this test can be used) for the duration of the COVID-19 declaration under Section 564(b)(1) of the Act, 21 U.S.C.section 360bbb-3(b)(1), unless the authorization is terminated  or revoked sooner.       Influenza A by PCR NEGATIVE NEGATIVE Final   Influenza B by PCR NEGATIVE NEGATIVE Final    Comment: (NOTE) The Xpert Xpress  SARS-CoV-2/FLU/RSV plus assay is intended as an aid in the diagnosis of influenza from Nasopharyngeal swab specimens and should not be used as a sole basis for treatment. Nasal washings and aspirates are unacceptable for Xpert Xpress SARS-CoV-2/FLU/RSV testing.  Fact Sheet for Patients: EntrepreneurPulse.com.au  Fact Sheet for Healthcare Providers: IncredibleEmployment.be  This test is not yet approved or cleared by the [place] FDA and has been authorized for detection and/or diagnosis of SARS-CoV-2 by FDA under an Emergency Use Authorization (EUA). This EUA will remain in effect (meaning this test can be used) for the duration of the COVID-19 declaration under Section 564(b)(1) of the Act, 21 U.S.C. section 360bbb-3(b)(1), unless the authorization is terminated or revoked.    Aerobic/Anaerobic Culture w Gram Stain (surgical/deep wound)     Status: None   Collection Time: 03/09/21  9:02 PM   Specimen: Other Source; Tissue  Result Value Ref Range Status   Specimen Description WOUND LEFT WRIST SPEC A  Final   Special Requests LEFT WRIST DOG BITE  Final   Gram Stain   Final    FEW WBC PRESENT,BOTH PMN AND MONONUCLEAR ABUNDANT GRAM POSITIVE  COCCI ABUNDANT GRAM NEGATIVE RODS    Culture   Final    MODERATE PASTEURELLA MULTOCIDA Usually susceptible to penicillin and other beta lactam agents,quinolones,macrolides and tetracyclines. BACTEROIDES SPECIES BETA LACTAMASE NEGATIVE Performed at [facility], [place] [address], [place], [place] [address]    Report Status 03/13/2021 FINAL  Final  Aerobic/Anaerobic Culture w Gram Stain (surgical/deep wound)     Status: None (Preliminary result)   Collection Time: 03/11/21  5:28 PM   Specimen: Synovium; Synovial Fluid  Result Value Ref Range Status   Specimen Description SYNOVIAL FLUID  Final   Special Requests JOINT KNEE  Final   Gram Stain   Final    ABUNDANT WBC PRESENT, PREDOMINANTLY PMN NO  ORGANISMS SEEN    Culture   Final    NO GROWTH 2 DAYS NO ANAEROBES ISOLATED; CULTURE IN PROGRESS FOR 5 DAYS Performed at [facility], [place] [address], [place], [place] [address]    Report Status PENDING  Incomplete  Surgical pcr screen     Status: None   Collection Time: 03/12/21  1:43 PM   Specimen: Nasal Mucosa; Nasal Swab  Result Value Ref Range Status   MRSA, PCR NEGATIVE NEGATIVE Final   Staphylococcus aureus NEGATIVE NEGATIVE Final    Comment: (NOTE) The Xpert SA Assay (FDA approved for NASAL specimens in patients 92 years of age and older), is one component of a comprehensive surveillance program. It is not intended to diagnose infection nor to guide or monitor treatment. Performed at [facility], [place] [address], [place], [place] [address]       Imaging Studies   No results found.   Medications   Scheduled Meds: . cyclobenzaprine  10 mg Oral QHS  . docusate sodium  100 mg Oral BID  . escitalopram  20 mg Oral Daily  . feeding supplement  237 mL Oral BID BM  . folic acid  1 mg Oral Daily  . melatonin  10 mg Oral QHS  . metoprolol succinate  25 mg Oral QHS  . metroNIDAZOLE  500 mg Oral Q8H  . multivitamin with minerals  1 tablet Oral Daily  . simvastatin  20 mg Oral QHS   Continuous Infusions: . sodium chloride Stopped (03/08/21 1847)  . cefTRIAXone (ROCEPHIN)  IV 2 g (03/12/21 2259)  . lactated ringers 10 mL/hr at 03/12/21 1402       LOS: 1 day    Time spent: 30 minutes    Ezekiel Slocumb, DO [facility]  03/13/2021, 3:35 PM      If 7PM-7AM, please contact night-coverage. How to contact the [facility] Attending or Consulting provider [place] or covering provider during after hours [place], for this patient?    1. Check the care team in [facility] and look for a) attending/consulting [facility] provider listed and b) the [facility] team listed 2. Log into [email or web address] and use [place]'s universal password to access. If you do not have the password,  please contact the hospital operator. 3. Locate the [facility] provider you are looking for under [facility] and page to a number that you can be directly reached. 4. If you still have difficulty reaching the provider, please page the [facility] (Director on Call) for the Hospitalists listed on amion for assistance.

## 2021-03-14 ENCOUNTER — Inpatient Hospital Stay: Payer: Self-pay

## 2021-03-14 DIAGNOSIS — L089 Local infection of the skin and subcutaneous tissue, unspecified: Secondary | ICD-10-CM

## 2021-03-14 DIAGNOSIS — L02519 Cutaneous abscess of unspecified hand: Secondary | ICD-10-CM | POA: Diagnosis not present

## 2021-03-14 DIAGNOSIS — Z8673 Personal history of transient ischemic attack (TIA), and cerebral infarction without residual deficits: Secondary | ICD-10-CM

## 2021-03-14 DIAGNOSIS — M71162 Other infective bursitis, left knee: Secondary | ICD-10-CM

## 2021-03-14 DIAGNOSIS — B9689 Other specified bacterial agents as the cause of diseases classified elsewhere: Secondary | ICD-10-CM

## 2021-03-14 DIAGNOSIS — I10 Essential (primary) hypertension: Secondary | ICD-10-CM | POA: Diagnosis not present

## 2021-03-14 DIAGNOSIS — A280 Pasteurellosis: Secondary | ICD-10-CM

## 2021-03-14 DIAGNOSIS — W540XXD Bitten by dog, subsequent encounter: Secondary | ICD-10-CM

## 2021-03-14 DIAGNOSIS — S61552D Open bite of left wrist, subsequent encounter: Secondary | ICD-10-CM | POA: Diagnosis not present

## 2021-03-14 DIAGNOSIS — W540XXA Bitten by dog, initial encounter: Secondary | ICD-10-CM

## 2021-03-14 DIAGNOSIS — S61452A Open bite of left hand, initial encounter: Secondary | ICD-10-CM

## 2021-03-14 LAB — CK: Total CK: 425 U/L — ABNORMAL HIGH (ref 38–234)

## 2021-03-14 LAB — SEDIMENTATION RATE: Sed Rate: 39 mm/hr — ABNORMAL HIGH (ref 0–22)

## 2021-03-14 LAB — C-REACTIVE PROTEIN: CRP: 10.5 mg/dL — ABNORMAL HIGH (ref <1.0)

## 2021-03-14 MED ORDER — ERTAPENEM IV (FOR PTA / DISCHARGE USE ONLY): 1.0000 g | INTRAVENOUS | 0 refills | Status: AC

## 2021-03-14 MED ORDER — SODIUM CHLORIDE 0.9 % IV SOLN
1.0000 g | INTRAVENOUS | Status: DC
  Administered 2021-03-15: 1000 mg via INTRAVENOUS
  Filled 2021-03-14: qty 1

## 2021-03-14 MED ORDER — DAPTOMYCIN IV (FOR PTA / DISCHARGE USE ONLY): 400.0000 mg | INTRAVENOUS | 0 refills | Status: AC

## 2021-03-14 MED ORDER — ENOXAPARIN SODIUM 40 MG/0.4ML ~~LOC~~ SOLN
40.0000 mg | SUBCUTANEOUS | Status: DC
  Administered 2021-03-14 – 2021-03-15 (×2): 40 mg via SUBCUTANEOUS
  Filled 2021-03-14 (×2): qty 0.4

## 2021-03-14 NOTE — Plan of Care (Signed)

## 2021-03-14 NOTE — Progress Notes (Signed)
PHARMACY CONSULT NOTE FOR:  OUTPATIENT  PARENTERAL ANTIBIOTIC THERAPY (OPAT)  Indication: Pasturella infection of left wrist and culture negative septic arthritis of right knee Regimen: Daptomycin 400 mg IV q24h, Ertapenem 1g IV q24h End date: 04/09/2021  IV antibiotic discharge orders are pended. To discharging provider:  please sign these orders via discharge navigator,  Select New Orders & click on the button choice - Manage This Unsigned Work.     Thank you for allowing pharmacy to be a part of this patient's care.  Claudina Lick, PharmD PGY1 Acute Care Pharmacy Resident 03/14/2021 1:52 PM  Please check [email or web address] for unit-specific pharmacy phone numbers.

## 2021-03-14 NOTE — Progress Notes (Signed)
PROGRESS NOTE    Marie Lyons   IEP:[id number]  DOB: 05-29-1950  PCP: Maury Dus, MD    DOA: 03/08/2021 LOS: 2   Brief Narrative   71 year old F with PMH of COPD not on oxygen, CVA, chronic back pain, HTN and depression presenting to ED for evaluation of progressive left wrist and forearm pain, redness and swelling after dog bite on 03/06/2021.  She underwent I&D by hand surgery, Dr. Erskine Speed on 03/09/2021.    Patient developed acute right knee pain with effusion and difficulty weightbearing on 3/12.  X-ray and US showed a complex right knee effusion.  Underwent needle aspiration by Ortho.  Although fluid study does not suggest infection, concern for septic arthritis remained high as she was on IV antibiotic prior to aspiration. Underwent arthroscopic washout on 3/13.  ID is following.  Currently on Daptomycin, Rocephin, Flagyl  Assessment & Plan   Principal Problem:   Infected dog bite of hand Active Problems:   Essential hypertension   COPD (chronic obstructive pulmonary disease) (HCC)   History of CVA (cerebrovascular accident)   Dog bite of left wrist with infection   Hand abscess   Dog bite   Infected dog bite involving Left Hand, Wrist, Forearm -  S/p incision and drainage by Dr. Apolonio Schneiders on 03/09/2021. Surgical culture Pasteurella multocida. --Continue Rocephin and Flagyl --Pain control: IV fentanyl, Norco, Flexeril and Tylenol for pain. --Follow up with Dr. Jori Moll on 03/16/21 at 9:30 AM.   Call if need to reschedule (original f/u was 3/14 but pt still admitted) --Continue splint 24/7 until outpatient follow-up --Ice and elevate  Acute Right Knee Effusion - Septic Arthritis - joint aspirate culture without growth, high wbc's and pt was previously on antibiotics.  No trauma or injury.  ?seeded infection from above.  Status post arthroscopic washout on 3/13. --ID, Orthopedics following  --Antibiotics as above + Daptomycin --Per Ortho: WBAT on RLE --Ortho follow up with  Dr. Marlou Sa in 7-10 days --PT: [facility] recommended with 24 hr assistance --Pain control PRN --Resume Lovenox for VTE ppx  Hypokalemia - Resolved.  Monitor BMP. Replace as needed.  Mild hyponatremia - mild, Na 134.  Monitor BMP.  COPD - not acutely exacerbated, not wheezing.  Continue PRN Albuterol.  Hypertension - chronic, stable. Continue metoprolol.  History of CVA - without residual deficits.  --Hold statin due to CK elevation.   --Not on ASA due to reported intolerance, hx of ulcers.  Depression - stable. Continue Lexapro.  Iron deficiency anemia: H&H stable.  Iron sat 5%. Folic acid deficiency --Status post IV Feraheme on 3/12 --Continue oral folic acid supplement  Underweight - Body mass index is 18.07 kg/m. Nutrition Problem: Inadequate oral intake Etiology: decreased appetite Signs/Symptoms: meal completion < 50% Interventions: Ensure Enlive (each supplement provides 350kcal and 20 grams of protein),MVI   DVT prophylaxis: SCDs Start: 03/12/21 1730 SCD's Start: 03/09/21 2152 SCDs Start: 03/08/21 2121   Diet:  Diet Orders (From admission, onward)    Start     Ordered   03/12/21 1730  Diet regular Room service appropriate? Yes; Fluid consistency: Thin  Diet effective now       Question Answer Comment  Room service appropriate? Yes   Fluid consistency: Thin      03/12/21 1729   03/11/21 0000  Diet general        03/11/21 1058            Code Status: Full Code    Subjective 03/14/21    Pt  up in chair when seen today.  Reports ongoing R knee pain, medication helps but not as well as it could, wears off too fast.  Denies fever/chills.  Great deal of pain with any weightbearing or ambulation.  No other acute complaints.  States will have plenty of help from her sons at home.    Disposition Plan & Communication   Status is: Inpatient  Inpatient status remains appropriate due to severity of illness with ongoing uncontrolled pain, on IV antibiotics, therapy  evaluations pending for safe dispo planning.  Dispo: The patient is from: Home              Anticipated d/c is to: Home with [facility] vs SNF              Patient currently is not medically stable for d/c.   Difficult to place patient No   Consults, Procedures, Significant Events   Consultants:  Hand Surgery Orthopedic surgery Infectious disease  Procedures:   Wrist split applied  Left hand/wrist wash out   Right knee arthroscopic washout    Antimicrobials:  Anti-infectives (From admission, onward)   Start     Dose/Rate Route Frequency Ordered Stop   03/15/21 1100  ertapenem (INVANZ) 1,000 mg in sodium chloride 0.9 % 100 mL IVPB        1 g 200 mL/hr over 30 Minutes Intravenous Every 24 hours 03/14/21 1151     03/14/21 0000  ertapenem (INVANZ) IVPB        1 g Intravenous Every 24 hours 03/14/21 1351 04/09/21 2359   03/14/21 0000  daptomycin (CUBICIN) IVPB        400 mg Intravenous Every 24 hours 03/14/21 1351 04/09/21 2359   03/13/21 2000  DAPTOmycin (CUBICIN) 400 mg in sodium chloride 0.9 % IVPB        400 mg 216 mL/hr over 30 Minutes Intravenous Daily 03/13/21 1540     03/12/21 1845  metroNIDAZOLE (FLAGYL) tablet 500 mg        500 mg Oral Every 8 hours 03/12/21 1747 03/14/21 2359   03/12/21 1505  gentamicin (GARAMYCIN) injection  Status:  Discontinued          As needed 03/12/21 1614 03/12/21 1619   03/12/21 1505  vancomycin (VANCOCIN) powder  Status:  Discontinued          As needed 03/12/21 1615 03/12/21 1619   03/11/21 0000  cefdinir (OMNICEF) 300 MG capsule  Status:  Discontinued        300 mg Oral 2 times daily 03/11/21 1058 03/14/21    03/11/21 0000  metroNIDAZOLE (FLAGYL) 500 MG tablet  Status:  Discontinued        500 mg Oral 3 times daily 03/11/21 1058 03/14/21    03/09/21 0115  clindamycin (CLEOCIN) IVPB 600 mg  Status:  Discontinued        600 mg 100 mL/hr over 30 Minutes Intravenous Every 8 hours 03/08/21 2106 03/12/21 1729   03/08/21 2200  cefTRIAXone  (ROCEPHIN) 2 g in sodium chloride 0.9 % 100 mL IVPB        2 g 200 mL/hr over 30 Minutes Intravenous Every 24 hours 03/08/21 2105 03/14/21 2359   03/08/21 1715  clindamycin (CLEOCIN) IVPB 600 mg        600 mg 100 mL/hr over 30 Minutes Intravenous  Once 03/08/21 1707 03/08/21 1847   03/08/21 1600  doxycycline (VIBRAMYCIN) 100 mg in sodium chloride 0.9 % 250 mL IVPB  Status:  Discontinued  100 mg 125 mL/hr over 120 Minutes Intravenous  Once 03/08/21 1556 03/08/21 2054        Micro    Objective   Vitals:   03/13/21 1502 03/13/21 2024 03/14/21 0508 03/14/21 0842  BP: (!) 164/86 135/70 118/75 119/75  Pulse: 86 78 77 80  Resp: 18 16 15 17   Temp: 98.5 F (36.9 C) (!) 97.5 F (36.4 C) 98.5 F (36.9 C) 98.6 F (37 C)  TempSrc: Oral Oral Oral Oral  SpO2: 97% 91% 93% 93%  Weight:      Height:        Intake/Output Summary (Last 24 hours) at 03/14/2021 1505 Last data filed at 03/14/2021 0500 Gross per 24 hour  Intake 1400 ml  Output -  Net 1400 ml   Filed Weights   03/08/21 1504 03/09/21 1759  Weight: 46.3 kg 46.3 kg    Physical Exam:  General exam: awake, alert, intermittently appears in distress due to pain, underweight Respiratory system: on room air, normal respiratory effort. Cardiovascular system: RRR, no pedal edema.   Central nervous system: A&O x3. no gross focal neurologic deficits, normal speech Extremities: Left wrist in splint and Ace wrap, right knee swelling and tenderness, moves all extremities. Psychiatry: normal mood, congruent affect, judgement and insight appear normal  Labs   Data Reviewed: I have personally reviewed following labs and imaging studies  CBC: Recent Labs  Lab 03/08/21 1528 03/09/21 0255 03/10/21 1441 03/11/21 0251 03/12/21 0305 03/13/21 0400  WBC 12.7* 11.9* 11.8* 7.0 7.8 7.1  NEUTROABS 10.6* 9.5* 9.7* 5.0  --   --   HGB 12.0 10.9* 10.7* 10.4* 11.6* 10.6*  HCT 37.9 34.2* 33.9* 30.9* 34.8* 32.5*  MCV 97.9 96.6 98.8 95.1  96.1 96.2  PLT 195 189 175 183 191 735   Basic Metabolic Panel: Recent Labs  Lab 03/09/21 0255 03/10/21 0302 03/11/21 0251 03/12/21 0305 03/13/21 0400  NA 134* 130* 136 134* 133*  K 3.5 4.0 3.2* 4.6 4.4  CL 99 96* 100 99 96*  CO2 27 23 28 29 26   GLUCOSE 110* 117* 107* 85 136*  BUN 7* 9 8 <5* 9  CREATININE 0.62 0.65 0.64 0.54 0.52  CALCIUM 8.4* 8.0* 8.1* 8.5* 9.2  MG 1.7 1.6* 2.0 2.1 1.9  PHOS  --   --  3.7 3.5 4.3   GFR: Estimated Creatinine Clearance: 46.5 mL/min (by C-G formula based on SCr of 0.52 mg/dL). Liver Function Tests: Recent Labs  Lab 03/08/21 1528 03/11/21 0251 03/12/21 0305 03/13/21 0400  AST 15  --   --   --   ALT 11  --   --   --   ALKPHOS 52  --   --   --   BILITOT 0.3  --   --   --   PROT 7.1  --   --   --   ALBUMIN 3.9 2.3* 2.4* 2.7*   No results for input(s): LIPASE, AMYLASE in the last 168 hours. No results for input(s): AMMONIA in the last 168 hours. Coagulation Profile: No results for input(s): INR, PROTIME in the last 168 hours. Cardiac Enzymes: Recent Labs  Lab 03/13/21 1611 03/14/21 0340  CKTOTAL 455* 425*   BNP (last 3 results) No results for input(s): PROBNP in the last 8760 hours. HbA1C: No results for input(s): HGBA1C in the last 72 hours. CBG: No results for input(s): GLUCAP in the last 168 hours. Lipid Profile: No results for input(s): CHOL, HDL, LDLCALC, TRIG, CHOLHDL, LDLDIRECT in the  last 72 hours. Thyroid Function Tests: No results for input(s): TSH, T4TOTAL, FREET4, T3FREE, THYROIDAB in the last 72 hours. Anemia Panel: No results for input(s): VITAMINB12, FOLATE, FERRITIN, TIBC, IRON, RETICCTPCT in the last 72 hours. Sepsis Labs: Recent Labs  Lab 03/08/21 1528  LATICACIDVEN 1.9    Recent Results (from the past 240 hour(s))  Culture, blood (routine x 2)     Status: None   Collection Time: 03/08/21  3:28 PM   Specimen: BLOOD RIGHT FOREARM  Result Value Ref Range Status   Specimen Description BLOOD RIGHT  FOREARM  Final   Special Requests   Final    BOTTLES DRAWN AEROBIC AND ANAEROBIC Blood Culture adequate volume   Culture   Final    NO GROWTH 5 DAYS Performed at [facility], [place] [address] [place], [place] [address]    Report Status 03/13/2021 FINAL  Final  Culture, blood (routine x 2)     Status: None   Collection Time: 03/08/21  3:31 PM   Specimen: BLOOD  Result Value Ref Range Status   Specimen Description BLOOD LEFT ANTECUBITAL  Final   Special Requests   Final    BOTTLES DRAWN AEROBIC AND ANAEROBIC Blood Culture adequate volume   Culture   Final    NO GROWTH 5 DAYS Performed at [facility], [place] [address], [place], [place] [address]    Report Status 03/13/2021 FINAL  Final  Resp Panel by RT-PCR (Flu A&B, Covid) Nasopharyngeal Swab     Status: None   Collection Time: 03/08/21  4:42 PM   Specimen: Nasopharyngeal Swab; Nasopharyngeal(NP) swabs in vial transport medium  Result Value Ref Range Status   SARS Coronavirus 2 by RT PCR NEGATIVE NEGATIVE Final    Comment: (NOTE) SARS-CoV-2 target nucleic acids are NOT DETECTED.  The SARS-CoV-2 RNA is generally detectable in upper respiratory specimens during the acute phase of infection. The lowest concentration of SARS-CoV-2 viral copies this assay can detect is 138 copies/mL. A negative result does not preclude SARS-Cov-2 infection and should not be used as the sole basis for treatment or other patient management decisions. A negative result may occur with  improper specimen collection/handling, submission of specimen other than nasopharyngeal swab, presence of viral mutation(s) within the areas targeted by this assay, and inadequate number of viral copies(<138 copies/mL). A negative result must be combined with clinical observations, patient history, and epidemiological information. The expected result is Negative.  Fact Sheet for Patients:  EntrepreneurPulse.com.au  Fact Sheet for Healthcare  Providers:  IncredibleEmployment.be  This test is no t yet approved or cleared by the [place] FDA and  has been authorized for detection and/or diagnosis of SARS-CoV-2 by FDA under an Emergency Use Authorization (EUA). This EUA will remain  in effect (meaning this test can be used) for the duration of the COVID-19 declaration under Section 564(b)(1) of the Act, 21 U.S.C.section 360bbb-3(b)(1), unless the authorization is terminated  or revoked sooner.       Influenza A by PCR NEGATIVE NEGATIVE Final   Influenza B by PCR NEGATIVE NEGATIVE Final    Comment: (NOTE) The Xpert Xpress SARS-CoV-2/FLU/RSV plus assay is intended as an aid in the diagnosis of influenza from Nasopharyngeal swab specimens and should not be used as a sole basis for treatment. Nasal washings and aspirates are unacceptable for Xpert Xpress SARS-CoV-2/FLU/RSV testing.  Fact Sheet for Patients: EntrepreneurPulse.com.au  Fact Sheet for Healthcare Providers: IncredibleEmployment.be  This test is not yet approved or cleared by  the [employer] and has been authorized for detection and/or diagnosis of SARS-CoV-2 by FDA under an Emergency Use Authorization (EUA). This EUA will remain in effect (meaning this test can be used) for the duration of the COVID-19 declaration under Section 564(b)(1) of the Act, 21 U.S.C. section 360bbb-3(b)(1), unless the authorization is terminated or revoked.    Aerobic/Anaerobic Culture w Gram Stain (surgical/deep wound)     Status: None   Collection Time: 03/09/21  9:02 PM   Specimen: Other Source; Tissue  Result Value Ref Range Status   Specimen Description WOUND LEFT WRIST SPEC A  Final   Special Requests LEFT WRIST DOG BITE  Final   Gram Stain   Final    FEW WBC PRESENT,BOTH PMN AND MONONUCLEAR ABUNDANT GRAM POSITIVE COCCI ABUNDANT GRAM NEGATIVE RODS    Culture   Final    MODERATE PASTEURELLA  MULTOCIDA Usually susceptible to penicillin and other beta lactam agents,quinolones,macrolides and tetracyclines. BACTEROIDES SPECIES BETA LACTAMASE NEGATIVE Performed at [facility], [place] [address] [place], [place] [address]    Report Status 03/13/2021 FINAL  Final  Aerobic/Anaerobic Culture w Gram Stain (surgical/deep wound)     Status: None (Preliminary result)   Collection Time: 03/11/21  5:28 PM   Specimen: Synovium; Synovial Fluid  Result Value Ref Range Status   Specimen Description SYNOVIAL FLUID  Final   Special Requests JOINT KNEE  Final   Gram Stain   Final    ABUNDANT WBC PRESENT, PREDOMINANTLY PMN NO ORGANISMS SEEN    Culture   Final    NO GROWTH 3 DAYS NO ANAEROBES ISOLATED; CULTURE IN PROGRESS FOR 5 DAYS Performed at [facility], [address] [place], [place] [address]    Report Status PENDING  Incomplete  Surgical pcr screen     Status: None   Collection Time: 03/12/21  1:43 PM   Specimen: Nasal Mucosa; Nasal Swab  Result Value Ref Range Status   MRSA, PCR NEGATIVE NEGATIVE Final   Staphylococcus aureus NEGATIVE NEGATIVE Final    Comment: (NOTE) The Xpert SA Assay (FDA approved for NASAL specimens in patients 34 years of age and older), is one component of a comprehensive surveillance program. It is not intended to diagnose infection nor to guide or monitor treatment. Performed at [facility], [place] [address] [place], [place] [address]       Imaging Studies   [place] EKG SITE RITE  Result Date: 03/14/2021 If Site Rite image not attached, placement could not be confirmed due to current cardiac rhythm.    Medications   Scheduled Meds: . cyclobenzaprine  10 mg Oral QHS  . docusate sodium  100 mg Oral BID  . escitalopram  20 mg Oral Daily  . feeding supplement  237 mL Oral BID BM  . folic acid  1 mg Oral Daily  . melatonin  10 mg Oral QHS  . metoprolol succinate  25 mg Oral QHS  . metroNIDAZOLE  500 mg Oral Q8H  . multivitamin  with minerals  1 tablet Oral Daily  . simvastatin  20 mg Oral QHS   Continuous Infusions: . sodium chloride Stopped (03/08/21 1847)  . cefTRIAXone (ROCEPHIN)  IV 2 g (03/13/21 2138)  . DAPTOmycin (CUBICIN)  IV 400 mg (03/13/21 2022)  . [START ON 03/15/2021] ertapenem    . lactated ringers 10 mL/hr at 03/12/21 1402       LOS: 2 days    Time spent: 30 minutes with >  50% spent in coordination of care and at bedside.     Ezekiel Slocumb, DO [facility]  03/14/2021, 3:05 PM      If 7PM-7AM, please contact night-coverage. How to contact the [facility] Attending or Consulting provider [place] or covering provider during after hours [place], for this patient?    1. Check the care team in [facility] and look for a) attending/consulting [facility] provider listed and b) the [facility] team listed 2. Log into [email or web address] and use [place]'s universal password to access. If you do not have the password, please contact the hospital operator. 3. Locate the [facility] provider you are looking for under [facility] and page to a number that you can be directly reached. 4. If you still have difficulty reaching the provider, please page the [facility] (Director on Call) for the Hospitalists listed on amion for assistance.

## 2021-03-14 NOTE — Plan of Care (Signed)

## 2021-03-14 NOTE — Progress Notes (Signed)
  Subjective: Patient stable.  Difficulty bearing weight on RLE due to pain.  Denies fever, chills, night sweats.  Has difficulty lifting leg due to pain.  PT recommends 24 hour assist with HHPT   Objective: Vital signs in last 24 hours: Temp:  [97.5 F (36.4 C)-98.6 F (37 C)] 98.6 F (37 C) (03/15 0842) Pulse Rate:  [77-80] 80 (03/15 0842) Resp:  [15-17] 17 (03/15 0842) BP: (118-135)/(70-75) 119/75 (03/15 0842) SpO2:  [91 %-93 %] 93 % (03/15 0842)  Intake/Output from previous day: 03/14 0701 - 03/15 0700 In: 1880 [P.O.:480; IV Piggyback:1400] Out: -  Intake/Output this shift: Total I/O In: 2612 [I.V.:461; IV Piggyback:2151] Out: -   Exam: Small effusion of right knee. No increased warmth compared to the contralateral side.  No calf tenderness.  No calf fluctuance.  Dressings are without any gross blood or drainage.  Palpable pedal pulse of the right lower extremity.   Labs: Recent Labs    03/12/21 0305 03/13/21 0400  HGB 11.6* 10.6*   Recent Labs    03/12/21 0305 03/13/21 0400  WBC 7.8 7.1  RBC 3.62* 3.38*  HCT 34.8* 32.5*  PLT 191 234   Recent Labs    03/12/21 0305 03/13/21 0400  NA 134* 133*  K 4.6 4.4  CL 99 96*  CO2 29 26  BUN <5* 9  CREATININE 0.54 0.52  GLUCOSE 85 136*  CALCIUM 8.5* 9.2   No results for input(s): LABPT, INR in the last 72 hours.  Assessment/Plan: POD 2 s/p right knee arthroscopy could be irrigation and drainage.  Continued pain in the right knee that is limiting her weightbearing.  She is okay to weight-bear as tolerated with platform walker to assist with stability.  She does have family to help her out at home.  Plan to order home health physical therapy and have her follow-up with Dr. Marlou Sa in clinic about 7 to 10 days following her procedure.  Disposition pending medical team.   Donella Stade 03/14/2021, 4:31 PM

## 2021-03-14 NOTE — Progress Notes (Signed)
[facility] for Infectious Disease  Date of Admission:  03/08/2021     Total days of antibiotics 7         ASSESSMENT:  Ms. Schadt's synovial cultures remain without growth and left wrist cultures Pasturella multocida and Bacteroides species. Tolerating daptomycin, ceftriaxone and metronidazole with no adverse side effects. Discussed plan of care to include 4 weeks of home therapy with Daptomycin and change ceftriaxone to ertapenem as she has concerns for antibiotic associated colitis. PICC line placement for home health therapy. OPAT / Home Health orders below. Arrange follow up in ID office. Continue with wound care per orthopedics.   PLAN:  1. Continue daptomycin and change ceftriaxone to ertapenem.  2. Wound care per orthopedics. 3. PICC line placement.  4. Home Health and OPAT orders.  5. Arrange follow up in ID office.   Diagnosis:  Dog bite wound infection and septic arthritis of right knee  Culture Result: Pasturella and Bacteroides   Allergies  Allergen Reactions  . Ciprofloxacin Shortness Of Breath    Denies this allergy on 09/23/16 (??)  . Keflex [Cephalexin] Anaphylaxis  . Penicillins Anaphylaxis    Has patient had a PCN reaction causing immediate rash, facial/tongue/throat swelling, SOB or lightheadedness with hypotension: Yes Has patient had a PCN reaction causing severe rash involving mucus membranes or skin necrosis: Yes Has patient had a PCN reaction that required hospitalization Yes Has patient had a PCN reaction occurring within the last 10 years: No If all of the above answers are "NO", then may proceed with Cephalosporin use.   . Aspirin Other (See Comments)    Aggravates ulcer  . Darvon [Propoxyphene Hcl] Other (See Comments)    Wheezing  . Ibuprofen Other (See Comments)    Wheezing  . Imitrex [Sumatriptan] Other (See Comments)    Contraindicated because pt had a stroke  . Meperidine Other (See Comments)    Unknown reaction (per patient)  .  Monosodium Glutamate Other (See Comments)    Migraine   . Nitrates, Organic Other (See Comments)    Migraine   . Nsaids Other (See Comments)    Aggravates ulcer  . Sulfa Antibiotics Other (See Comments)    Unknown reaction (per patient)    OPAT Orders Discharge antibiotics to be given via PICC line Discharge antibiotics: Per pharmacy protocol  Aim for Vancomycin trough 15-20 or AUC 400-550 (unless otherwise indicated) Duration:  4 weeks  End Date:  04/09/21  [facility] Care Per Protocol:  Home health RN for IV administration and teaching; PICC line care and labs.    Labs weekly while on IV antibiotics: _X_ CBC with differential _X_ BMP __ CMP _X_ CRP _X_ ESR __ Vancomycin trough _X_ CK  _X_ Please pull PIC at completion of IV antibiotics __ Please leave PIC in place until doctor has seen patient or been notified  Fax weekly labs to [phone]  Clinic Follow Up Appt:  2 pm on 3/28 with Dr. Juleen China   Principal Problem:   Infected dog bite of hand Active Problems:   Essential hypertension   COPD (chronic obstructive pulmonary disease) ([place])   History of CVA (cerebrovascular accident)   Dog bite of left wrist with infection   Hand abscess   Dog bite   . cyclobenzaprine  10 mg Oral QHS  . docusate sodium  100 mg Oral BID  . escitalopram  20 mg Oral Daily  . feeding supplement  237 mL Oral BID BM  . folic acid  1 mg Oral Daily  . melatonin  10 mg Oral QHS  . metoprolol succinate  25 mg Oral QHS  . metroNIDAZOLE  500 mg Oral Q8H  . multivitamin with minerals  1 tablet Oral Daily  . simvastatin  20 mg Oral QHS    SUBJECTIVE:  Afebrile overnight with no acute events.   Allergies  Allergen Reactions  . Ciprofloxacin Shortness Of Breath    Denies this allergy on 09/23/16 (??)  . Keflex [Cephalexin] Anaphylaxis  . Penicillins Anaphylaxis    Has patient had a PCN reaction causing immediate rash, facial/tongue/throat swelling, SOB or lightheadedness with  hypotension: Yes Has patient had a PCN reaction causing severe rash involving mucus membranes or skin necrosis: Yes Has patient had a PCN reaction that required hospitalization Yes Has patient had a PCN reaction occurring within the last 10 years: No If all of the above answers are "NO", then may proceed with Cephalosporin use.   . Aspirin Other (See Comments)    Aggravates ulcer  . Darvon [Propoxyphene Hcl] Other (See Comments)    Wheezing  . Ibuprofen Other (See Comments)    Wheezing  . Imitrex [Sumatriptan] Other (See Comments)    Contraindicated because pt had a stroke  . Meperidine Other (See Comments)    Unknown reaction (per patient)  . Monosodium Glutamate Other (See Comments)    Migraine   . Nitrates, Organic Other (See Comments)    Migraine   . Nsaids Other (See Comments)    Aggravates ulcer  . Sulfa Antibiotics Other (See Comments)    Unknown reaction (per patient)     Review of Systems: Review of Systems  Constitutional: Negative for chills, fever and weight loss.  Respiratory: Negative for cough, shortness of breath and wheezing.   Cardiovascular: Negative for chest pain and leg swelling.  Gastrointestinal: Negative for abdominal pain, constipation, diarrhea, nausea and vomiting.  Skin: Negative for rash.      OBJECTIVE: Vitals:   03/13/21 1502 03/13/21 2024 03/14/21 0508 03/14/21 0842  BP: (!) 164/86 135/70 118/75 119/75  Pulse: 86 78 77 80  Resp: 18 16 15 17   Temp: 98.5 F (36.9 C) (!) 97.5 F (36.4 C) 98.5 F (36.9 C) 98.6 F (37 C)  TempSrc: Oral Oral Oral Oral  SpO2: 97% 91% 93% 93%  Weight:      Height:       Body mass index is 18.07 kg/m.  Physical Exam Constitutional:      General: She is not in acute distress.    Appearance: She is well-developed.     Comments: Seated in the chair next to the bed; pleasant.   Cardiovascular:     Rate and Rhythm: Normal rate and regular rhythm.     Heart sounds: Normal heart sounds.  Pulmonary:      Effort: Pulmonary effort is normal.     Breath sounds: Normal breath sounds.  Musculoskeletal:     Comments: Surgical dressing left hand is clean and dry.  Skin:    General: Skin is warm and dry.  Neurological:     Mental Status: She is alert and oriented to person, place, and time.  Psychiatric:        Mood and Affect: Mood normal.     Lab Results Lab Results  Component Value Date   WBC 7.1 03/13/2021   HGB 10.6 (L) 03/13/2021   HCT 32.5 (L) 03/13/2021   MCV 96.2 03/13/2021   PLT 234 03/13/2021    Lab Results  Component Value Date   CREATININE 0.52 03/13/2021   BUN 9 03/13/2021   NA 133 (L) 03/13/2021   K 4.4 03/13/2021   CL 96 (L) 03/13/2021   CO2 26 03/13/2021    Lab Results  Component Value Date   ALT 11 03/08/2021   AST 15 03/08/2021   ALKPHOS 52 03/08/2021   BILITOT 0.3 03/08/2021     Microbiology: Recent Results (from the past 240 hour(s))  Culture, blood (routine x 2)     Status: None   Collection Time: 03/08/21  3:28 PM   Specimen: BLOOD RIGHT FOREARM  Result Value Ref Range Status   Specimen Description BLOOD RIGHT FOREARM  Final   Special Requests   Final    BOTTLES DRAWN AEROBIC AND ANAEROBIC Blood Culture adequate volume   Culture   Final    NO GROWTH 5 DAYS Performed at [facility], [address], [place], [place] [address]    Report Status 03/13/2021 FINAL  Final  Culture, blood (routine x 2)     Status: None   Collection Time: 03/08/21  3:31 PM   Specimen: BLOOD  Result Value Ref Range Status   Specimen Description BLOOD LEFT ANTECUBITAL  Final   Special Requests   Final    BOTTLES DRAWN AEROBIC AND ANAEROBIC Blood Culture adequate volume   Culture   Final    NO GROWTH 5 DAYS Performed at [facility], [place] [address], [place], [place] [address]    Report Status 03/13/2021 FINAL  Final  Resp Panel by RT-PCR (Flu A&B, Covid) Nasopharyngeal Swab     Status: None   Collection Time: 03/08/21  4:42 PM   Specimen:  Nasopharyngeal Swab; Nasopharyngeal(NP) swabs in vial transport medium  Result Value Ref Range Status   SARS Coronavirus 2 by RT PCR NEGATIVE NEGATIVE Final    Comment: (NOTE) SARS-CoV-2 target nucleic acids are NOT DETECTED.  The SARS-CoV-2 RNA is generally detectable in upper respiratory specimens during the acute phase of infection. The lowest concentration of SARS-CoV-2 viral copies this assay can detect is 138 copies/mL. A negative result does not preclude SARS-Cov-2 infection and should not be used as the sole basis for treatment or other patient management decisions. A negative result may occur with  improper specimen collection/handling, submission of specimen other than nasopharyngeal swab, presence of viral mutation(s) within the areas targeted by this assay, and inadequate number of viral copies(<138 copies/mL). A negative result must be combined with clinical observations, patient history, and epidemiological information. The expected result is Negative.  Fact Sheet for Patients:  EntrepreneurPulse.com.au  Fact Sheet for Healthcare Providers:  IncredibleEmployment.be  This test is no t yet approved or cleared by the [place] FDA and  has been authorized for detection and/or diagnosis of SARS-CoV-2 by FDA under an Emergency Use Authorization (EUA). This EUA will remain  in effect (meaning this test can be used) for the duration of the COVID-19 declaration under Section 564(b)(1) of the Act, 21 U.S.C.section 360bbb-3(b)(1), unless the authorization is terminated  or revoked sooner.       Influenza A by PCR NEGATIVE NEGATIVE Final   Influenza B by PCR NEGATIVE NEGATIVE Final    Comment: (NOTE) The Xpert Xpress SARS-CoV-2/FLU/RSV plus assay is intended as an aid in the diagnosis of influenza from Nasopharyngeal swab specimens and should not be used as a sole basis for treatment. Nasal washings and aspirates are unacceptable for  Xpert Xpress SARS-CoV-2/FLU/RSV testing.  Fact Sheet for Patients:  EntrepreneurPulse.com.au  Fact Sheet for Healthcare Providers: IncredibleEmployment.be  This test is not yet approved or cleared by the [place] FDA and has been authorized for detection and/or diagnosis of SARS-CoV-2 by FDA under an Emergency Use Authorization (EUA). This EUA will remain in effect (meaning this test can be used) for the duration of the COVID-19 declaration under Section 564(b)(1) of the Act, 21 U.S.C. section 360bbb-3(b)(1), unless the authorization is terminated or revoked.    Aerobic/Anaerobic Culture w Gram Stain (surgical/deep wound)     Status: None   Collection Time: 03/09/21  9:02 PM   Specimen: Other Source; Tissue  Result Value Ref Range Status   Specimen Description WOUND LEFT WRIST SPEC A  Final   Special Requests LEFT WRIST DOG BITE  Final   Gram Stain   Final    FEW WBC PRESENT,BOTH PMN AND MONONUCLEAR ABUNDANT GRAM POSITIVE COCCI ABUNDANT GRAM NEGATIVE RODS    Culture   Final    MODERATE PASTEURELLA MULTOCIDA Usually susceptible to penicillin and other beta lactam agents,quinolones,macrolides and tetracyclines. BACTEROIDES SPECIES BETA LACTAMASE NEGATIVE Performed at [facility], [place] [address] [place], [place] [address]    Report Status 03/13/2021 FINAL  Final  Aerobic/Anaerobic Culture w Gram Stain (surgical/deep wound)     Status: None (Preliminary result)   Collection Time: 03/11/21  5:28 PM   Specimen: Synovium; Synovial Fluid  Result Value Ref Range Status   Specimen Description SYNOVIAL FLUID  Final   Special Requests JOINT KNEE  Final   Gram Stain   Final    ABUNDANT WBC PRESENT, PREDOMINANTLY PMN NO ORGANISMS SEEN    Culture   Final    NO GROWTH 2 DAYS NO ANAEROBES ISOLATED; CULTURE IN PROGRESS FOR 5 DAYS Performed at [facility], [place] [address], [place], [place] [address]    Report Status PENDING   Incomplete  Surgical pcr screen     Status: None   Collection Time: 03/12/21  1:43 PM   Specimen: Nasal Mucosa; Nasal Swab  Result Value Ref Range Status   MRSA, PCR NEGATIVE NEGATIVE Final   Staphylococcus aureus NEGATIVE NEGATIVE Final    Comment: (NOTE) The Xpert SA Assay (FDA approved for NASAL specimens in patients 81 years of age and older), is one component of a comprehensive surveillance program. It is not intended to diagnose infection nor to guide or monitor treatment. Performed at [facility], [place] [address], [place], [place] [address]      Terri Piedra, [place] Infectious Disease [facility]  03/14/2021  11:34 AM

## 2021-03-14 NOTE — Progress Notes (Signed)
Physical Therapy Treatment Patient Details Name: Marie Lyons MRN: [id number] DOB: [birth year] Today's Date: 03/14/2021    History of Present Illness 71 y.o. female with medical history significant for COPD, depression, history of CVA, chronic back pain, and hypertension, presenting with dog bite with left wrist and forearm.  She underwent:Incision and drainage of left distal forearm, wrist deep abscess.  Incision and drainage left hand subcutaneous palmar infection.  Incision and drainage left dorsal forearm subcutaneous infection. Pt now also  s/p right knee arthroscopic irrigation and drainage with placement of antibiotic beads.    PT Comments    Pt with c/o of R knee pain limiting transfer and ambulation tolerance and safety requiring L platform walker. Pt will minimal tolerance of R knee ROM today, pt with improved L platform walker management and ambulation tolerance. Spoke extensively with patient and son Veleta Miners regarding d/c plans discussing pt's need for 24/7 assist for safe d/c home as pt at increased falls risk and is unable to complete ADLs or IADLs without assist without using L hand (which is NWB) or causing significant R knee pain. Pt also with urinary urgency and is at risk of not making it in time to the bathroom and would have incontinence in which pt would not be able to safely address her self. Son, Veleta Miners, is to talk with his brother [name] regarding d/c recommendations.   Follow Up Recommendations  Home health PT;Supervision/Assistance - 24 hour (will need ST-SNF if 24/7 assist can't be provided)     Equipment Recommendations   (L platform walker)    Recommendations for Other Services       Precautions / Restrictions Precautions Precautions: Fall Restrictions Weight Bearing Restrictions: Yes LUE Weight Bearing: Non weight bearing RLE Weight Bearing: Weight bearing as tolerated Other Position/Activity Restrictions: L wrist/hand    Mobility  Bed Mobility Overal bed  mobility: Needs Assistance Bed Mobility: Supine to Sit     Supine to sit: Supervision     General bed mobility comments: HOB elevated, pt able to bring LEs off EOB, verbal cues to not push down with L UE to scoot to EOB    Transfers Overall transfer level: Needs assistance Equipment used: 1 person hand held assist Transfers: Sit to/from [facility] Sit to Stand: Min assist Stand pivot transfers: Min assist       General transfer comment: minA to stedy, antalgic limp on R LE, pt requesting BSC due to urinary urgency  Ambulation/Gait Ambulation/Gait assistance: Min assist Gait Distance (Feet): 100 Feet Assistive device: Left platform walker Gait Pattern/deviations: Step-through pattern;Decreased stride length;Decreased weight shift to right;Narrow base of support Gait velocity: dec Gait velocity interpretation: <1.31 ft/sec, indicative of household ambulator General Gait Details: pt slow to move, attempted to amb without platform walker, pt very unsteady and reaching out with L hand despite L hand NWB, pt with improved ability to manage platform walker, freq standing rest breaks due to R knee pain   Stairs             Wheelchair Mobility    Modified Rankin (Stroke Patients Only)       Balance Overall balance assessment: Needs assistance Sitting-balance support: No upper extremity supported Sitting balance-Leahy Scale: Good     Standing balance support: Single extremity supported Standing balance-Leahy Scale: Poor Standing balance comment: due to R knee pain pt requiring unilateral support to stedy self  Cognition Arousal/Alertness: Awake/alert Behavior During Therapy: WFL for tasks assessed/performed Overall Cognitive Status: Within Functional Limits for tasks assessed                                 General Comments: pt emotional regarding lack of help  at home, her son that lives with her  says he can't take a few days off      Exercises Other Exercises Other Exercises: worked on assisted heel slide with R LE with foot on wash clothe on floor, able to beging LAQ and quad sets    General Comments General comments (skin integrity, edema, etc.): pt with R knee dressing and splint and ace wrap on L forearm. Pt assisted to [facility], upon return pt had performed pericare however unable to tell me how as PT didn't leave toliet paper or wash cloth for her at bedside      Pertinent Vitals/Pain Pain Assessment: Faces Faces Pain Scale: Hurts whole lot Pain Location: R LE/knee during mobility and ROM Pain Descriptors / Indicators: Grimacing;Guarding;Sharp;Operative site guarding Pain Intervention(s): Limited activity within patient's tolerance    Home Living                      Prior Function            PT Goals (current goals can now be found in the care plan section) Acute Rehab PT Goals Patient Stated Goal: go home PT Goal Formulation: With patient Time For Goal Achievement: 03/27/21 Potential to Achieve Goals: Good Progress towards PT goals: Progressing toward goals    Frequency    Min 4X/week      PT Plan Current plan remains appropriate    Co-evaluation              AM-PAC PT "6 Clicks" Mobility   Outcome Measure  Help needed turning from your back to your side while in a flat bed without using bedrails?: A Little Help needed moving from lying on your back to sitting on the side of a flat bed without using bedrails?: A Little Help needed moving to and from a bed to a chair (including a wheelchair)?: A Little Help needed standing up from a chair using your arms (e.g., wheelchair or bedside chair)?: A Little Help needed to walk in hospital room?: A Little Help needed climbing 3-5 steps with a railing? : A Little 6 Click Score: 18    End of Session Equipment Utilized During Treatment: Gait belt Activity Tolerance: Patient limited by  pain Patient left: in chair;with call bell/phone within reach;with chair alarm set Nurse Communication: Mobility status PT Visit Diagnosis: Unsteadiness on feet (R26.81);Muscle weakness (generalized) (M62.81);Difficulty in walking, not elsewhere classified (R26.2);Other symptoms and signs involving the nervous system (R29.898)     Time: 4132-4401 PT Time Calculation (min) (ACUTE ONLY): 58 min  Charges:  $Gait Training: 8-22 mins $Therapeutic Exercise: 8-22 mins $Therapeutic Activity: 23-37 mins                     Kittie Plater, PT, DPT Acute Rehabilitation Services Pager #: [phone] Office #: [phone]    Berline Lopes 03/14/2021, 8:31 AM

## 2021-03-15 LAB — CBC
HCT: 32.9 % — ABNORMAL LOW (ref 36.0–46.0)
Hemoglobin: 11 g/dL — ABNORMAL LOW (ref 12.0–15.0)
MCH: 31.4 pg (ref 26.0–34.0)
MCHC: 33.4 g/dL (ref 30.0–36.0)
MCV: 94 fL (ref 80.0–100.0)
Platelets: 314 10*3/uL (ref 150–400)
RBC: 3.5 MIL/uL — ABNORMAL LOW (ref 3.87–5.11)
RDW: 14.2 % (ref 11.5–15.5)
WBC: 8.4 10*3/uL (ref 4.0–10.5)
nRBC: 0 % (ref 0.0–0.2)

## 2021-03-15 LAB — BASIC METABOLIC PANEL
Anion gap: 10 (ref 5–15)
BUN: 15 mg/dL (ref 8–23)
CO2: 30 mmol/L (ref 22–32)
Calcium: 8.9 mg/dL (ref 8.9–10.3)
Chloride: 96 mmol/L — ABNORMAL LOW (ref 98–111)
Creatinine, Ser: 0.68 mg/dL (ref 0.44–1.00)
GFR, Estimated: >60 mL/min (ref >60)
Glucose, Bld: 86 mg/dL (ref 70–99)
Potassium: 3.6 mmol/L (ref 3.5–5.1)
Sodium: 136 mmol/L (ref 135–145)

## 2021-03-15 MED ORDER — HYDROCODONE-ACETAMINOPHEN 5-325 MG PO TABS: 1.0000 | ORAL_TABLET | ORAL | 0 refills | Status: AC | PRN

## 2021-03-15 MED ORDER — SACCHAROMYCES BOULARDII 250 MG PO CAPS: 250.0000 mg | ORAL_CAPSULE | Freq: Two times a day (BID) | ORAL | 0 refills | Status: AC

## 2021-03-15 MED ORDER — SODIUM CHLORIDE 0.9% FLUSH: 10.0000 mL | INTRAVENOUS | Status: DC | PRN

## 2021-03-15 MED ORDER — HEPARIN SOD (PORK) LOCK FLUSH 100 UNIT/ML IV SOLN
250.0000 [IU] | INTRAVENOUS | Status: AC | PRN
  Administered 2021-03-15: 250 [IU]
  Filled 2021-03-15: qty 2.5

## 2021-03-15 MED ORDER — CHLORHEXIDINE GLUCONATE CLOTH 2 % EX PADS
6.0000 | MEDICATED_PAD | Freq: Every day | CUTANEOUS | Status: DC
  Administered 2021-03-15: 6 via TOPICAL

## 2021-03-15 NOTE — Progress Notes (Signed)
Physical Therapy Treatment Patient Details Name: Marie Lyons MRN: [id number] DOB: [birth year] Today's Date: 03/15/2021    History of Present Illness 71 y.o. female with medical history significant for COPD, depression, history of CVA, chronic back pain, and hypertension, presenting with dog bite with left wrist and forearm.  She underwent:Incision and drainage of left distal forearm, wrist deep abscess.  Incision and drainage left hand subcutaneous palmar infection.  Incision and drainage left dorsal forearm subcutaneous infection. Pt now also  s/p right knee arthroscopic irrigation and drainage with placement of antibiotic beads.    PT Comments    Pt much improved today from mobility, ambulation, and pain stand point. Pt with improved ability to manage platform walker and ambulate in fluid step through gait pattern without stopping for rest breaks. Pt given HEP from med bridge including progressive knee exercises. Pt with good understanding and return demonstration. Pt reports that her son Veleta Miners, will be there in the evening, her daughter will be there tomorrow and Friday and then her son will be there over the weekend. Continue to recommend HHPT to progress R knee exercises and progress to ambulation without AD. Acute PT to cont to follow.    Follow Up Recommendations  Home health PT;Supervision/Assistance - 24 hour     Equipment Recommendations  Rolling walker with 5" wheels (L platform walker - youth)    Recommendations for Other Services       Precautions / Restrictions Precautions Precautions: Fall Restrictions Weight Bearing Restrictions: Yes LUE Weight Bearing: Weight bear through elbow only RLE Weight Bearing: Weight bearing as tolerated Other Position/Activity Restrictions: L wrist/hand splint    Mobility  Bed Mobility Overal bed mobility: Modified Independent Bed Mobility: Supine to Sit     Supine to sit: Modified independent (Device/Increase time)     General bed  mobility comments: HOB elevated, able to bring LEs off EOB, no physical assist needed    Transfers Overall transfer level: Needs assistance Equipment used: Rolling walker (2 wheeled) Transfers: Sit to/from Stand Sit to Stand: Min guard         General transfer comment: min guard for safety to get set up in platform walker, verbal cues not to push up with L hand, assist with strap on L UE  Ambulation/Gait Ambulation/Gait assistance: Min guard Gait Distance (Feet): 150 Feet Assistive device: Left platform walker Gait Pattern/deviations: Step-through pattern;Decreased stride length;Decreased weight shift to right;Narrow base of support Gait velocity: dec Gait velocity interpretation: <1.31 ft/sec, indicative of household ambulator General Gait Details: pt with improved fluidity of gait pattern and increased speed, no required standing rest break, improved R LE WBing tolerance, pt with improved ability to push platform walker with L arm under strap   Stairs             Wheelchair Mobility    Modified Rankin (Stroke Patients Only)       Balance Overall balance assessment: Needs assistance Sitting-balance support: No upper extremity supported Sitting balance-Leahy Scale: Good     Standing balance support: Single extremity supported Standing balance-Leahy Scale: Fair Standing balance comment: pt with improved standing balance                            Cognition Arousal/Alertness: Awake/alert Behavior During Therapy: WFL for tasks assessed/performed Overall Cognitive Status: Within Functional Limits for tasks assessed  Exercises General Exercises - Lower Extremity Ankle Circles/Pumps: AROM;Both;10 reps;Seated (with LEs elevated) Quad Sets: AROM;Right;10 reps;Seated (with LEs elevated) Long Arc Quad: AROM;Right;10 reps;Seated Heel Slides: AROM;Right;10 reps;Seated (with foot on wash cloth) Straight  Leg Raises:  (demod on L LE as progressive exercise)    General Comments General comments (skin integrity, edema, etc.): VSS      Pertinent Vitals/Pain Pain Assessment: Faces Faces Pain Scale: Hurts even more Pain Location: R LE/knee during amb and exercises Pain Descriptors / Indicators: Grimacing;Guarding;Sharp;Operative site guarding Pain Intervention(s): Limited activity within patient's tolerance    Home Living                      Prior Function            PT Goals (current goals can now be found in the care plan section) Progress towards PT goals: Progressing toward goals    Frequency    Min 4X/week      PT Plan Current plan remains appropriate    Co-evaluation              AM-PAC PT "6 Clicks" Mobility   Outcome Measure  Help needed turning from your back to your side while in a flat bed without using bedrails?: A Little Help needed moving from lying on your back to sitting on the side of a flat bed without using bedrails?: A Little Help needed moving to and from a bed to a chair (including a wheelchair)?: A Little Help needed standing up from a chair using your arms (e.g., wheelchair or bedside chair)?: A Little Help needed to walk in hospital room?: A Little Help needed climbing 3-5 steps with a railing? : A Little 6 Click Score: 18    End of Session Equipment Utilized During Treatment: Gait belt Activity Tolerance: Patient limited by pain Patient left: in chair;with call bell/phone within reach;with chair alarm set Nurse Communication: Mobility status PT Visit Diagnosis: Unsteadiness on feet (R26.81);Muscle weakness (generalized) (M62.81);Difficulty in walking, not elsewhere classified (R26.2);Other symptoms and signs involving the nervous system (R29.898)     Time: 7371-0626 PT Time Calculation (min) (ACUTE ONLY): 32 min  Charges:  $Gait Training: 8-22 mins $Therapeutic Exercise: 8-22 mins                     Kittie Plater, PT,  DPT Acute Rehabilitation Services Pager #: [phone] Office #: [phone]    Berline Lopes 03/15/2021, 12:11 PM

## 2021-03-15 NOTE — TOC Transition Note (Signed)
Transition of Care [facility]) - CM/SW Discharge Note   Patient Details  Name: Marie Lyons MRN: [id number] Date of Birth: 06/27/1950  Transition of Care [facility]) CM/SW Contact:  Sharin Mons, RN Phone Number: 03/15/2021, 3:17 PM   Clinical Narrative:    Presented with dog bite with L wrist and forearm redness. From home alone. Supportive family.          - s/p Incision and drainage of left distal forearm,3/10          - s/p  Right knee arthroscopy with washout and debridement,               Per ID pt will need LT IV ABX therapy until 04/09/21  Pam with [facility] to provide teaching to pt and son prior to d/c. Pt will completed IV ABX therapy today while in hospital. [facility] with home services will being 3/34. [facility] aware of d/c plan. Referral made [employer] for L arm platform walker. Equipment will be delivered to bedside prior to d/c.  Pt without Rx med concerns.  Post hospital f/u noted on AVS.                                                                 Son Dominica Severin to provide transportation to home.                                       Final next level of care: [place]     Patient Goals and CMS Choice     Choice offered to / list presented to : Patient  Discharge Placement                       Discharge Plan and Services   Discharge Planning Services: CM Consult            DME Arranged: Gilford Rile platform ([place]) DME Agency: [employer] Date DME Agency Contacted: 03/15/21 Time DME Agency Contacted: [phone] Representative spoke with at DME Agency: Freda Munro [place]: PT,OT,RN [place]: [place] Date [place]: 03/15/21 Time [place]: 66 Representative spoke with at [place]: [place] ([place]) Interventions     Readmission Risk Interventions No flowsheet data found.

## 2021-03-15 NOTE — Progress Notes (Signed)
Peripherally Inserted Central Catheter Placement  The IV Nurse has discussed with the patient and/or persons authorized to consent for the patient, the purpose of this procedure and the potential benefits and risks involved with this procedure.  The benefits include less needle sticks, lab draws from the catheter, and the patient may be discharged home with the catheter. Risks include, but not limited to, infection, bleeding, blood clot (thrombus formation), and puncture of an artery; nerve damage and irregular heartbeat and possibility to perform a PICC exchange if needed/ordered by physician.  Alternatives to this procedure were also discussed.  Bard Power PICC patient education guide, fact sheet on infection prevention and patient information card has been provided to patient /or left at bedside.    PICC Placement Documentation  PICC Single Lumen 29/84/73 PICC Right Basilic 35 cm 0 cm (Active)  Indication for Insertion or Continuance of Line Home intravenous therapies (PICC only) 03/15/21 1452  Exposed Catheter (cm) 0 cm 03/15/21 1452  Site Assessment Clean;Dry;Intact 03/15/21 1452  Line Status Flushed;Blood return noted 03/15/21 1452  Dressing Type Transparent 03/15/21 1452  Dressing Status Clean;Dry;Intact 03/15/21 1452  Antimicrobial disc in place? Yes 03/15/21 1452  Dressing Change Due 03/22/21 03/15/21 1452       Scotty Court 03/15/2021, 2:54 PM

## 2021-03-15 NOTE — Progress Notes (Signed)
Pat came by to do teaching with the patient about home IV antibiotic therapy. AVS discharge summary was given to the patient and went over with her and son. Pt had no further questions. IV team order was placed to flush and cap PICC line for home. Rolling walker with arm rest is in room going home with the patient.

## 2021-03-15 NOTE — Progress Notes (Signed)
Occupational Therapy Treatment Patient Details Name: Marie Lyons MRN: [id number] DOB: December 22, 1950 Today's Date: 03/15/2021    History of present illness 71 y.o. female with medical history significant for COPD, depression, history of CVA, chronic back pain, and hypertension, presenting with dog bite with left wrist and forearm.  She underwent:Incision and drainage of left distal forearm, wrist deep abscess.  Incision and drainage left hand subcutaneous palmar infection.  Incision and drainage left dorsal forearm subcutaneous infection. Pt now also  s/p right knee arthroscopic irrigation and drainage with placement of antibiotic beads.   OT comments  Pt making good progress with functional goals. OT will continue to follow acutely to maximize level of function and safety  Follow Up Recommendations  Home health OT;Supervision/Assistance - 24 hour;Follow surgeon's recommendation for DC plan and follow-up therapies    Equipment Recommendations  Other (comment);3 in 1 bedside commode;Tub/shower seat (reacher, L PFRW)    Recommendations for Other Services      Precautions / Restrictions Precautions Precautions: Fall Restrictions Weight Bearing Restrictions: Yes LUE Weight Bearing: Non weight bearing (WB through elbow only) RLE Weight Bearing: Weight bearing as tolerated Other Position/Activity Restrictions: L wrist/hand splint       Mobility Bed Mobility               General bed mobility comments: pt in recliner upon arrival    Transfers Overall transfer level: Needs assistance Equipment used: Rolling walker (2 wheeled) (PFRW) Transfers: Sit to/from Stand Sit to Stand: Min guard         General transfer comment: min guard for safety to get set up in platform walker, verbal cues not to push up with L hand, assist with strap on L UE    Balance Overall balance assessment: Needs assistance Sitting-balance support: No upper extremity supported Sitting balance-Leahy  Scale: Good     Standing balance support: Single extremity supported;During functional activity Standing balance-Leahy Scale: Fair                             ADL either performed or assessed with clinical judgement   ADL Overall ADL's : Needs assistance/impaired     Grooming: Min guard;Wash/dry hands;Wash/dry face;Standing       Lower Body Bathing: Minimal assistance Lower Body Bathing Details (indicate cue type and reason): simulated seated Upper Body Dressing : Set up;Sitting       Toilet Transfer: Min guard;Ambulation;Comfort height toilet;Grab bars   Toileting- Clothing Manipulation and Hygiene: Min guard;Sit to/from stand       Functional mobility during ADLs: Min guard;Cueing for safety;Rolling walker       Vision Patient Visual Report: No change from baseline     Perception     Praxis      Cognition Arousal/Alertness: Awake/alert Behavior During Therapy: WFL for tasks assessed/performed Overall Cognitive Status: Within Functional Limits for tasks assessed                                          Exercises     Shoulder Instructions       General Comments      Pertinent Vitals/ Pain       Pain Assessment: 0-10 Pain Score: 5  Pain Location: R LE/knee during amb and exercises Pain Descriptors / Indicators: Grimacing;Guarding;Sharp;Operative site guarding Pain Intervention(s): Monitored during session;Premedicated before session;Repositioned  Home  Living                                          Prior Functioning/Environment              Frequency  Min 2X/week        Progress Toward Goals  OT Goals(current goals can now be found in the care plan section)  Progress towards OT goals: Progressing toward goals  Acute Rehab OT Goals Patient Stated Goal: go home  Plan Discharge plan remains appropriate    Co-evaluation                 AM-PAC OT "6 Clicks" Daily Activity      Outcome Measure   Help from another person eating meals?: None Help from another person taking care of personal grooming?: A Little Help from another person toileting, which includes using toliet, bedpan, or urinal?: A Little Help from another person bathing (including washing, rinsing, drying)?: A Lot Help from another person to put on and taking off regular upper body clothing?: A Little Help from another person to put on and taking off regular lower body clothing?: A Lot 6 Click Score: 17    End of Session Equipment Utilized During Treatment: Gait belt;Other (comment) (PFRW)  OT Visit Diagnosis: Unsteadiness on feet (R26.81);Other abnormalities of gait and mobility (R26.89);Muscle weakness (generalized) (M62.81);Pain Pain - Right/Left: Left Pain - part of body: Knee;Hand   Activity Tolerance Patient tolerated treatment well   Patient Left with call bell/phone within reach;in chair;with chair alarm set   Nurse Communication          Time: 8675-4492 OT Time Calculation (min): 27 min  Charges: OT General Charges $OT Visit: 1 Visit OT Treatments $Self Care/Home Management : 8-22 mins $Therapeutic Activity: 8-22 mins     Britt Bottom 03/15/2021, 4:17 PM

## 2021-03-15 NOTE — Care Management Important Message (Signed)
Important Message  Patient Details  Name: Marie Lyons MRN: [id number] Date of Birth: 10/15/1950   Medicare Important Message Given:  Yes - Important Message mailed due to current National Emergency   Verbal consent obtained due to current National Emergency  Relationship to patient: Self Contact Name: Eleshia Call Date: 03/15/21  Time: 1354 Phone: [phone] Outcome: No Answer/Busy Important Message mailed to: Patient address on file    Delorse Lek 03/15/2021, 1:54 PM

## 2021-03-15 NOTE — Plan of Care (Signed)
  Problem: Education: Goal: Knowledge of General Education information will improve Description: Including pain rating scale, medication(s)/side effects and non-pharmacologic comfort measures Outcome: Adequate for Discharge   Problem: Health Behavior/Discharge Planning: Goal: Ability to manage health-related needs will improve Outcome: Adequate for Discharge   Problem: Clinical Measurements: Goal: Ability to maintain clinical measurements within normal limits will improve Outcome: Adequate for Discharge Goal: Will remain free from infection Outcome: Adequate for Discharge Goal: Diagnostic test results will improve Outcome: Adequate for Discharge Goal: Respiratory complications will improve Outcome: Adequate for Discharge Goal: Cardiovascular complication will be avoided Outcome: Adequate for Discharge   Problem: Activity: Goal: Risk for activity intolerance will decrease Outcome: Adequate for Discharge   Problem: Nutrition: Goal: Adequate nutrition will be maintained Outcome: Adequate for Discharge   Problem: Coping: Goal: Level of anxiety will decrease Outcome: Adequate for Discharge   Problem: Elimination: Goal: Will not experience complications related to bowel motility Outcome: Adequate for Discharge Goal: Will not experience complications related to urinary retention Outcome: Adequate for Discharge   Problem: Pain Managment: Goal: General experience of comfort will improve Outcome: Adequate for Discharge   Problem: Safety: Goal: Ability to remain free from injury will improve Outcome: Adequate for Discharge   Problem: Skin Integrity: Goal: Risk for impaired skin integrity will decrease Outcome: Adequate for Discharge   Problem: Inadequate Intake (NI-2.1) Goal: Food and/or nutrient delivery Description: Individualized approach for food/nutrient provision. Outcome: Adequate for Discharge   Problem: Acute Rehab OT Goals (only OT should resolve) Goal: Pt.  Will Perform Grooming Outcome: Adequate for Discharge Goal: Pt. Will Perform Upper Body Bathing Outcome: Adequate for Discharge Goal: Pt. Will Perform Lower Body Bathing Outcome: Adequate for Discharge Goal: Pt. Will Perform Upper Body Dressing Outcome: Adequate for Discharge Goal: Pt. Will Perform Lower Body Dressing Outcome: Adequate for Discharge Goal: Pt. Will Transfer To Toilet Outcome: Adequate for Discharge Goal: Pt. Will Perform Toileting-Clothing Manipulation Outcome: Adequate for Discharge   Problem: Acute Rehab PT Goals(only PT should resolve) Goal: Pt Will Go Supine/Side To Sit Outcome: Adequate for Discharge Goal: Patient Will Transfer Sit To/From Stand Outcome: Adequate for Discharge Goal: Pt Will Ambulate Outcome: Adequate for Discharge Goal: Pt/caregiver will Perform Home Exercise Program Outcome: Adequate for Discharge

## 2021-03-15 NOTE — Discharge Summary (Signed)
Physician Discharge Summary  Marie Lyons [id number] DOB: 06-07-1950 DOA: 03/08/2021  PCP: Maury Dus, MD  Admit date: 03/08/2021 Discharge date: 03/15/2021  Admitted From: Home Disposition: Home   Recommendations for Outpatient Follow-up:  1. Follow up with PCP in 1-2 weeks 2. Follow up with orthopedics, Dr. Caralyn Guile on the day after discharge for LUE and Dr. Marlou Sa in 1 week for the right knee.  3. Continue IV antibiotics for 4 weeks and follow up with the infectious disease clinic as scheduled with Dr. Juleen China for the infections in the left arm and right knee.   Home Health: PT, OT, RN Equipment/Devices: As ordered Discharge Condition: Stable CODE STATUS: Full Diet recommendation: Heart healthy  Brief/Interim Summary: 71 year old F with PMH of COPD not on oxygen, CVA, chronic back pain, HTN and depression presenting to ED for evaluation of progressive left wrist and forearm pain, redness and swelling after dog bite on 03/06/2021. She underwent I&D by hand surgery, Dr. Erskine Speed on 03/09/2021.   Patientdeveloped acuteright kneepain with effusionanddifficulty weightbearing on 3/12.X-ray and USshowed a complex right knee effusion.Underwent needle aspiration by Ortho.Although fluid study does not suggest infection, concern for septic arthritis remained high asshe was on IV antibiotic prior to aspiration. Underwent arthroscopic washout on 3/13. She will continue IV antibiotics as below per ID recommendations for 4 weeks.   Discharge Diagnoses:  Principal Problem:   Infected dog bite of hand Active Problems:   Essential hypertension   COPD (chronic obstructive pulmonary disease) (HCC)   History of CVA (cerebrovascular accident)   Dog bite of left wrist with infection   Hand abscess   Dog bite  Infected dog bite involving Left Hand, Wrist, Forearm -  S/p incision and drainage by Dr. Apolonio Schneiders on 03/09/2021. Surgical culture Pasteurella multocida. --Continue invanz and  daptomycin per ID recommendations. Marland Kitchen --Follow up with Dr. Jori Moll on 03/16/21 at 9:30 AM.   Call if need to reschedule (original f/u was 3/14 but pt still admitted) --Continue splint 24/7 until outpatient follow-up --Ice and elevate  Acute Right Knee Effusion - Septic Arthritis - joint aspirate culture without growth, high wbc's and pt was previously on antibiotics.  No trauma or injury.  ?seeded infection from above.  Status post arthroscopic washout on 3/13. --ID, Orthopedics following  --Per Ortho: WBAT on RLE --Ortho follow up with Dr. Marlou Sa in 7-10 days --PT: [facility] recommended with 24 hr assistance --Pain control PRN  Hypokalemia - Resolved.  Monitor BMP. Replace as needed.  Mild hyponatremia - mild, Na134.  Monitor BMP.  COPD - not acutely exacerbated, not wheezing.  Continue PRN Albuterol.  Hypertension - chronic, stable. Continue metoprolol.  History of CVA - without residual deficits.  --Not on ASA due to reported intolerance, hx of ulcers.  Depression - stable. Continue Lexapro.  Iron deficiency anemia: H&H stable. Iron sat 5%. Folic acid deficiency --Status post IV Ferahemeon 3/12 --Continue oral folic acidsupplement  Underweight - Body mass index is 18.07 kg/m.  Discharge Instructions Discharge Instructions    [facility] pharmacist to adjust dose for Vancomycin, Aminoglycosides and other anti-infective therapies as requested by physician.   Complete by: As directed    [facility] to provide Cath Flo 5m   Complete by: As directed    Administer for PICC line occlusion and as ordered by physician for other access device issues.   Anaphylaxis Kit: Provided to treat any anaphylactic reaction to the medication being provided to the patient if First Dose or when requested by physician  Complete by: As directed    Epinephrine 38m/ml vial / amp: Administer 0.341m(0.76m9msubcutaneously once for moderate to severe anaphylaxis, nurse to call  physician and pharmacy when reaction occurs and call 911 if needed for immediate care   Diphenhydramine 84m776m IV vial: Administer 25-84mg18mIM PRN for first dose reaction, rash, itching, mild reaction, nurse to call physician and pharmacy when reaction occurs   Sodium Chloride 0.9% NS 500ml 38mAdminister if needed for hypovolemic blood pressure drop or as ordered by physician after call to physician with anaphylactic reaction   Call MD for:  redness, tenderness, or signs of infection (pain, swelling, redness, odor or green/yellow discharge around incision site)   Complete by: As directed    Call MD for:  severe uncontrolled pain   Complete by: As directed    Call MD for:  temperature >100.4   Complete by: As directed    Change dressing on IV access line weekly and PRN   Complete by: As directed    Diet - low sodium heart healthy   Complete by: As directed    Diet general   Complete by: As directed    Discharge instructions   Complete by: As directed    It has been a pleasure taking care of you!  You were hospitalized due to left hand, wrist and forearm infection after dog bite.  You were treated surgically and medically.  We are discharging you more antibiotics to continue treatment course.  Continue wearing the splint.  Please follow-up with your surgeon on 03/13/2021.   Take care,   Discharge instructions   Complete by: As directed    You will continue IV antibiotics for 4 weeks and follow up with the infectious disease clinic as scheduled with Dr. WallacJuleen Chinahe infections in the left arm and right knee.   Please take probiotics, florastor was sent to your pharmacy. To help with antibiotic-associated loose stools.   Continue taking medications as prescribed by orthopedics for pain control as needed, and follow up with Dr. OrtmanCaralyn Guilerow. Also please call Dr. Dean'sRandel Pigge today to schedule an appointment in about 7 days to follow up the right knee.   Seek medical attention  right away if you notice increased swelling, pain, redness, or warmth or if you develop a fever.   Flush IV access with Sodium Chloride 0.9% and Heparin 10 units/ml or 100 units/ml   Complete by: As directed    Home infusion instructions - [facility]   Complete by: As directed    Instructions: Flush IV access with Sodium Chloride 0.9% and Heparin 10units/ml or 100units/ml   Change dressing on IV access line: Weekly and PRN   Instructions Cath Flo 2mg: A25mnister for PICC Line occlusion and as ordered by physician for other access device   [facility] pharmacist to adjust dose for: Vancomycin, Aminoglycosides and other anti-infective therapies as requested by physician   Increase activity slowly   Complete by: As directed    Increase activity slowly   Complete by: As directed    Leave dressing on - Keep it clean, dry, and intact until clinic visit   Complete by: As directed    Method of administration may be changed at the discretion of home infusion pharmacist based upon assessment of the patient and/or caregiver's ability to self-administer the medication ordered   Complete by: As directed    No wound care   Complete by: As directed  Allergies as of 03/15/2021      Reactions   Ciprofloxacin Shortness Of Breath   Denies this allergy on 09/23/16 (??)   Keflex [cephalexin] Anaphylaxis   Penicillins Anaphylaxis   Has patient had a PCN reaction causing immediate rash, facial/tongue/throat swelling, SOB or lightheadedness with hypotension: Yes Has patient had a PCN reaction causing severe rash involving mucus membranes or skin necrosis: Yes Has patient had a PCN reaction that required hospitalization Yes Has patient had a PCN reaction occurring within the last 10 years: No If all of the above answers are "NO", then may proceed with Cephalosporin use.   Aspirin Other (See Comments)   Aggravates ulcer   Darvon [propoxyphene Hcl] Other (See Comments)   Wheezing    Ibuprofen Other (See Comments)   Wheezing   Imitrex [sumatriptan] Other (See Comments)   Contraindicated because pt had a stroke   Meperidine Other (See Comments)   Unknown reaction (per patient)   Monosodium Glutamate Other (See Comments)   Migraine    Nitrates, Organic Other (See Comments)   Migraine    Nsaids Other (See Comments)   Aggravates ulcer   Sulfa Antibiotics Other (See Comments)   Unknown reaction (per patient)      Medication List    STOP taking these medications   simvastatin 20 MG tablet Commonly known as: ZOCOR     TAKE these medications   acetaminophen 500 MG tablet Commonly known as: TYLENOL Take 500 mg by mouth every 6 (six) hours as needed for headache (or pain).   ARTIFICIAL TEAR OP Place 1 drop into both eyes daily.   calcium elemental as carbonate 400 MG chewable tablet Commonly known as: BARIATRIC TUMS ULTRA Chew 1,000 mg by mouth daily as needed for heartburn.   cholecalciferol 1000 units tablet Commonly known as: VITAMIN D Take 1,000 Units by mouth daily.   cyclobenzaprine 10 MG tablet Commonly known as: FLEXERIL Take 10 mg by mouth at bedtime.   daptomycin  IVPB Commonly known as: CUBICIN Inject 400 mg into the vein daily for 26 days. Indication:  Pasturella infection of left wrist and culture negative septic arthritis of right knee First Dose: No Last Day of Therapy:  04/09/2021 Labs - Once weekly:  CBC/D, BMP, and CPK Labs - Every other week:  ESR and CRP Method of administration: IV Push Method of administration may be changed at the discretion of home infusion pharmacist based upon assessment of the patient and/or caregiver's ability to self-administer the medication ordered.   diphenhydrAMINE 25 MG tablet Commonly known as: BENADRYL Take 1 tablet (25 mg total) by mouth every 6 (six) hours as needed. What changed: reasons to take this   EPINEPHrine 0.3 mg/0.3 mL Soaj injection Commonly known as: EPI-PEN Inject 0.3 mg into the  muscle as needed for anaphylaxis.   ertapenem  IVPB Commonly known as: INVANZ Inject 1 g into the vein daily for 26 days. Indication:  Pasturella infection of left wrist and culture negative septic arthritis of right knee First Dose: No Last Day of Therapy:  04/09/2021 Labs - Once weekly:  CBC/D and BMP, Labs - Every other week:  ESR and CRP Method of administration: Mini-Bag Plus / Gravity Method of administration may be changed at the discretion of home infusion pharmacist based upon assessment of the patient and/or caregiver's ability to self-administer the medication ordered.   escitalopram 20 MG tablet Commonly known as: LEXAPRO Take 20 mg by mouth daily.   estradiol 0.1 MG/24HR patch Commonly known  as: VIVELLE-DOT Place 1 patch onto the skin once a week.   fluticasone 50 MCG/ACT nasal spray Commonly known as: FLONASE Place 2 sprays into the nose daily.   folic acid 1 MG tablet Commonly known as: FOLVITE Take 1 tablet (1 mg total) by mouth daily.   HYDROcodone-acetaminophen 5-325 MG tablet Commonly known as: NORCO/VICODIN Take 1 tablet by mouth every 6 (six) hours as needed for moderate pain.   Melatonin 10 MG Tabs Take 1 tablet by mouth at bedtime.   metoprolol succinate 25 MG 24 hr tablet Commonly known as: TOPROL-XL Take 25 mg by mouth at bedtime.   multivitamin with minerals Tabs tablet Take 1 tablet by mouth daily.   saccharomyces boulardii 250 MG capsule Commonly known as: Florastor Take 1 capsule (250 mg total) by mouth 2 (two) times daily.   Ventolin HFA 108 (90 Base) MCG/ACT inhaler Generic drug: albuterol Inhale 2 puffs into the lungs every 4 (four) hours as needed for wheezing or shortness of breath.   vitamin C 250 MG tablet Commonly known as: ASCORBIC ACID Take 250 mg by mouth daily.            Discharge Care Instructions  (From admission, onward)         Start     Ordered   03/15/21 0000  Leave dressing on - Keep it clean, dry, and  intact until clinic visit        03/15/21 1003   03/14/21 0000  Change dressing on IV access line weekly and PRN  (Home infusion instructions - [facility] )        03/14/21 1351          Follow-up Information    Maury Dus, MD. Schedule an appointment as soon as possible for a visit in 1 week(s).   Specialty: Family Medicine Contact information: [address] [place] [address] [phone]        Iran Planas, MD. Schedule an appointment as soon as possible for a visit in 2 day(s).   Specialty: Orthopedic Surgery Why: Appt scheduled for 03/16/21 at 9:30 AM - call if needed to reschedule. Contact information: [address] [place] [address] [phone]        Mignon Pine, DO Follow up.   Specialties: Infectious Diseases, Internal Medicine Why: 2pm on 3/28. Please call to reschedule if you are unable to make this appointment.  Contact information: [address] [place] [address] [phone]        Meredith Pel, MD Follow up.   Specialty: Orthopedic Surgery Why: Follow-up with Dr. Marlou Sa ~7-10 days after surgery. call to make appointment Contact information: [address] [place] [address] [phone]              Allergies  Allergen Reactions  . Ciprofloxacin Shortness Of Breath    Denies this allergy on 09/23/16 (??)  . Keflex [Cephalexin] Anaphylaxis  . Penicillins Anaphylaxis    Has patient had a PCN reaction causing immediate rash, facial/tongue/throat swelling, SOB or lightheadedness with hypotension: Yes Has patient had a PCN reaction causing severe rash involving mucus membranes or skin necrosis: Yes Has patient had a PCN reaction that required hospitalization Yes Has patient had a PCN reaction occurring within the last 10 years: No If all of the above answers are "NO", then may proceed with Cephalosporin use.   . Aspirin Other (See Comments)    Aggravates  ulcer  .  Darvon [Propoxyphene Hcl] Other (See Comments)    Wheezing  . Ibuprofen Other (See Comments)    Wheezing  . Imitrex [Sumatriptan] Other (See Comments)    Contraindicated because pt had a stroke  . Meperidine Other (See Comments)    Unknown reaction (per patient)  . Monosodium Glutamate Other (See Comments)    Migraine   . Nitrates, Organic Other (See Comments)    Migraine   . Nsaids Other (See Comments)    Aggravates ulcer  . Sulfa Antibiotics Other (See Comments)    Unknown reaction (per patient)    Consultations:  Orthopedics  ID  Procedures/Studies: DG Chest 2 View  Result Date: 03/08/2021 CLINICAL DATA:  Wheezing and cough. EXAM: CHEST - 2 VIEW COMPARISON:  November 18, 2020 FINDINGS: The lungs are hyperinflated. Mild, diffuse, chronic appearing increased interstitial lung markings are seen. Mild areas of scarring and/or atelectasis are seen within the bilateral lung bases. This is decreased in severity when compared to the prior study. There is no evidence of a pleural effusion or pneumothorax. The heart size and mediastinal contours are within normal limits. Moderate to marked severity S-shape scoliosis of the thoracic spine is seen. IMPRESSION: COPD with mild bibasilar scarring and/or atelectasis. Electronically Signed   By: Virgina Norfolk M.D.   On: 03/08/2021 16:14   DG Wrist Complete Left  Result Date: 03/08/2021 CLINICAL DATA:  Status post dog bite to the left forearm/hand. EXAM: LEFT WRIST - COMPLETE 3+ VIEW COMPARISON:  None. FINDINGS: There is no evidence of an acute fracture or dislocation. Moderate to marked severity degenerative changes an adjacent soft tissue calcification are seen along the radiocarpal joint and carpometacarpal articulation of the left thumb. Mild diffuse soft tissue swelling is seen. IMPRESSION: Chronic and degenerative changes without evidence of acute fracture. Electronically Signed   By: Virgina Norfolk M.D.   On: 03/08/2021 16:20    DG Hand Complete Left  Result Date: 03/08/2021 CLINICAL DATA:  Status post dog bite to the left forearm/hand. EXAM: LEFT HAND - COMPLETE 3+ VIEW COMPARISON:  February 27, 2018 FINDINGS: There is no evidence of acute fracture or dislocation. Stable chronic and postoperative changes are seen involving the distal aspects of the second and third left metacarpals and adjacent MCP joints. A radiopaque surgical screw is again seen overlying the middle and distal phalanges of the fifth left finger. Degenerative changes are seen involving numerous interphalangeal joints, the left wrist and the carpometacarpal articulation of the left thumb. Soft tissue swelling is seen adjacent to the first metacarpophalangeal articulation. IMPRESSION: Stable chronic and postoperative changes without evidence of acute osseous abnormality. Electronically Signed   By: Virgina Norfolk M.D.   On: 03/08/2021 16:18   [place] LT LOWER EXTREM LTD SOFT TISSUE NON VASCULAR  Result Date: 03/11/2021 CLINICAL DATA:  Right lower thigh pain and mass. EXAM: ULTRASOUND right LOWER EXTREMITY LIMITED TECHNIQUE: Ultrasound examination of the lower extremity soft tissues was performed in the area of clinical concern. COMPARISON:  Radiograph 03/11/2021 FINDINGS: There is a large complex suprapatellar knee joint effusion as demonstrated on the radiographs. There is also a moderate-sized Baker's cyst. IMPRESSION: Large complex suprapatellar knee joint effusion and moderate-sized Baker's cyst. MRI may be helpful to evaluate for internal derangement. Electronically Signed   By: Marijo Sanes M.D.   On: 03/11/2021 14:38   [place] EKG SITE RITE  Result Date: 03/14/2021 If Site Rite image not attached, placement could not be confirmed due to current cardiac rhythm.  DG KNEE 3 VIEW  RIGHT  Result Date: 03/11/2021 CLINICAL DATA:  Pain and swelling EXAM: RIGHT KNEE -4 VIEW COMPARISON:  None. FINDINGS: Frontal, lateral, and bilateral oblique views were obtained.  There is no fracture or dislocation. There is a sizable joint effusion. No appreciable joint space narrowing or erosion. There is atherosclerotic calcification in the distal superficial femoral and popliteal arteries. IMPRESSION: Sizable knee joint effusion. No fracture or dislocation. No joint space narrowing or erosion. Atherosclerotic arterial vascular calcification noted. Electronically Signed   By: Lowella Grip III M.D.   On: 03/11/2021 12:16     Wrist split applied  Left hand/wrist wash out   Right knee arthroscopic washout   Subjective: Pain is controlled. Has assistance at home. No fever. Getting around ok.   Discharge Exam: Vitals:   03/15/21 0512 03/15/21 0700  BP: 132/83 106/65  Pulse: 97   Resp: 16 16  Temp: 98.3 F (36.8 C) (!) 97.4 F (36.3 C)  SpO2: 92% 95%   General: Pt is alert, awake, not in acute distress Cardiovascular: RRR, S1/S2 +, no rubs, no gallops Respiratory: CTA bilaterally, no wheezing, no rhonchi Abdominal: Soft, NT, ND, bowel sounds + Extremities: Left wrist in splint and Ace wrap, right knee swelling and tenderness. Neurovascularly intact throughout.  Labs: BNP (last 3 results) No results for input(s): BNP in the last 8760 hours. Basic Metabolic Panel: Recent Labs  Lab 03/09/21 0255 03/10/21 0302 03/11/21 0251 03/12/21 0305 03/13/21 0400 03/15/21 0443  NA 134* 130* 136 134* 133* 136  K 3.5 4.0 3.2* 4.6 4.4 3.6  CL 99 96* 100 99 96* 96*  CO2 27 23 28 29 26 30   GLUCOSE 110* 117* 107* 85 136* 86  BUN 7* 9 8 <5* 9 15  CREATININE 0.62 0.65 0.64 0.54 0.52 0.68  CALCIUM 8.4* 8.0* 8.1* 8.5* 9.2 8.9  MG 1.7 1.6* 2.0 2.1 1.9  --   PHOS  --   --  3.7 3.5 4.3  --    Liver Function Tests: Recent Labs  Lab 03/08/21 1528 03/11/21 0251 03/12/21 0305 03/13/21 0400  AST 15  --   --   --   ALT 11  --   --   --   ALKPHOS 52  --   --   --   BILITOT 0.3  --   --   --   PROT 7.1  --   --   --   ALBUMIN 3.9 2.3* 2.4* 2.7*   No results  for input(s): LIPASE, AMYLASE in the last 168 hours. No results for input(s): AMMONIA in the last 168 hours. CBC: Recent Labs  Lab 03/08/21 1528 03/09/21 0255 03/10/21 1441 03/11/21 0251 03/12/21 0305 03/13/21 0400 03/15/21 0443  WBC 12.7* 11.9* 11.8* 7.0 7.8 7.1 8.4  NEUTROABS 10.6* 9.5* 9.7* 5.0  --   --   --   HGB 12.0 10.9* 10.7* 10.4* 11.6* 10.6* 11.0*  HCT 37.9 34.2* 33.9* 30.9* 34.8* 32.5* 32.9*  MCV 97.9 96.6 98.8 95.1 96.1 96.2 94.0  PLT 195 189 175 183 191 234 314   Cardiac Enzymes: Recent Labs  Lab 03/13/21 1611 03/14/21 0340  CKTOTAL 455* 425*   BNP: Invalid input(s): POCBNP CBG: No results for input(s): GLUCAP in the last 168 hours. D-Dimer No results for input(s): DDIMER in the last 72 hours. Hgb A1c No results for input(s): HGBA1C in the last 72 hours. Lipid Profile No results for input(s): CHOL, HDL, LDLCALC, TRIG, CHOLHDL, LDLDIRECT in the last 72 hours. Thyroid function studies  No results for input(s): TSH, T4TOTAL, T3FREE, THYROIDAB in the last 72 hours.  Invalid input(s): FREET3 Anemia work up No results for input(s): VITAMINB12, FOLATE, FERRITIN, TIBC, IRON, RETICCTPCT in the last 72 hours. Urinalysis    Component Value Date/Time   COLORURINE COLORLESS (A) 12/16/2020 1814   APPEARANCEUR CLEAR 12/16/2020 1814   LABSPEC 1.003 (L) 12/16/2020 1814   PHURINE 7.0 12/16/2020 1814   GLUCOSEU NEGATIVE 12/16/2020 1814   HGBUR NEGATIVE 12/16/2020 1814   BILIRUBINUR NEGATIVE 12/16/2020 1814   KETONESUR NEGATIVE 12/16/2020 1814   PROTEINUR NEGATIVE 12/16/2020 1814   UROBILINOGEN 1.0 09/09/2012 2015   NITRITE NEGATIVE 12/16/2020 1814   LEUKOCYTESUR NEGATIVE 12/16/2020 1814    Microbiology Recent Results (from the past 240 hour(s))  Culture, blood (routine x 2)     Status: None   Collection Time: 03/08/21  3:28 PM   Specimen: BLOOD RIGHT FOREARM  Result Value Ref Range Status   Specimen Description BLOOD RIGHT FOREARM  Final   Special Requests    Final    BOTTLES DRAWN AEROBIC AND ANAEROBIC Blood Culture adequate volume   Culture   Final    NO GROWTH 5 DAYS Performed at [facility], [address], [place], [place] [address]    Report Status 03/13/2021 FINAL  Final  Culture, blood (routine x 2)     Status: None   Collection Time: 03/08/21  3:31 PM   Specimen: BLOOD  Result Value Ref Range Status   Specimen Description BLOOD LEFT ANTECUBITAL  Final   Special Requests   Final    BOTTLES DRAWN AEROBIC AND ANAEROBIC Blood Culture adequate volume   Culture   Final    NO GROWTH 5 DAYS Performed at [facility], [place] [address], [place], [place] [address]    Report Status 03/13/2021 FINAL  Final  Resp Panel by RT-PCR (Flu A&B, Covid) Nasopharyngeal Swab     Status: None   Collection Time: 03/08/21  4:42 PM   Specimen: Nasopharyngeal Swab; Nasopharyngeal(NP) swabs in vial transport medium  Result Value Ref Range Status   SARS Coronavirus 2 by RT PCR NEGATIVE NEGATIVE Final    Comment: (NOTE) SARS-CoV-2 target nucleic acids are NOT DETECTED.  The SARS-CoV-2 RNA is generally detectable in upper respiratory specimens during the acute phase of infection. The lowest concentration of SARS-CoV-2 viral copies this assay can detect is 138 copies/mL. A negative result does not preclude SARS-Cov-2 infection and should not be used as the sole basis for treatment or other patient management decisions. A negative result may occur with  improper specimen collection/handling, submission of specimen other than nasopharyngeal swab, presence of viral mutation(s) within the areas targeted by this assay, and inadequate number of viral copies(<138 copies/mL). A negative result must be combined with clinical observations, patient history, and epidemiological information. The expected result is Negative.  Fact Sheet for Patients:  EntrepreneurPulse.com.au  Fact Sheet for Healthcare Providers:   IncredibleEmployment.be  This test is no t yet approved or cleared by the [place] FDA and  has been authorized for detection and/or diagnosis of SARS-CoV-2 by FDA under an Emergency Use Authorization (EUA). This EUA will remain  in effect (meaning this test can be used) for the duration of the COVID-19 declaration under Section 564(b)(1) of the Act, 21 U.S.C.section 360bbb-3(b)(1), unless the authorization is terminated  or revoked sooner.       Influenza A by PCR NEGATIVE NEGATIVE Final   Influenza B by PCR NEGATIVE NEGATIVE Final  Comment: (NOTE) The Xpert Xpress SARS-CoV-2/FLU/RSV plus assay is intended as an aid in the diagnosis of influenza from Nasopharyngeal swab specimens and should not be used as a sole basis for treatment. Nasal washings and aspirates are unacceptable for Xpert Xpress SARS-CoV-2/FLU/RSV testing.  Fact Sheet for Patients: EntrepreneurPulse.com.au  Fact Sheet for Healthcare Providers: IncredibleEmployment.be  This test is not yet approved or cleared by the [place] FDA and has been authorized for detection and/or diagnosis of SARS-CoV-2 by FDA under an Emergency Use Authorization (EUA). This EUA will remain in effect (meaning this test can be used) for the duration of the COVID-19 declaration under Section 564(b)(1) of the Act, 21 U.S.C. section 360bbb-3(b)(1), unless the authorization is terminated or revoked.    Aerobic/Anaerobic Culture w Gram Stain (surgical/deep wound)     Status: None   Collection Time: 03/09/21  9:02 PM   Specimen: Other Source; Tissue  Result Value Ref Range Status   Specimen Description WOUND LEFT WRIST SPEC A  Final   Special Requests LEFT WRIST DOG BITE  Final   Gram Stain   Final    FEW WBC PRESENT,BOTH PMN AND MONONUCLEAR ABUNDANT GRAM POSITIVE COCCI ABUNDANT GRAM NEGATIVE RODS    Culture   Final    MODERATE PASTEURELLA MULTOCIDA Usually  susceptible to penicillin and other beta lactam agents,quinolones,macrolides and tetracyclines. BACTEROIDES SPECIES BETA LACTAMASE NEGATIVE Performed at [facility], [place] [address], [place], [place] [address]    Report Status 03/13/2021 FINAL  Final  Aerobic/Anaerobic Culture w Gram Stain (surgical/deep wound)     Status: None (Preliminary result)   Collection Time: 03/11/21  5:28 PM   Specimen: Synovium; Synovial Fluid  Result Value Ref Range Status   Specimen Description SYNOVIAL FLUID  Final   Special Requests JOINT KNEE  Final   Gram Stain   Final    ABUNDANT WBC PRESENT, PREDOMINANTLY PMN NO ORGANISMS SEEN    Culture   Final    NO GROWTH 3 DAYS NO ANAEROBES ISOLATED; CULTURE IN PROGRESS FOR 5 DAYS Performed at [facility], [address], [place], [place] [address]    Report Status PENDING  Incomplete  Surgical pcr screen     Status: None   Collection Time: 03/12/21  1:43 PM   Specimen: Nasal Mucosa; Nasal Swab  Result Value Ref Range Status   MRSA, PCR NEGATIVE NEGATIVE Final   Staphylococcus aureus NEGATIVE NEGATIVE Final    Comment: (NOTE) The Xpert SA Assay (FDA approved for NASAL specimens in patients 48 years of age and older), is one component of a comprehensive surveillance program. It is not intended to diagnose infection nor to guide or monitor treatment. Performed at [facility], [place] [address], [place], [place] [address]     Time coordinating discharge: Approximately 40 minutes  Patrecia Pour, MD  [facility] 03/15/2021, 10:03 AM

## 2021-03-16 ENCOUNTER — Telehealth: Payer: Self-pay

## 2021-03-16 DIAGNOSIS — L03114 Cellulitis of left upper limb: Secondary | ICD-10-CM | POA: Diagnosis not present

## 2021-03-16 DIAGNOSIS — M25462 Effusion, left knee: Secondary | ICD-10-CM | POA: Diagnosis not present

## 2021-03-16 DIAGNOSIS — S61452A Open bite of left hand, initial encounter: Secondary | ICD-10-CM | POA: Diagnosis not present

## 2021-03-16 DIAGNOSIS — S61552A Open bite of left wrist, initial encounter: Secondary | ICD-10-CM | POA: Diagnosis not present

## 2021-03-16 DIAGNOSIS — B9689 Other specified bacterial agents as the cause of diseases classified elsewhere: Secondary | ICD-10-CM | POA: Diagnosis not present

## 2021-03-16 DIAGNOSIS — M25532 Pain in left wrist: Secondary | ICD-10-CM | POA: Diagnosis not present

## 2021-03-16 DIAGNOSIS — W540XXA Bitten by dog, initial encounter: Secondary | ICD-10-CM | POA: Diagnosis not present

## 2021-03-16 DIAGNOSIS — L02414 Cutaneous abscess of left upper limb: Secondary | ICD-10-CM | POA: Diagnosis not present

## 2021-03-16 DIAGNOSIS — S61459A Open bite of unspecified hand, initial encounter: Secondary | ICD-10-CM | POA: Diagnosis not present

## 2021-03-16 DIAGNOSIS — M00861 Arthritis due to other bacteria, right knee: Secondary | ICD-10-CM | POA: Diagnosis not present

## 2021-03-16 DIAGNOSIS — S51852A Open bite of left forearm, initial encounter: Secondary | ICD-10-CM | POA: Diagnosis not present

## 2021-03-16 DIAGNOSIS — J449 Chronic obstructive pulmonary disease, unspecified: Secondary | ICD-10-CM | POA: Diagnosis not present

## 2021-03-16 DIAGNOSIS — L02519 Cutaneous abscess of unspecified hand: Secondary | ICD-10-CM | POA: Diagnosis not present

## 2021-03-16 LAB — AEROBIC/ANAEROBIC CULTURE W GRAM STAIN (SURGICAL/DEEP WOUND): Culture: NO GROWTH

## 2021-03-16 NOTE — Telephone Encounter (Signed)
Beth with [facility] would like discharge wound care orders for right knee.  Patient had right knee surgery on 03/12/2021.  CB# [phone].  Please advise.  Thank you.

## 2021-03-16 NOTE — Telephone Encounter (Signed)
Can you please advise?

## 2021-03-16 NOTE — Telephone Encounter (Signed)
Okay to leave waterproof dressing intact and okay for showering. No bath/submersion.  We will remove dressings and sutures in office

## 2021-03-17 NOTE — Telephone Encounter (Signed)
Patient aware of the below message  

## 2021-03-18 DIAGNOSIS — J449 Chronic obstructive pulmonary disease, unspecified: Secondary | ICD-10-CM | POA: Diagnosis not present

## 2021-03-18 DIAGNOSIS — S51852A Open bite of left forearm, initial encounter: Secondary | ICD-10-CM | POA: Diagnosis not present

## 2021-03-18 DIAGNOSIS — L02414 Cutaneous abscess of left upper limb: Secondary | ICD-10-CM | POA: Diagnosis not present

## 2021-03-18 DIAGNOSIS — B9689 Other specified bacterial agents as the cause of diseases classified elsewhere: Secondary | ICD-10-CM | POA: Diagnosis not present

## 2021-03-18 DIAGNOSIS — S61452A Open bite of left hand, initial encounter: Secondary | ICD-10-CM | POA: Diagnosis not present

## 2021-03-18 DIAGNOSIS — L03114 Cellulitis of left upper limb: Secondary | ICD-10-CM | POA: Diagnosis not present

## 2021-03-18 DIAGNOSIS — M00861 Arthritis due to other bacteria, right knee: Secondary | ICD-10-CM | POA: Diagnosis not present

## 2021-03-18 DIAGNOSIS — S61552A Open bite of left wrist, initial encounter: Secondary | ICD-10-CM | POA: Diagnosis not present

## 2021-03-18 DIAGNOSIS — M25462 Effusion, left knee: Secondary | ICD-10-CM | POA: Diagnosis not present

## 2021-03-20 DIAGNOSIS — Z5181 Encounter for therapeutic drug level monitoring: Secondary | ICD-10-CM | POA: Diagnosis not present

## 2021-03-20 DIAGNOSIS — S51852A Open bite of left forearm, initial encounter: Secondary | ICD-10-CM | POA: Diagnosis not present

## 2021-03-20 DIAGNOSIS — J449 Chronic obstructive pulmonary disease, unspecified: Secondary | ICD-10-CM | POA: Diagnosis not present

## 2021-03-20 DIAGNOSIS — M79642 Pain in left hand: Secondary | ICD-10-CM | POA: Diagnosis not present

## 2021-03-20 DIAGNOSIS — B9689 Other specified bacterial agents as the cause of diseases classified elsewhere: Secondary | ICD-10-CM | POA: Diagnosis not present

## 2021-03-20 DIAGNOSIS — M00861 Arthritis due to other bacteria, right knee: Secondary | ICD-10-CM | POA: Diagnosis not present

## 2021-03-20 DIAGNOSIS — Z792 Long term (current) use of antibiotics: Secondary | ICD-10-CM | POA: Diagnosis not present

## 2021-03-20 DIAGNOSIS — L03114 Cellulitis of left upper limb: Secondary | ICD-10-CM | POA: Diagnosis not present

## 2021-03-20 DIAGNOSIS — L02414 Cutaneous abscess of left upper limb: Secondary | ICD-10-CM | POA: Diagnosis not present

## 2021-03-20 DIAGNOSIS — M25462 Effusion, left knee: Secondary | ICD-10-CM | POA: Diagnosis not present

## 2021-03-20 DIAGNOSIS — S61552A Open bite of left wrist, initial encounter: Secondary | ICD-10-CM | POA: Diagnosis not present

## 2021-03-20 DIAGNOSIS — S61452A Open bite of left hand, initial encounter: Secondary | ICD-10-CM | POA: Diagnosis not present

## 2021-03-22 ENCOUNTER — Ambulatory Visit (INDEPENDENT_AMBULATORY_CARE_PROVIDER_SITE_OTHER): Payer: Medicare HMO | Admitting: Orthopedic Surgery

## 2021-03-22 ENCOUNTER — Other Ambulatory Visit: Payer: Self-pay

## 2021-03-22 ENCOUNTER — Encounter: Payer: Self-pay | Admitting: Orthopedic Surgery

## 2021-03-22 DIAGNOSIS — M009 Pyogenic arthritis, unspecified: Secondary | ICD-10-CM

## 2021-03-22 NOTE — Progress Notes (Signed)
Post-Op Visit Note   Patient: Marie Lyons           Date of Birth: 03-Sep-1950           MRN: [id number] Visit Date: 03/22/2021 PCP: Maury Dus, Lyons   Assessment & Plan:  Chief Complaint:  Chief Complaint  Patient presents with  . Right Knee - Routine Post Op   Visit Diagnoses:  1. Infection of right knee [facility])     Plan: Marie Lyons is a 71 year old female with right knee arthroscopy washout and placement of antibiotic beads performed 03/12/2021.  She had dog bite on the left hand which likely got the right knee infected.  On exam portal sutures are intact and are discontinued.  No effusion in the right knee.  Lacks about 5 degrees of full extension but can flex to about 95.  Ambulating with walker.  Taking oxycodone as needed.  No discrete calf tenderness today and no asymmetric swelling.  She is able to stand on both toes.  She is on oral and IV antibiotics.  Plan at this time is 4-week return.  We talked about formal physical therapy but she wants to try it on her own.  Anticipate that this will be a slow recovery but overall her motion is good at this time.  Follow-up in 4 weeks for clinical recheck and release.  No other joint complaints at this time.  She will also likely be seeing infectious disease at some point in the future.  Follow-Up Instructions: Return in about 4 weeks (around 04/19/2021).   Orders:  No orders of the defined types were placed in this encounter.  No orders of the defined types were placed in this encounter.   Imaging: No results found.  PMFS History: Patient Active Problem List   Diagnosis Date Noted  . Infection of right knee ([place])   . Hand abscess 03/12/2021  . Dog bite 03/12/2021  . Infected dog bite of hand 03/08/2021  . History of CVA (cerebrovascular accident) 03/08/2021  . Dog bite of left wrist with infection   . Asthma 03/01/2021  . Depression 03/01/2021  . Major depressive disorder, recurrent episode, severe ([place]) 01/05/2018  . COPD  (chronic obstructive pulmonary disease) ([place])   . Influenza with pneumonia   . Community acquired pneumonia of right middle lobe of lung 02/12/2017  . Acute respiratory failure with hypoxia ([place]) 02/12/2017  . Tobacco user 02/12/2017  . Essential hypertension 02/12/2017  . HLD (hyperlipidemia) 02/12/2017  . Primary osteoarthritis of left hand 01/26/2016  . Decreased range of motion of finger 11/30/2015  . Joint pain in fingers of left hand 11/28/2015   Past Medical History:  Diagnosis Date  . Cardiac arrhythmia   . Chronic back pain   . High cholesterol   . Migraine   . Scoliosis   . Stroke [facility])     Family History  Problem Relation Age of Onset  . CAD Father   . Stroke Neg Hx     Past Surgical History:  Procedure Laterality Date  . ABDOMINAL HYSTERECTOMY    . BUNIONECTOMY    . I & D EXTREMITY Left 03/09/2021   Procedure: IRRIGATION AND DEBRIDEMENT LEFT  WRIST AND LEFT HAND;  Surgeon: Marie Lyons;  Location: [place] [facility];  Service: Orthopedics;  Laterality: Left;  . KNEE ARTHROSCOPY WITH MEDIAL MENISECTOMY Right 03/12/2021   Procedure: KNEE ARTHROSCOPY WITH MEDIAL MENISECTOMY; PLACEMENT OF ANTIBIOTIC BEADS;  Surgeon: Marie Lyons;  Location: [place];  Service:  Orthopedics;  Laterality: Right;  . ROTATOR CUFF REPAIR     Social History   Occupational History  . Not on file  Tobacco Use  . Smoking status: Current Every Day Smoker    Packs/day: 0.50    Years: 25.00    Pack years: 12.50    Types: Cigarettes  . Smokeless tobacco: Never Used  Substance and Sexual Activity  . Alcohol use: No  . Drug use: No  . Sexual activity: Not on file

## 2021-03-23 DIAGNOSIS — J449 Chronic obstructive pulmonary disease, unspecified: Secondary | ICD-10-CM | POA: Diagnosis not present

## 2021-03-23 DIAGNOSIS — B9689 Other specified bacterial agents as the cause of diseases classified elsewhere: Secondary | ICD-10-CM | POA: Diagnosis not present

## 2021-03-23 DIAGNOSIS — S61452A Open bite of left hand, initial encounter: Secondary | ICD-10-CM | POA: Diagnosis not present

## 2021-03-23 DIAGNOSIS — L03114 Cellulitis of left upper limb: Secondary | ICD-10-CM | POA: Diagnosis not present

## 2021-03-23 DIAGNOSIS — M25462 Effusion, left knee: Secondary | ICD-10-CM | POA: Diagnosis not present

## 2021-03-23 DIAGNOSIS — S61552A Open bite of left wrist, initial encounter: Secondary | ICD-10-CM | POA: Diagnosis not present

## 2021-03-23 DIAGNOSIS — M00861 Arthritis due to other bacteria, right knee: Secondary | ICD-10-CM | POA: Diagnosis not present

## 2021-03-23 DIAGNOSIS — S51852A Open bite of left forearm, initial encounter: Secondary | ICD-10-CM | POA: Diagnosis not present

## 2021-03-23 DIAGNOSIS — L02414 Cutaneous abscess of left upper limb: Secondary | ICD-10-CM | POA: Diagnosis not present

## 2021-03-24 DIAGNOSIS — M00861 Arthritis due to other bacteria, right knee: Secondary | ICD-10-CM | POA: Diagnosis not present

## 2021-03-24 DIAGNOSIS — S61452A Open bite of left hand, initial encounter: Secondary | ICD-10-CM | POA: Diagnosis not present

## 2021-03-24 DIAGNOSIS — J449 Chronic obstructive pulmonary disease, unspecified: Secondary | ICD-10-CM | POA: Diagnosis not present

## 2021-03-24 DIAGNOSIS — L03114 Cellulitis of left upper limb: Secondary | ICD-10-CM | POA: Diagnosis not present

## 2021-03-24 DIAGNOSIS — L02519 Cutaneous abscess of unspecified hand: Secondary | ICD-10-CM | POA: Diagnosis not present

## 2021-03-24 DIAGNOSIS — M25532 Pain in left wrist: Secondary | ICD-10-CM | POA: Diagnosis not present

## 2021-03-24 DIAGNOSIS — S51852A Open bite of left forearm, initial encounter: Secondary | ICD-10-CM | POA: Diagnosis not present

## 2021-03-24 DIAGNOSIS — S61459A Open bite of unspecified hand, initial encounter: Secondary | ICD-10-CM | POA: Diagnosis not present

## 2021-03-24 DIAGNOSIS — L02414 Cutaneous abscess of left upper limb: Secondary | ICD-10-CM | POA: Diagnosis not present

## 2021-03-24 DIAGNOSIS — S61552A Open bite of left wrist, initial encounter: Secondary | ICD-10-CM | POA: Diagnosis not present

## 2021-03-24 DIAGNOSIS — W540XXA Bitten by dog, initial encounter: Secondary | ICD-10-CM | POA: Diagnosis not present

## 2021-03-24 DIAGNOSIS — B9689 Other specified bacterial agents as the cause of diseases classified elsewhere: Secondary | ICD-10-CM | POA: Diagnosis not present

## 2021-03-24 DIAGNOSIS — M25462 Effusion, left knee: Secondary | ICD-10-CM | POA: Diagnosis not present

## 2021-03-27 ENCOUNTER — Inpatient Hospital Stay: Payer: Medicare HMO | Admitting: Internal Medicine

## 2021-03-27 DIAGNOSIS — J449 Chronic obstructive pulmonary disease, unspecified: Secondary | ICD-10-CM | POA: Diagnosis not present

## 2021-03-27 DIAGNOSIS — M659 Synovitis and tenosynovitis, unspecified: Secondary | ICD-10-CM | POA: Diagnosis not present

## 2021-03-27 DIAGNOSIS — M25462 Effusion, left knee: Secondary | ICD-10-CM | POA: Diagnosis not present

## 2021-03-27 DIAGNOSIS — S66101A Unspecified injury of flexor muscle, fascia and tendon of left index finger at wrist and hand level, initial encounter: Secondary | ICD-10-CM | POA: Diagnosis not present

## 2021-03-27 DIAGNOSIS — S61552A Open bite of left wrist, initial encounter: Secondary | ICD-10-CM | POA: Diagnosis not present

## 2021-03-27 DIAGNOSIS — B9689 Other specified bacterial agents as the cause of diseases classified elsewhere: Secondary | ICD-10-CM | POA: Diagnosis not present

## 2021-03-27 DIAGNOSIS — L02414 Cutaneous abscess of left upper limb: Secondary | ICD-10-CM | POA: Diagnosis not present

## 2021-03-27 DIAGNOSIS — L03114 Cellulitis of left upper limb: Secondary | ICD-10-CM | POA: Diagnosis not present

## 2021-03-27 DIAGNOSIS — M00861 Arthritis due to other bacteria, right knee: Secondary | ICD-10-CM | POA: Diagnosis not present

## 2021-03-27 DIAGNOSIS — S51852A Open bite of left forearm, initial encounter: Secondary | ICD-10-CM | POA: Diagnosis not present

## 2021-03-27 DIAGNOSIS — S61452A Open bite of left hand, initial encounter: Secondary | ICD-10-CM | POA: Diagnosis not present

## 2021-03-28 DIAGNOSIS — M00861 Arthritis due to other bacteria, right knee: Secondary | ICD-10-CM | POA: Diagnosis not present

## 2021-03-28 DIAGNOSIS — L02414 Cutaneous abscess of left upper limb: Secondary | ICD-10-CM | POA: Diagnosis not present

## 2021-03-28 DIAGNOSIS — J449 Chronic obstructive pulmonary disease, unspecified: Secondary | ICD-10-CM | POA: Diagnosis not present

## 2021-03-28 DIAGNOSIS — S61452A Open bite of left hand, initial encounter: Secondary | ICD-10-CM | POA: Diagnosis not present

## 2021-03-28 DIAGNOSIS — L03114 Cellulitis of left upper limb: Secondary | ICD-10-CM | POA: Diagnosis not present

## 2021-03-28 DIAGNOSIS — S61552A Open bite of left wrist, initial encounter: Secondary | ICD-10-CM | POA: Diagnosis not present

## 2021-03-28 DIAGNOSIS — M25462 Effusion, left knee: Secondary | ICD-10-CM | POA: Diagnosis not present

## 2021-03-28 DIAGNOSIS — Z792 Long term (current) use of antibiotics: Secondary | ICD-10-CM | POA: Diagnosis not present

## 2021-03-28 DIAGNOSIS — B9689 Other specified bacterial agents as the cause of diseases classified elsewhere: Secondary | ICD-10-CM | POA: Diagnosis not present

## 2021-03-28 DIAGNOSIS — S51852A Open bite of left forearm, initial encounter: Secondary | ICD-10-CM | POA: Diagnosis not present

## 2021-03-28 DIAGNOSIS — Z5181 Encounter for therapeutic drug level monitoring: Secondary | ICD-10-CM | POA: Diagnosis not present

## 2021-03-31 DIAGNOSIS — L02519 Cutaneous abscess of unspecified hand: Secondary | ICD-10-CM | POA: Diagnosis not present

## 2021-03-31 DIAGNOSIS — S61459A Open bite of unspecified hand, initial encounter: Secondary | ICD-10-CM | POA: Diagnosis not present

## 2021-03-31 DIAGNOSIS — W540XXA Bitten by dog, initial encounter: Secondary | ICD-10-CM | POA: Diagnosis not present

## 2021-04-03 DIAGNOSIS — M25462 Effusion, left knee: Secondary | ICD-10-CM | POA: Diagnosis not present

## 2021-04-03 DIAGNOSIS — J449 Chronic obstructive pulmonary disease, unspecified: Secondary | ICD-10-CM | POA: Diagnosis not present

## 2021-04-03 DIAGNOSIS — S51852A Open bite of left forearm, initial encounter: Secondary | ICD-10-CM | POA: Diagnosis not present

## 2021-04-03 DIAGNOSIS — L02414 Cutaneous abscess of left upper limb: Secondary | ICD-10-CM | POA: Diagnosis not present

## 2021-04-03 DIAGNOSIS — L03114 Cellulitis of left upper limb: Secondary | ICD-10-CM | POA: Diagnosis not present

## 2021-04-03 DIAGNOSIS — M00861 Arthritis due to other bacteria, right knee: Secondary | ICD-10-CM | POA: Diagnosis not present

## 2021-04-03 DIAGNOSIS — Z5181 Encounter for therapeutic drug level monitoring: Secondary | ICD-10-CM | POA: Diagnosis not present

## 2021-04-03 DIAGNOSIS — S61452A Open bite of left hand, initial encounter: Secondary | ICD-10-CM | POA: Diagnosis not present

## 2021-04-03 DIAGNOSIS — Z792 Long term (current) use of antibiotics: Secondary | ICD-10-CM | POA: Diagnosis not present

## 2021-04-03 DIAGNOSIS — S61552A Open bite of left wrist, initial encounter: Secondary | ICD-10-CM | POA: Diagnosis not present

## 2021-04-03 DIAGNOSIS — B9689 Other specified bacterial agents as the cause of diseases classified elsewhere: Secondary | ICD-10-CM | POA: Diagnosis not present

## 2021-04-06 DIAGNOSIS — S61452A Open bite of left hand, initial encounter: Secondary | ICD-10-CM | POA: Diagnosis not present

## 2021-04-06 DIAGNOSIS — J449 Chronic obstructive pulmonary disease, unspecified: Secondary | ICD-10-CM | POA: Diagnosis not present

## 2021-04-06 DIAGNOSIS — S51852A Open bite of left forearm, initial encounter: Secondary | ICD-10-CM | POA: Diagnosis not present

## 2021-04-06 DIAGNOSIS — M00861 Arthritis due to other bacteria, right knee: Secondary | ICD-10-CM | POA: Diagnosis not present

## 2021-04-06 DIAGNOSIS — B9689 Other specified bacterial agents as the cause of diseases classified elsewhere: Secondary | ICD-10-CM | POA: Diagnosis not present

## 2021-04-06 DIAGNOSIS — S61552A Open bite of left wrist, initial encounter: Secondary | ICD-10-CM | POA: Diagnosis not present

## 2021-04-06 DIAGNOSIS — L02414 Cutaneous abscess of left upper limb: Secondary | ICD-10-CM | POA: Diagnosis not present

## 2021-04-06 DIAGNOSIS — M25462 Effusion, left knee: Secondary | ICD-10-CM | POA: Diagnosis not present

## 2021-04-06 DIAGNOSIS — L03114 Cellulitis of left upper limb: Secondary | ICD-10-CM | POA: Diagnosis not present

## 2021-04-08 DIAGNOSIS — W540XXA Bitten by dog, initial encounter: Secondary | ICD-10-CM | POA: Diagnosis not present

## 2021-04-08 DIAGNOSIS — S61459A Open bite of unspecified hand, initial encounter: Secondary | ICD-10-CM | POA: Diagnosis not present

## 2021-04-08 DIAGNOSIS — L02519 Cutaneous abscess of unspecified hand: Secondary | ICD-10-CM | POA: Diagnosis not present

## 2021-04-09 IMAGING — CT CT HEAD W/O CM
3 series · 14 of 45 positions shown, 16 images · non-contrast
Comparison: November 10, 2020

CLINICAL DATA: Headache.

EXAM:
CT HEAD WITHOUT CONTRAST
TECHNIQUE: Contiguous axial images were obtained from the base of the skull
through the vertex without intravenous contrast.

[Series 3: head wo · axial · 0.47mm/px · z∈[-72,+43]mm · 8 of 28 slices shown, 10 images]
[im 3/28  brain]
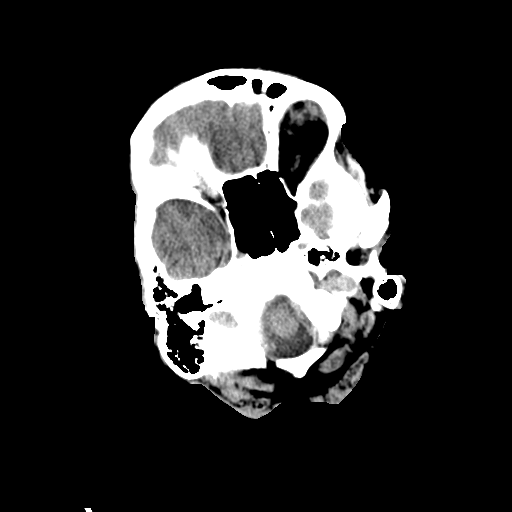
[im 3/28  bone]
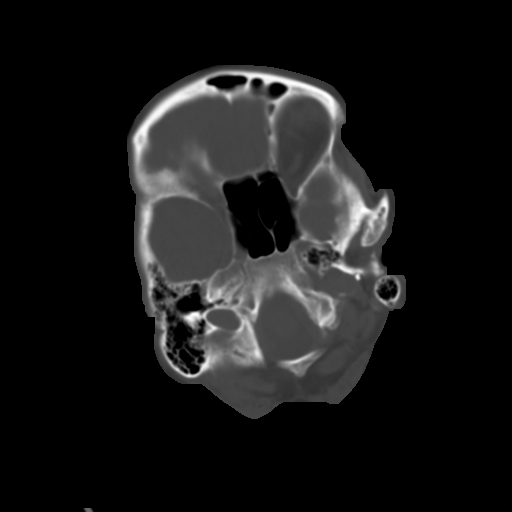
[im 6/28  brain]
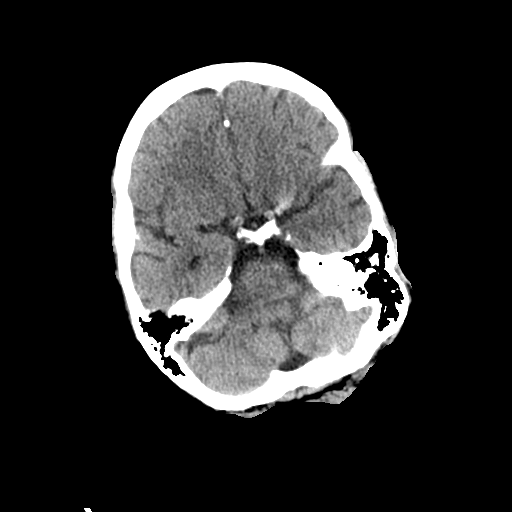
[im 10/28  brain]
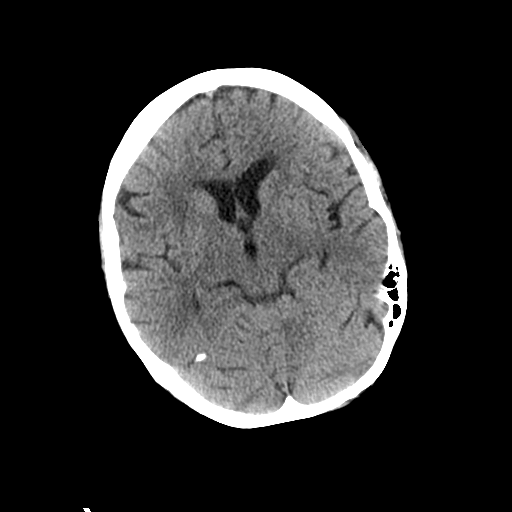
[im 13/28  brain]
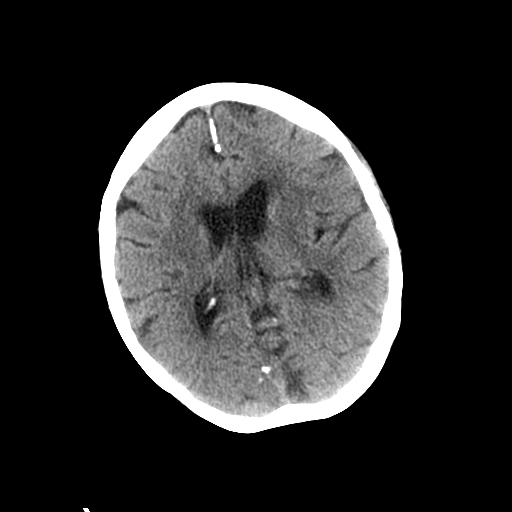
[im 16/28  brain]
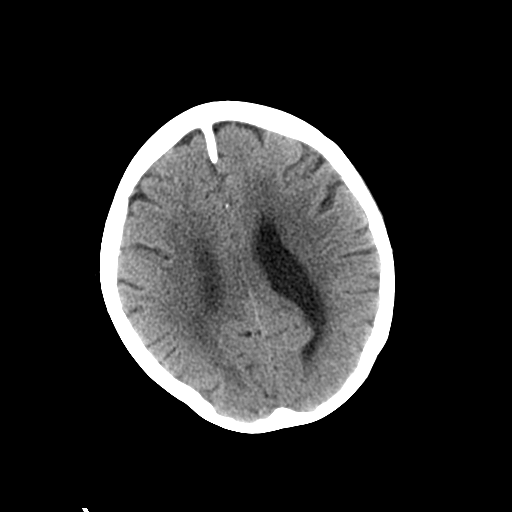
[im 16/28  bone]
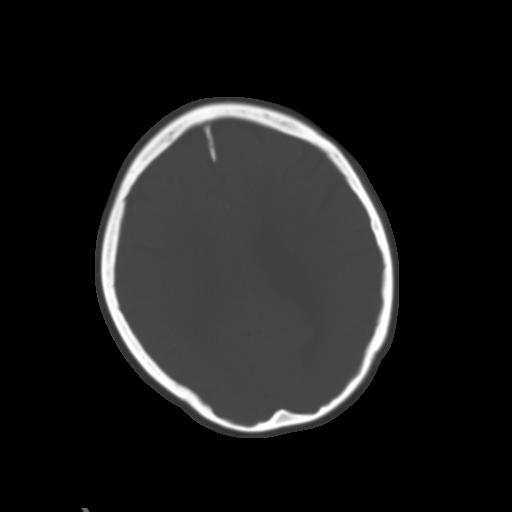
[im 19/28  brain]
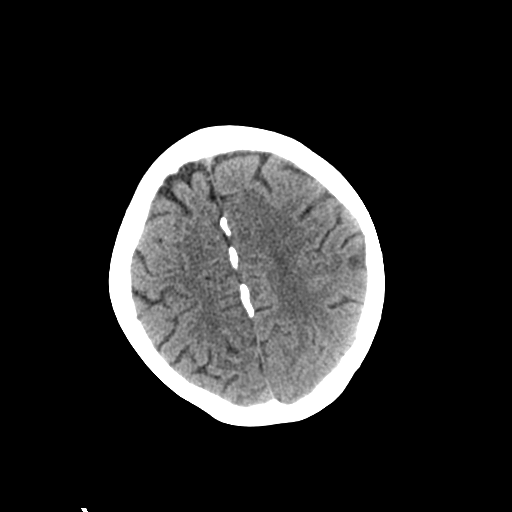
[im 23/28  brain]
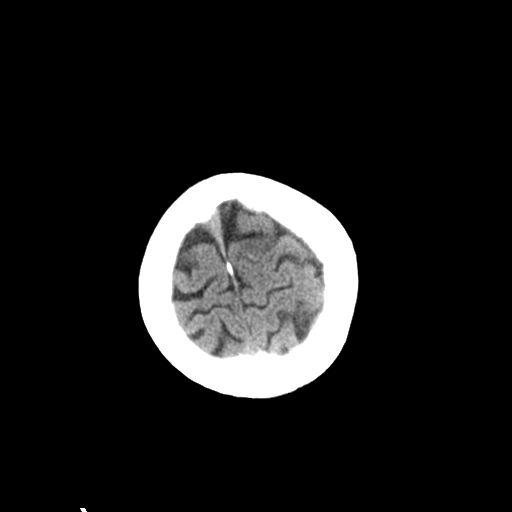
[im 26/28  brain]
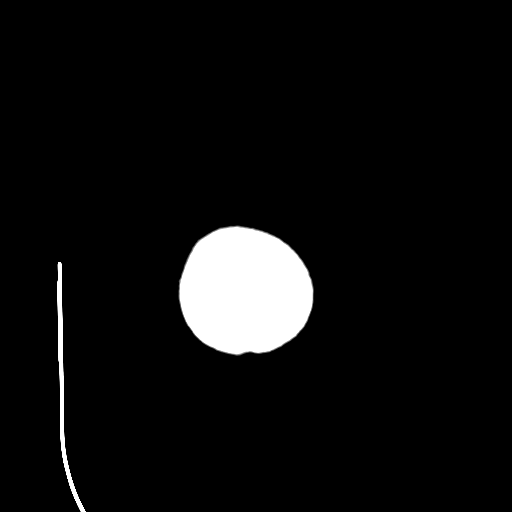

[Series 6: coronal soft tissue · coronal · 0.27mm/px · 3 of 84 slices shown]
[im 28/84  brain]
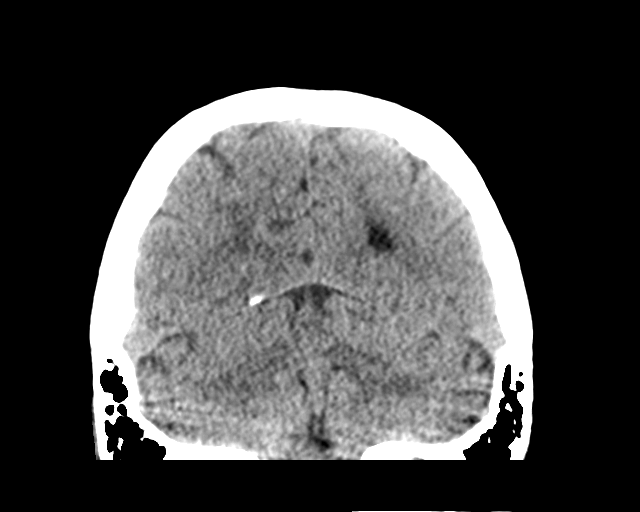
[im 37/84  brain]
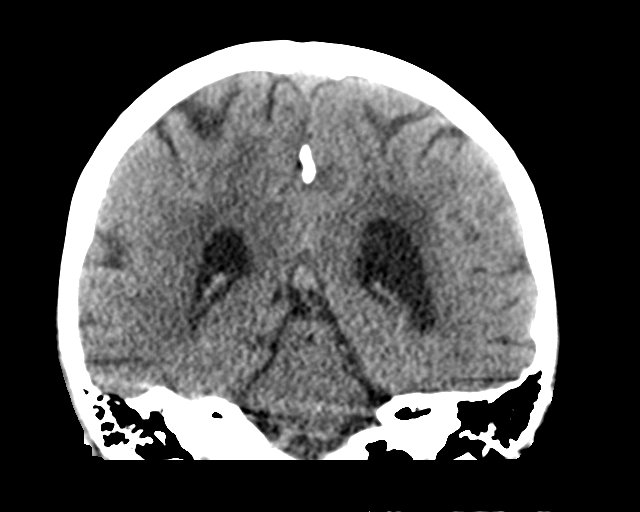
[im 47/84  brain]
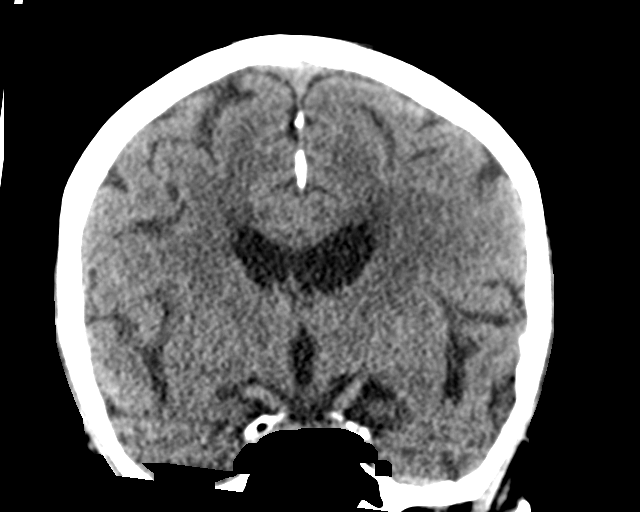

[Series 7: sagittal soft tissue · sagittal · 0.27mm/px · 3 of 59 slices shown]
[im 23/59  brain]
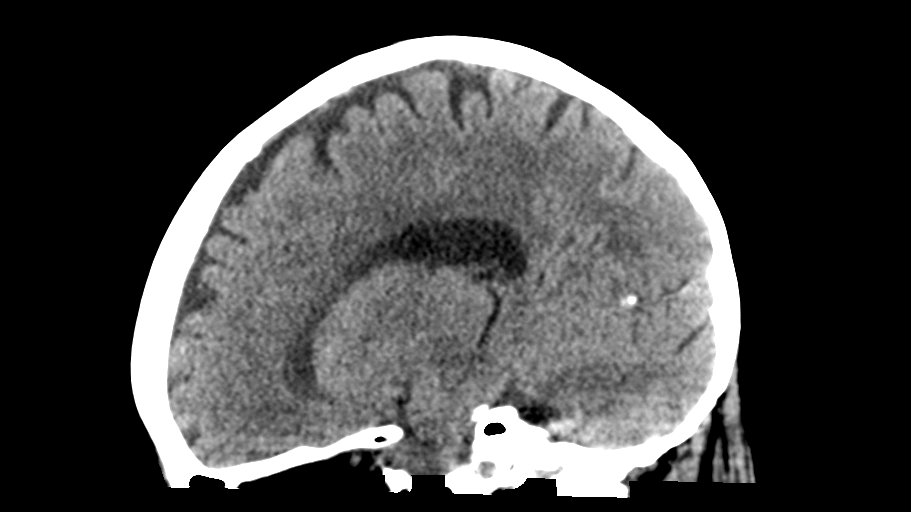
[im 30/59  brain]
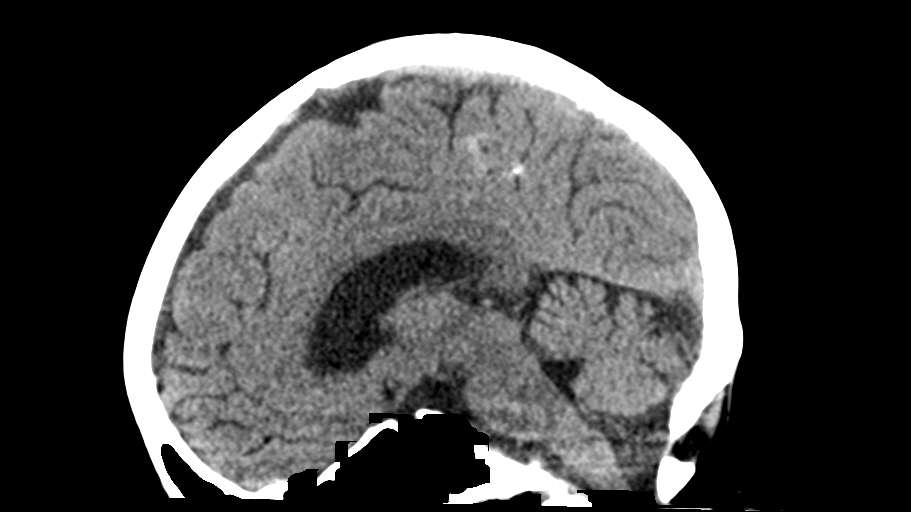
[im 36/59  brain]
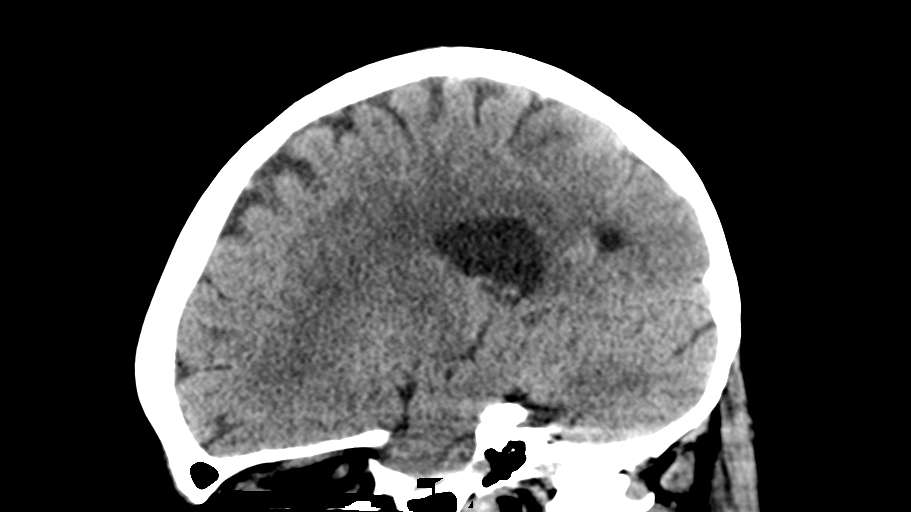

[14 of 45 positions shown; findings below may reference images not displayed]

FINDINGS: Brain: There is mild cerebral atrophy with widening of the
extra-axial spaces and ventricular dilatation.
There are areas of decreased attenuation within the white matter
tracts of the supratentorial brain, consistent with microvascular
disease changes.

Vascular: No hyperdense vessel or unexpected calcification.

Skull: Normal. Negative for fracture or focal lesion.

Sinuses/Orbits: No acute finding.

Other: None.
IMPRESSION: 1. Generalized cerebral atrophy.
2. No acute intracranial abnormality.

## 2021-04-10 DIAGNOSIS — M00861 Arthritis due to other bacteria, right knee: Secondary | ICD-10-CM | POA: Diagnosis not present

## 2021-04-10 DIAGNOSIS — L03114 Cellulitis of left upper limb: Secondary | ICD-10-CM | POA: Diagnosis not present

## 2021-04-10 DIAGNOSIS — L02414 Cutaneous abscess of left upper limb: Secondary | ICD-10-CM | POA: Diagnosis not present

## 2021-04-10 DIAGNOSIS — S61552A Open bite of left wrist, initial encounter: Secondary | ICD-10-CM | POA: Diagnosis not present

## 2021-04-10 DIAGNOSIS — B9689 Other specified bacterial agents as the cause of diseases classified elsewhere: Secondary | ICD-10-CM | POA: Diagnosis not present

## 2021-04-10 DIAGNOSIS — J449 Chronic obstructive pulmonary disease, unspecified: Secondary | ICD-10-CM | POA: Diagnosis not present

## 2021-04-10 DIAGNOSIS — S61452A Open bite of left hand, initial encounter: Secondary | ICD-10-CM | POA: Diagnosis not present

## 2021-04-10 DIAGNOSIS — S51852A Open bite of left forearm, initial encounter: Secondary | ICD-10-CM | POA: Diagnosis not present

## 2021-04-10 DIAGNOSIS — M25462 Effusion, left knee: Secondary | ICD-10-CM | POA: Diagnosis not present

## 2021-04-11 ENCOUNTER — Telehealth: Payer: Self-pay

## 2021-04-11 NOTE — Telephone Encounter (Addendum)
Marie Lyons with [facility] called stating patient is asking if IV antibiotics need to be extended for the patient. Patient did not show for her appointment on 03/27/21 with Dr. Juleen China. I have been unsuccessful getting in contact with the patient. I have spoke with the patient's son and he stated that the picc line was removed Friday, but he was unsure who removed the picc line. Marie Lyons with [facility] will be looking into seeing who removed the patient's picc line. Please advise on if patient still needs follow up with our office.  Marie Lyons

## 2021-04-12 NOTE — Telephone Encounter (Signed)
Patient returned phone call this morning. Patient stated her home health nurse removed her picc line Friday 04/07/21. Patient has rescheduled her hospital follow up to 04/13/21 with Colletta Maryland.  Jamorian Dimaria T Brooks Sailors

## 2021-04-12 NOTE — Telephone Encounter (Signed)
Thank you for the update.  Mitzi Hansen

## 2021-04-13 ENCOUNTER — Other Ambulatory Visit: Payer: Self-pay

## 2021-04-13 ENCOUNTER — Encounter: Payer: Self-pay | Admitting: Infectious Diseases

## 2021-04-13 ENCOUNTER — Ambulatory Visit: Payer: Medicare HMO | Admitting: Infectious Diseases

## 2021-04-13 VITALS — BP 119/77 | HR 68 | Temp 97.6°F | Ht 63.0 in | Wt 100.0 lb

## 2021-04-13 DIAGNOSIS — S61552D Open bite of left wrist, subsequent encounter: Secondary | ICD-10-CM

## 2021-04-13 DIAGNOSIS — J4540 Moderate persistent asthma, uncomplicated: Secondary | ICD-10-CM

## 2021-04-13 DIAGNOSIS — L089 Local infection of the skin and subcutaneous tissue, unspecified: Secondary | ICD-10-CM | POA: Diagnosis not present

## 2021-04-13 DIAGNOSIS — M009 Pyogenic arthritis, unspecified: Secondary | ICD-10-CM | POA: Diagnosis not present

## 2021-04-13 DIAGNOSIS — S61551D Open bite of right wrist, subsequent encounter: Secondary | ICD-10-CM | POA: Diagnosis not present

## 2021-04-13 DIAGNOSIS — W540XXD Bitten by dog, subsequent encounter: Secondary | ICD-10-CM

## 2021-04-13 DIAGNOSIS — Z4789 Encounter for other orthopedic aftercare: Secondary | ICD-10-CM | POA: Diagnosis not present

## 2021-04-13 DIAGNOSIS — M25562 Pain in left knee: Secondary | ICD-10-CM | POA: Diagnosis not present

## 2021-04-13 NOTE — Progress Notes (Signed)
Patient: Marie Lyons  DOB: 1950/10/11 MRN: [id number] PCP: Maury Dus, MD     Subjective:  Marie Lyons is a 71 y.o. female here for follow up on septic arthritis of her R knee and left wrist.   Past medical history COPD, CVA, chronic back pain, hypertension, depression and multiple antibiotic allergies.    She was admitted to the hospital on 03/08/21 with left wrist and forearm pain, swelling and erythema after a dog bite on 3/7.  I&D was performed on the 10th with Dr. Caralyn Guile with intraoperative cultures growing Bacteroides and Pasteurella consistent with dog bite history.  On the 12th she developed difficulty bending the right knee with pain and swelling.  X-ray and ultrasound showed a complex joint effusion: Needle aspiration was done revealing white cell count 10,950 (99% neutrophils), cloudy appearing fluid.  Cultures did not grow anything however she was on antibiotics for 5 days prior to this aspiration.  She was taken to surgery for a washout of the knee for concern over septic arthritis on the 13th March with Dr. Marlou Sa.  She was discharged on daptomycin and ertapenem once daily. Most recent set of labs on April 5 showing normal serum creatinine, normal white blood cell count and a low CK of 58. March 29 she had normal CRP and normal ESR levels.  She completed IV antibiotics on 8 April and her PICC line has been removed.  FU with Dr. Marlou Sa on 3/23 -she seem to be healing well with approximation of her incision.  Sutures were removed.  No effusion in the right knee near full extension and flexion to about 90 degrees.  She is ambulating with a walker at that time taking as needed narcotics for the pain.  He is planning on following up with her in 4 weeks.  (around mid April).   Since that visit she no longer needs a walker to ambulate and is off all pain medication.  She does report some new pain and throbbing to the left knee that is new over the last several weeks.  This started  while she was on antibiotics.  She does not feel like it swollen but the pain itself is similar to the right knee.  Right knee continues to have some tenderness right above the kneecap.  Does not feel like this is getting worse is just slow in her opinion to get better.  Left wrist she said is having some more burning sensation.  She goes to see Dr. Apolonio Schneiders today.  She saw the surgical PA last week and was given Keflex for some erythema over the sutures.  She did not take this due to anaphylactic allergy history.  She has not changed her dressing as instructed. Any fevers or chills.  No drainage from any of the incisions.  The 2 things that are really bothering her are increased shortness of breath and poor appetite.  She feels like she gets winded very easily with small test.  She says she has had a diagnosis of COPD in the past on her chart but is not aware of much details.  She has a Ventolin inhaler she uses at least every day once.  Her home health nurse reported wheezing one point.  She does not have a productive cough.  She is bothered by environmental allergies and pollen and is taking Flonase she is not able to take due to the fact that the trigger migraines for her.  She does not have any other inhalers.  Poor appetite and weight loss has been since October 2021 that predates this current infection problem.  She says she is lost about 16 pounds since then.  Denies any concern for anxious or depressed mood.  She is sleeping well.   Review of Systems  Constitutional: Positive for malaise/fatigue and weight loss. Negative for chills, diaphoresis and fever.  HENT: Positive for congestion.   Respiratory: Positive for shortness of breath and wheezing. Negative for cough and sputum production.   Cardiovascular: Negative for chest pain and leg swelling.  Gastrointestinal: Negative for abdominal pain, constipation and diarrhea.  Genitourinary: Negative.   Musculoskeletal: Positive for joint pain.  Negative for falls.  Skin: Negative for rash.  Neurological: Negative for dizziness and focal weakness.  Endo/Heme/Allergies: Positive for environmental allergies.  Psychiatric/Behavioral: Negative for depression.     Past Medical History:  Diagnosis Date  . Cardiac arrhythmia   . Chronic back pain   . High cholesterol   . Migraine   . Scoliosis   . Stroke [facility])     Outpatient Medications Prior to Visit  Medication Sig Dispense Refill  . acetaminophen (TYLENOL) 500 MG tablet Take 500 mg by mouth every 6 (six) hours as needed for headache (or pain).     . ARTIFICIAL TEAR OP Place 1 drop into both eyes daily.    . calcium elemental as carbonate (BARIATRIC TUMS ULTRA) 400 MG chewable tablet Chew 1,000 mg by mouth daily as needed for heartburn.    . cholecalciferol (VITAMIN D) 1000 UNITS tablet Take 1,000 Units by mouth daily.     . cyclobenzaprine (FLEXERIL) 10 MG tablet Take 10 mg by mouth at bedtime.  2  . diphenhydrAMINE (BENADRYL) 25 MG tablet Take 1 tablet (25 mg total) by mouth every 6 (six) hours as needed. (Patient taking differently: Take 25 mg by mouth every 6 (six) hours as needed for allergies.) 30 tablet 0  . escitalopram (LEXAPRO) 20 MG tablet Take 20 mg by mouth daily.  1  . estradiol (VIVELLE-DOT) 0.1 MG/24HR patch Place 1 patch onto the skin once a week.    . fluticasone (FLONASE) 50 MCG/ACT nasal spray Place 2 sprays into the nose daily. 16 g 12  . folic acid (FOLVITE) 1 MG tablet Take 1 tablet (1 mg total) by mouth daily. 90 tablet 1  . HYDROcodone-acetaminophen (NORCO/VICODIN) 5-325 MG tablet 1 tablet    . Melatonin 10 MG TABS Take 1 tablet by mouth at bedtime.    . metoprolol succinate (TOPROL-XL) 25 MG 24 hr tablet Take 25 mg by mouth at bedtime.    . Multiple Vitamin (MULTIVITAMIN WITH MINERALS) TABS tablet Take 1 tablet by mouth daily. 90 tablet 1  . saccharomyces boulardii (FLORASTOR) 250 MG capsule Take 1 capsule (250 mg total) by mouth 2 (two) times daily. 84  capsule 0  . simvastatin (ZOCOR) 20 MG tablet 1 tablet    . VENTOLIN HFA 108 (90 Base) MCG/ACT inhaler Inhale 2 puffs into the lungs every 4 (four) hours as needed for wheezing or shortness of breath.  5  . vitamin C (ASCORBIC ACID) 250 MG tablet Take 250 mg by mouth daily.    Marland Kitchen EPINEPHrine 0.3 mg/0.3 mL IJ SOAJ injection Inject 0.3 mg into the muscle as needed for anaphylaxis. (Patient not taking: Reported on 04/13/2021) 1 each 0   No facility-administered medications prior to visit.     Allergies  Allergen Reactions  . Ciprofloxacin Shortness Of Breath    Denies this allergy on 09/23/16 (??)  .  Keflex [Cephalexin] Anaphylaxis  . Penicillins Anaphylaxis    Has patient had a PCN reaction causing immediate rash, facial/tongue/throat swelling, SOB or lightheadedness with hypotension: Yes Has patient had a PCN reaction causing severe rash involving mucus membranes or skin necrosis: Yes Has patient had a PCN reaction that required hospitalization Yes Has patient had a PCN reaction occurring within the last 10 years: No If all of the above answers are "NO", then may proceed with Cephalosporin use.   . Aspirin Other (See Comments)    Aggravates ulcer  . Darvon [Propoxyphene Hcl] Other (See Comments)    Wheezing  . Ibuprofen Other (See Comments)    Wheezing  . Imitrex [Sumatriptan] Other (See Comments)    Contraindicated because pt had a stroke  . Meperidine Other (See Comments)    Unknown reaction (per patient)  . Monosodium Glutamate Other (See Comments)    Migraine   . Nitrates, Organic Other (See Comments)    Migraine   . Nsaids Other (See Comments)    Aggravates ulcer  . Sulfa Antibiotics Other (See Comments)    Unknown reaction (per patient)    Social History   Tobacco Use  . Smoking status: Former Smoker    Packs/day: 0.50    Years: 25.00    Pack years: 12.50    Types: Cigarettes  . Smokeless tobacco: Never Used  Substance Use Topics  . Alcohol use: No  . Drug use:  No    Family History  Problem Relation Age of Onset  . CAD Father   . Stroke Neg Hx     Objective:   Vitals:   04/13/21 1058  BP: 119/77  Pulse: 68  Temp: 97.6 F (36.4 C)  TempSrc: Oral  SpO2: 97%  Weight: 100 lb (45.4 kg)  Height: _0  (1.6 m)   Body mass index is 17.71 kg/m.  Physical Exam Vitals reviewed.  Constitutional:      Appearance: Normal appearance. She is well-developed.     Comments: Seated comfortably in chair.   HENT:     Mouth/Throat:     Mouth: No oral lesions.     Dentition: Normal dentition. No dental abscesses.     Pharynx: No oropharyngeal exudate.  Cardiovascular:     Rate and Rhythm: Normal rate and regular rhythm.     Heart sounds: Normal heart sounds.  Pulmonary:     Effort: Pulmonary effort is normal.     Breath sounds: Wheezing present. No rhonchi.  Abdominal:     General: There is no distension.     Palpations: Abdomen is soft.     Tenderness: There is no abdominal tenderness.  Musculoskeletal:     Right knee: Normal range of motion. Tenderness (suprapatellar tendernss and slight swelling) present.     Left knee: No swelling or effusion. Normal range of motion. Tenderness present.     Comments: Her left wrist is wrapped in a clean dry dressing with an armboard to immobilize.  She is going to see Dr. Apolonio Schneiders today.  Visible skin without signs of any cellulitis.   Skin:    General: Skin is warm and dry.     Findings: No rash.  Neurological:     Mental Status: She is alert and oriented to person, place, and time.  Psychiatric:        Judgment: Judgment normal.     Comments: In good spirits today and engaged in care discussion     Lab Results: Lab Results  Component Value  Date   WBC 8.4 03/15/2021   HGB 11.0 (L) 03/15/2021   HCT 32.9 (L) 03/15/2021   MCV 94.0 03/15/2021   PLT 314 03/15/2021    Lab Results  Component Value Date   CREATININE 0.68 03/15/2021   BUN 15 03/15/2021   NA 136 03/15/2021   K 3.6 03/15/2021   CL  96 (L) 03/15/2021   CO2 30 03/15/2021    Lab Results  Component Value Date   ALT 11 03/08/2021   AST 15 03/08/2021   ALKPHOS 52 03/08/2021   BILITOT 0.3 03/08/2021     Assessment & Plan:   Problem List Items Addressed This Visit      Unprioritized   Left knee pain    Her left knee does not appear to be septic based on my assessment.  There is no effusion and range of motion is comparable to the right knee.  She has follow-up with Dr. Marlou Sa soon.  Pasturealla does not cause typically a reactive arthritis that I am aware of like other animal pathogens can; I suspect this is due to the fact she has had to compensate for her right knee surgery recently.   Follow-up with Dr. Marlou Sa as scheduled, will call for more urgent visit if her inflammatory markers were elevated and concerning for relapsing infection.       Infection of right knee ([place]) - Primary    Status post I&D with positive Intra-Op cultures no growth (on antibiotics prior to aspiration) s/p treatment with IV daptomycin + ertapenem x4 weeks via right upper extremity PICC line.  Her right knee still has some suprapatellar tenderness and some very slight swelling.  Normal temperature and at least comfortable range of motion compared to her visit with Dr. Marlou Sa a few weeks ago. Her inflammatory markers normalized on IV antibiotic therapy.   Overall she appears improved but sounds like the progress in her opinion has been slow.  Repeat CRP ESR today follow-up with Dr. Marlou Sa as scheduled if there is concern for residual infection we will have her see him and consider putting her on doxycycline in the interim.  She is not having any systemic symptoms.   Follow-up to be determined after lab results are in.      Relevant Orders   C-reactive protein   Sedimentation rate   CBC   Dog bite of left wrist with infection    Status post I&D with positive Intra-Op cultures for Bacteroides and Pasteurella s/p treatment with IV daptomycin +  ertapenem x4 weeks via right upper extremity PICC line.  It sounds like there was some concern from the surgical team at their last visit she may have some superficial cellulitis at the incision line.  Sutures were left in.  She was given a prescription for Keflex which she did not take due to anaphylactic allergy history.  She not had any drainage or increased pain or throbbing good tightness of her dressing some hopeful her sutures are just ready to come out later today and that explains and results of burning she has.  I left the dressing in place and she has an appointment with Dr. Apolonio Schneiders this afternoon. CRP/ESR drawn and in progress.   She has tolerated doxycycline in the past if there is concern for residual infection this would cover staphylococcus and pasturella.      Asthma    On exam she is quite wheezy in all lung fields.  She has her albuterol inhaler with her.  I asked  her to call and schedule an appointment with her general practitioner to discuss montelukast daily and consideration of a step up in inhaler therapy with a steroid/LABA combo.  I explained to her today that I think the allergies are triggering her asthma/COPD and it is unrelated to her previous infection.  This certainly would explain her shortness of breath lately.         Janene Madeira, MSN, NP-C [facility] Pager: [phone] Office: [phone]  04/13/21  1:25 PM

## 2021-04-13 NOTE — Assessment & Plan Note (Signed)
Status post I&D with positive Intra-Op cultures no growth (on antibiotics prior to aspiration) s/p treatment with IV daptomycin + ertapenem x4 weeks via right upper extremity PICC line.  Her right knee still has some suprapatellar tenderness and some very slight swelling.  Normal temperature and at least comfortable range of motion compared to her visit with Dr. Marlou Sa a few weeks ago. Her inflammatory markers normalized on IV antibiotic therapy.   Overall she appears improved but sounds like the progress in her opinion has been slow.  Repeat CRP ESR today follow-up with Dr. Marlou Sa as scheduled if there is concern for residual infection we will have her see him and consider putting her on doxycycline in the interim.  She is not having any systemic symptoms.   Follow-up to be determined after lab results are in.

## 2021-04-13 NOTE — Assessment & Plan Note (Addendum)
Her left knee does not appear to be septic based on my assessment.  There is no effusion and range of motion is comparable to the right knee.  She has follow-up with Dr. Marlou Sa soon.  Pasturealla does not cause typically a reactive arthritis that I am aware of like other animal pathogens can; I suspect this is due to the fact she has had to compensate for her right knee surgery recently.   Follow-up with Dr. Marlou Sa as scheduled, will call for more urgent visit if her inflammatory markers were elevated and concerning for relapsing infection.

## 2021-04-13 NOTE — Assessment & Plan Note (Signed)
Status post I&D with positive Intra-Op cultures for Bacteroides and Pasteurella s/p treatment with IV daptomycin + ertapenem x4 weeks via right upper extremity PICC line.  It sounds like there was some concern from the surgical team at their last visit she may have some superficial cellulitis at the incision line.  Sutures were left in.  She was given a prescription for Keflex which she did not take due to anaphylactic allergy history.  She not had any drainage or increased pain or throbbing good tightness of her dressing some hopeful her sutures are just ready to come out later today and that explains and results of burning she has.  I left the dressing in place and she has an appointment with Dr. Apolonio Schneiders this afternoon. CRP/ESR drawn and in progress.   She has tolerated doxycycline in the past if there is concern for residual infection this would cover staphylococcus and pasturella.

## 2021-04-13 NOTE — Assessment & Plan Note (Signed)
On exam she is quite wheezy in all lung fields.  She has her albuterol inhaler with her.  I asked her to call and schedule an appointment with her general practitioner to discuss montelukast daily and consideration of a step up in inhaler therapy with a steroid/LABA combo.  I explained to her today that I think the allergies are triggering her asthma/COPD and it is unrelated to her previous infection.  This certainly would explain her shortness of breath lately.

## 2021-04-13 NOTE — Patient Instructions (Signed)
For your allergies - I would talk with Dr. Alyson Ingles about adding Montelukast (Singulair) to your regimen. This is not an antihistamine and works in a different way to help with reducing your allergies.   If you are using your Ventolin inhaler everyday and still having shortness of breath, you may need to be on a daily maintenance inhaler. These frequently contain a light steroid dose and a maintenance medication that will open up the airways for you.  Please also discuss with Dr. Alyson Ingles about this too - you are quite wheezy today so not a wonder why you feel short of breath.   Please stop by the lab on your way out - I will let you know what the results show and if there is a concern for infection. If these are elevated I would like for you to see if you can see Dr. Marlou Sa sooner to look at your left knee to consider a joint aspiration here as well.   Tylenol as needed for the pain. Could try Volteren as needed topically over intact skin (so no open incisions) - this is similar to ibuprofen like products but topical is easier for many to tolerate.

## 2021-04-14 LAB — CBC
HCT: 42.1 % (ref 35.0–45.0)
Hemoglobin: 13.7 g/dL (ref 11.7–15.5)
MCH: 31.7 pg (ref 27.0–33.0)
MCHC: 32.5 g/dL (ref 32.0–36.0)
MCV: 97.5 fL (ref 80.0–100.0)
MPV: 10.6 fL (ref 7.5–12.5)
Platelets: 214 10*3/uL (ref 140–400)
RBC: 4.32 10*6/uL (ref 3.80–5.10)
RDW: 13.4 % (ref 11.0–15.0)
WBC: 8.1 10*3/uL (ref 3.8–10.8)

## 2021-04-14 LAB — C-REACTIVE PROTEIN: CRP: 5.9 mg/L (ref <8.0)

## 2021-04-14 LAB — SEDIMENTATION RATE: Sed Rate: 9 mm/h (ref 0–30)

## 2021-04-17 DIAGNOSIS — M00861 Arthritis due to other bacteria, right knee: Secondary | ICD-10-CM | POA: Diagnosis not present

## 2021-04-17 DIAGNOSIS — S61452A Open bite of left hand, initial encounter: Secondary | ICD-10-CM | POA: Diagnosis not present

## 2021-04-17 DIAGNOSIS — S61552A Open bite of left wrist, initial encounter: Secondary | ICD-10-CM | POA: Diagnosis not present

## 2021-04-17 DIAGNOSIS — M25462 Effusion, left knee: Secondary | ICD-10-CM | POA: Diagnosis not present

## 2021-04-17 DIAGNOSIS — L02414 Cutaneous abscess of left upper limb: Secondary | ICD-10-CM | POA: Diagnosis not present

## 2021-04-17 DIAGNOSIS — B9689 Other specified bacterial agents as the cause of diseases classified elsewhere: Secondary | ICD-10-CM | POA: Diagnosis not present

## 2021-04-17 DIAGNOSIS — L03114 Cellulitis of left upper limb: Secondary | ICD-10-CM | POA: Diagnosis not present

## 2021-04-17 DIAGNOSIS — J449 Chronic obstructive pulmonary disease, unspecified: Secondary | ICD-10-CM | POA: Diagnosis not present

## 2021-04-17 DIAGNOSIS — S51852A Open bite of left forearm, initial encounter: Secondary | ICD-10-CM | POA: Diagnosis not present

## 2021-04-19 ENCOUNTER — Ambulatory Visit (INDEPENDENT_AMBULATORY_CARE_PROVIDER_SITE_OTHER): Payer: Medicare HMO | Admitting: Orthopedic Surgery

## 2021-04-19 DIAGNOSIS — M009 Pyogenic arthritis, unspecified: Secondary | ICD-10-CM

## 2021-04-23 ENCOUNTER — Encounter: Payer: Self-pay | Admitting: Orthopedic Surgery

## 2021-04-23 NOTE — Progress Notes (Signed)
Post-Op Visit Note   Patient: Marie Lyons           Date of Birth: 1950/06/12           MRN: [id number] Visit Date: 04/19/2021 PCP: Maury Dus, MD   Assessment & Plan:  Chief Complaint:  Chief Complaint  Patient presents with  . Right Knee - Routine Post Op   Visit Diagnoses:  1. Infection of right knee [facility])     Plan: Patient presents now about a month out from knee arthroscopy on the right with partial medial meniscectomy and placement of antibiotic beads.  She had a dog bite on her hand.  She is doing some better.  On doxycycline for her hand.  Hard for her to walk after standing up.  On examination there is minimal to no effusion in the right knee.  Slight pain and diminished motion with internal rotation of the right hip.  No nerve root tension signs.  Plan at this time is 4-week return for final clinical check on the knee.  She will need radiographs of the right hip at that time to evaluate for arthritis.  Follow-Up Instructions: Return in about 4 weeks (around 05/17/2021).   Orders:  No orders of the defined types were placed in this encounter.  No orders of the defined types were placed in this encounter.   Imaging: No results found.  PMFS History: Patient Active Problem List   Diagnosis Date Noted  . Left knee pain 04/13/2021  . Infection of right knee ([place])   . Hand abscess 03/12/2021  . Dog bite 03/12/2021  . History of CVA (cerebrovascular accident) 03/08/2021  . Dog bite of left wrist with infection   . Asthma 03/01/2021  . Depression 03/01/2021  . Major depressive disorder, recurrent episode, severe ([place]) 01/05/2018  . COPD (chronic obstructive pulmonary disease) ([place])   . Influenza with pneumonia   . Tobacco user 02/12/2017  . Essential hypertension 02/12/2017  . HLD (hyperlipidemia) 02/12/2017  . Primary osteoarthritis of left hand 01/26/2016  . Decreased range of motion of finger 11/30/2015  . Joint pain in fingers of left hand 11/28/2015    Past Medical History:  Diagnosis Date  . Cardiac arrhythmia   . Chronic back pain   . High cholesterol   . Migraine   . Scoliosis   . Stroke [facility])     Family History  Problem Relation Age of Onset  . CAD Father   . Stroke Neg Hx     Past Surgical History:  Procedure Laterality Date  . ABDOMINAL HYSTERECTOMY    . BUNIONECTOMY    . I & D EXTREMITY Left 03/09/2021   Procedure: IRRIGATION AND DEBRIDEMENT LEFT  WRIST AND LEFT HAND;  Surgeon: Iran Planas, MD;  Location: [place];  Service: Orthopedics;  Laterality: Left;  . KNEE ARTHROSCOPY WITH MEDIAL MENISECTOMY Right 03/12/2021   Procedure: KNEE ARTHROSCOPY WITH MEDIAL MENISECTOMY; PLACEMENT OF ANTIBIOTIC BEADS;  Surgeon: Meredith Pel, MD;  Location: [place];  Service: Orthopedics;  Laterality: Right;  . ROTATOR CUFF REPAIR     Social History   Occupational History  . Not on file  Tobacco Use  . Smoking status: Former Smoker    Packs/day: 0.50    Years: 25.00    Pack years: 12.50    Types: Cigarettes  . Smokeless tobacco: Never Used  Substance and Sexual Activity  . Alcohol use: No  . Drug use: No  . Sexual activity: Not on file

## 2021-04-24 DIAGNOSIS — S61452A Open bite of left hand, initial encounter: Secondary | ICD-10-CM | POA: Diagnosis not present

## 2021-04-24 DIAGNOSIS — M25462 Effusion, left knee: Secondary | ICD-10-CM | POA: Diagnosis not present

## 2021-04-24 DIAGNOSIS — B9689 Other specified bacterial agents as the cause of diseases classified elsewhere: Secondary | ICD-10-CM | POA: Diagnosis not present

## 2021-04-24 DIAGNOSIS — S51852A Open bite of left forearm, initial encounter: Secondary | ICD-10-CM | POA: Diagnosis not present

## 2021-04-24 DIAGNOSIS — L03114 Cellulitis of left upper limb: Secondary | ICD-10-CM | POA: Diagnosis not present

## 2021-04-24 DIAGNOSIS — M00861 Arthritis due to other bacteria, right knee: Secondary | ICD-10-CM | POA: Diagnosis not present

## 2021-04-24 DIAGNOSIS — J449 Chronic obstructive pulmonary disease, unspecified: Secondary | ICD-10-CM | POA: Diagnosis not present

## 2021-04-24 DIAGNOSIS — L02414 Cutaneous abscess of left upper limb: Secondary | ICD-10-CM | POA: Diagnosis not present

## 2021-04-24 DIAGNOSIS — S61552A Open bite of left wrist, initial encounter: Secondary | ICD-10-CM | POA: Diagnosis not present

## 2021-04-26 ENCOUNTER — Inpatient Hospital Stay: Payer: Medicare HMO | Admitting: Internal Medicine

## 2021-05-02 DIAGNOSIS — L03114 Cellulitis of left upper limb: Secondary | ICD-10-CM | POA: Diagnosis not present

## 2021-05-02 DIAGNOSIS — S61452A Open bite of left hand, initial encounter: Secondary | ICD-10-CM | POA: Diagnosis not present

## 2021-05-02 DIAGNOSIS — B9689 Other specified bacterial agents as the cause of diseases classified elsewhere: Secondary | ICD-10-CM | POA: Diagnosis not present

## 2021-05-02 DIAGNOSIS — M25462 Effusion, left knee: Secondary | ICD-10-CM | POA: Diagnosis not present

## 2021-05-02 DIAGNOSIS — M00861 Arthritis due to other bacteria, right knee: Secondary | ICD-10-CM | POA: Diagnosis not present

## 2021-05-02 DIAGNOSIS — S61552A Open bite of left wrist, initial encounter: Secondary | ICD-10-CM | POA: Diagnosis not present

## 2021-05-02 DIAGNOSIS — S51852A Open bite of left forearm, initial encounter: Secondary | ICD-10-CM | POA: Diagnosis not present

## 2021-05-02 DIAGNOSIS — L02414 Cutaneous abscess of left upper limb: Secondary | ICD-10-CM | POA: Diagnosis not present

## 2021-05-02 DIAGNOSIS — J449 Chronic obstructive pulmonary disease, unspecified: Secondary | ICD-10-CM | POA: Diagnosis not present

## 2021-05-19 ENCOUNTER — Other Ambulatory Visit: Payer: Self-pay

## 2021-05-19 ENCOUNTER — Ambulatory Visit: Payer: Medicare HMO | Admitting: Orthopedic Surgery

## 2021-05-30 ENCOUNTER — Emergency Department (HOSPITAL_BASED_OUTPATIENT_CLINIC_OR_DEPARTMENT_OTHER): Payer: Medicare HMO | Admitting: Radiology

## 2021-05-30 ENCOUNTER — Other Ambulatory Visit: Payer: Self-pay

## 2021-05-30 ENCOUNTER — Emergency Department (HOSPITAL_BASED_OUTPATIENT_CLINIC_OR_DEPARTMENT_OTHER)
Admission: EM | Admit: 2021-05-30 | Discharge: 2021-05-30 | Disposition: A | Discharge status: Home / Self Care | Payer: Medicare HMO | Attending: Emergency Medicine | Admitting: Emergency Medicine

## 2021-05-30 ENCOUNTER — Encounter (HOSPITAL_BASED_OUTPATIENT_CLINIC_OR_DEPARTMENT_OTHER): Payer: Self-pay | Admitting: Emergency Medicine

## 2021-05-30 ENCOUNTER — Emergency Department (HOSPITAL_BASED_OUTPATIENT_CLINIC_OR_DEPARTMENT_OTHER): Payer: Medicare HMO

## 2021-05-30 DIAGNOSIS — J45909 Unspecified asthma, uncomplicated: Secondary | ICD-10-CM | POA: Diagnosis not present

## 2021-05-30 DIAGNOSIS — R079 Chest pain, unspecified: Secondary | ICD-10-CM | POA: Diagnosis not present

## 2021-05-30 DIAGNOSIS — Y92410 Unspecified street and highway as the place of occurrence of the external cause: Secondary | ICD-10-CM | POA: Diagnosis not present

## 2021-05-30 DIAGNOSIS — M4802 Spinal stenosis, cervical region: Secondary | ICD-10-CM | POA: Diagnosis not present

## 2021-05-30 DIAGNOSIS — Z79899 Other long term (current) drug therapy: Secondary | ICD-10-CM | POA: Diagnosis not present

## 2021-05-30 DIAGNOSIS — S20212A Contusion of left front wall of thorax, initial encounter: Secondary | ICD-10-CM | POA: Diagnosis not present

## 2021-05-30 DIAGNOSIS — Z7951 Long term (current) use of inhaled steroids: Secondary | ICD-10-CM | POA: Diagnosis not present

## 2021-05-30 DIAGNOSIS — F17210 Nicotine dependence, cigarettes, uncomplicated: Secondary | ICD-10-CM | POA: Diagnosis not present

## 2021-05-30 DIAGNOSIS — J449 Chronic obstructive pulmonary disease, unspecified: Secondary | ICD-10-CM | POA: Insufficient documentation

## 2021-05-30 DIAGNOSIS — I959 Hypotension, unspecified: Secondary | ICD-10-CM | POA: Diagnosis not present

## 2021-05-30 DIAGNOSIS — R0902 Hypoxemia: Secondary | ICD-10-CM | POA: Diagnosis not present

## 2021-05-30 DIAGNOSIS — Z041 Encounter for examination and observation following transport accident: Secondary | ICD-10-CM | POA: Diagnosis not present

## 2021-05-30 DIAGNOSIS — S299XXA Unspecified injury of thorax, initial encounter: Secondary | ICD-10-CM | POA: Diagnosis present

## 2021-05-30 DIAGNOSIS — M47812 Spondylosis without myelopathy or radiculopathy, cervical region: Secondary | ICD-10-CM | POA: Diagnosis not present

## 2021-05-30 DIAGNOSIS — I10 Essential (primary) hypertension: Secondary | ICD-10-CM | POA: Diagnosis not present

## 2021-05-30 DIAGNOSIS — S0990XA Unspecified injury of head, initial encounter: Secondary | ICD-10-CM | POA: Diagnosis not present

## 2021-05-30 DIAGNOSIS — R0789 Other chest pain: Secondary | ICD-10-CM | POA: Diagnosis not present

## 2021-05-30 HISTORY — DX: Essential (primary) hypertension: I10

## 2021-05-30 MED ORDER — HYDROCODONE-ACETAMINOPHEN 5-325 MG PO TABS
1.0000 | ORAL_TABLET | Freq: Once | ORAL | Status: AC
  Administered 2021-05-30: 1 via ORAL
  Filled 2021-05-30: qty 1
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
## 2021-05-30 NOTE — ED Notes (Signed)
Patient verbalizes understanding of discharge instructions. Opportunity for questioning and answers were provided. Armband removed by staff, pt discharged from ED.  

## 2021-05-31 ENCOUNTER — Ambulatory Visit: Payer: Self-pay

## 2021-05-31 ENCOUNTER — Ambulatory Visit (INDEPENDENT_AMBULATORY_CARE_PROVIDER_SITE_OTHER): Payer: Medicare HMO | Admitting: Orthopedic Surgery

## 2021-05-31 ENCOUNTER — Ambulatory Visit (INDEPENDENT_AMBULATORY_CARE_PROVIDER_SITE_OTHER): Payer: Medicare HMO

## 2021-05-31 DIAGNOSIS — M25561 Pain in right knee: Secondary | ICD-10-CM | POA: Diagnosis not present

## 2021-05-31 DIAGNOSIS — M25551 Pain in right hip: Secondary | ICD-10-CM | POA: Diagnosis not present

## 2021-06-02 ENCOUNTER — Encounter: Payer: Self-pay | Admitting: Orthopedic Surgery
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
## 2021-06-08 DIAGNOSIS — E46 Unspecified protein-calorie malnutrition: Secondary | ICD-10-CM | POA: Diagnosis not present

## 2021-06-08 DIAGNOSIS — F324 Major depressive disorder, single episode, in partial remission: Secondary | ICD-10-CM | POA: Diagnosis not present

## 2021-06-08 DIAGNOSIS — E7800 Pure hypercholesterolemia, unspecified: Secondary | ICD-10-CM | POA: Diagnosis not present

## 2021-06-08 DIAGNOSIS — D649 Anemia, unspecified: Secondary | ICD-10-CM | POA: Diagnosis not present

## 2021-06-08 DIAGNOSIS — F17200 Nicotine dependence, unspecified, uncomplicated: Secondary | ICD-10-CM | POA: Diagnosis not present

## 2021-06-08 DIAGNOSIS — J410 Simple chronic bronchitis: Secondary | ICD-10-CM | POA: Diagnosis not present

## 2021-06-08 DIAGNOSIS — I10 Essential (primary) hypertension: Secondary | ICD-10-CM | POA: Diagnosis not present

## 2021-06-08 DIAGNOSIS — M545 Low back pain, unspecified: Secondary | ICD-10-CM | POA: Diagnosis not present

## 2021-06-08 DIAGNOSIS — M62838 Other muscle spasm: Secondary | ICD-10-CM | POA: Diagnosis not present

## 2021-07-07 DIAGNOSIS — D649 Anemia, unspecified: Secondary | ICD-10-CM | POA: Diagnosis not present

## 2021-07-31 ENCOUNTER — Other Ambulatory Visit: Payer: Self-pay

## 2021-07-31 ENCOUNTER — Ambulatory Visit: Payer: Self-pay

## 2021-07-31 ENCOUNTER — Ambulatory Visit (INDEPENDENT_AMBULATORY_CARE_PROVIDER_SITE_OTHER): Payer: Medicare HMO

## 2021-07-31 ENCOUNTER — Ambulatory Visit: Payer: Medicare HMO | Admitting: Orthopedic Surgery

## 2021-07-31 DIAGNOSIS — M25532 Pain in left wrist: Secondary | ICD-10-CM

## 2021-07-31 DIAGNOSIS — M25531 Pain in right wrist: Secondary | ICD-10-CM | POA: Diagnosis not present

## 2021-08-06 ENCOUNTER — Encounter: Payer: Self-pay | Admitting: Orthopedic Surgery
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
## 2021-12-13 DIAGNOSIS — I10 Essential (primary) hypertension: Secondary | ICD-10-CM | POA: Diagnosis not present

## 2021-12-13 DIAGNOSIS — Z1389 Encounter for screening for other disorder: Secondary | ICD-10-CM | POA: Diagnosis not present

## 2021-12-13 DIAGNOSIS — F331 Major depressive disorder, recurrent, moderate: Secondary | ICD-10-CM | POA: Diagnosis not present

## 2021-12-13 DIAGNOSIS — D692 Other nonthrombocytopenic purpura: Secondary | ICD-10-CM | POA: Diagnosis not present

## 2021-12-13 DIAGNOSIS — E7800 Pure hypercholesterolemia, unspecified: Secondary | ICD-10-CM | POA: Diagnosis not present

## 2021-12-13 DIAGNOSIS — E46 Unspecified protein-calorie malnutrition: Secondary | ICD-10-CM | POA: Diagnosis not present

## 2021-12-13 DIAGNOSIS — D649 Anemia, unspecified: Secondary | ICD-10-CM | POA: Diagnosis not present

## 2021-12-13 DIAGNOSIS — Z0000 Encounter for general adult medical examination without abnormal findings: Secondary | ICD-10-CM | POA: Diagnosis not present

## 2021-12-13 DIAGNOSIS — M62838 Other muscle spasm: Secondary | ICD-10-CM | POA: Diagnosis not present

## 2021-12-13 DIAGNOSIS — R32 Unspecified urinary incontinence: Secondary | ICD-10-CM | POA: Diagnosis not present

## 2021-12-17 DIAGNOSIS — S2020XA Contusion of thorax, unspecified, initial encounter: Secondary | ICD-10-CM | POA: Diagnosis not present

## 2021-12-17 DIAGNOSIS — S5001XA Contusion of right elbow, initial encounter: Secondary | ICD-10-CM | POA: Diagnosis not present

## 2021-12-17 DIAGNOSIS — M25521 Pain in right elbow: Secondary | ICD-10-CM | POA: Diagnosis not present

## 2021-12-20 DIAGNOSIS — B9689 Other specified bacterial agents as the cause of diseases classified elsewhere: Secondary | ICD-10-CM | POA: Diagnosis not present

## 2021-12-20 DIAGNOSIS — J441 Chronic obstructive pulmonary disease with (acute) exacerbation: Secondary | ICD-10-CM | POA: Diagnosis not present

## 2021-12-20 DIAGNOSIS — Z20822 Contact with and (suspected) exposure to covid-19: Secondary | ICD-10-CM | POA: Diagnosis not present

## 2021-12-20 DIAGNOSIS — J208 Acute bronchitis due to other specified organisms: Secondary | ICD-10-CM | POA: Diagnosis not present

## 2022-01-03 DIAGNOSIS — B029 Zoster without complications: Secondary | ICD-10-CM | POA: Diagnosis not present

## 2022-01-03 DIAGNOSIS — M545 Low back pain, unspecified: Secondary | ICD-10-CM | POA: Diagnosis not present

## 2022-01-14 ENCOUNTER — Inpatient Hospital Stay (HOSPITAL_BASED_OUTPATIENT_CLINIC_OR_DEPARTMENT_OTHER)
Admission: EM | Admit: 2022-01-14 | Discharge: 2022-01-17 | DRG: 074 | Disposition: A | Discharge status: Home / Self Care | Payer: Medicare HMO | Attending: Internal Medicine | Admitting: Internal Medicine

## 2022-01-14 ENCOUNTER — Emergency Department (HOSPITAL_BASED_OUTPATIENT_CLINIC_OR_DEPARTMENT_OTHER): Payer: Medicare HMO

## 2022-01-14 ENCOUNTER — Emergency Department (HOSPITAL_BASED_OUTPATIENT_CLINIC_OR_DEPARTMENT_OTHER): Payer: Medicare HMO | Admitting: Radiology

## 2022-01-14 ENCOUNTER — Encounter (HOSPITAL_BASED_OUTPATIENT_CLINIC_OR_DEPARTMENT_OTHER): Payer: Self-pay | Admitting: Obstetrics and Gynecology

## 2022-01-14 ENCOUNTER — Other Ambulatory Visit: Payer: Self-pay

## 2022-01-14 DIAGNOSIS — I10 Essential (primary) hypertension: Secondary | ICD-10-CM | POA: Diagnosis not present

## 2022-01-14 DIAGNOSIS — Z881 Allergy status to other antibiotic agents status: Secondary | ICD-10-CM | POA: Diagnosis not present

## 2022-01-14 DIAGNOSIS — M549 Dorsalgia, unspecified: Secondary | ICD-10-CM | POA: Diagnosis present

## 2022-01-14 DIAGNOSIS — R110 Nausea: Secondary | ICD-10-CM | POA: Diagnosis not present

## 2022-01-14 DIAGNOSIS — E876 Hypokalemia: Secondary | ICD-10-CM | POA: Diagnosis not present

## 2022-01-14 DIAGNOSIS — E860 Dehydration: Secondary | ICD-10-CM | POA: Diagnosis present

## 2022-01-14 DIAGNOSIS — J449 Chronic obstructive pulmonary disease, unspecified: Secondary | ICD-10-CM | POA: Diagnosis present

## 2022-01-14 DIAGNOSIS — I951 Orthostatic hypotension: Secondary | ICD-10-CM | POA: Diagnosis present

## 2022-01-14 DIAGNOSIS — R778 Other specified abnormalities of plasma proteins: Secondary | ICD-10-CM | POA: Diagnosis present

## 2022-01-14 DIAGNOSIS — E871 Hypo-osmolality and hyponatremia: Secondary | ICD-10-CM | POA: Diagnosis present

## 2022-01-14 DIAGNOSIS — E785 Hyperlipidemia, unspecified: Secondary | ICD-10-CM | POA: Diagnosis present

## 2022-01-14 DIAGNOSIS — Z8249 Family history of ischemic heart disease and other diseases of the circulatory system: Secondary | ICD-10-CM | POA: Diagnosis not present

## 2022-01-14 DIAGNOSIS — K831 Obstruction of bile duct: Secondary | ICD-10-CM | POA: Diagnosis not present

## 2022-01-14 DIAGNOSIS — M419 Scoliosis, unspecified: Secondary | ICD-10-CM | POA: Diagnosis present

## 2022-01-14 DIAGNOSIS — Z20822 Contact with and (suspected) exposure to covid-19: Secondary | ICD-10-CM | POA: Diagnosis present

## 2022-01-14 DIAGNOSIS — B0229 Other postherpetic nervous system involvement: Principal | ICD-10-CM | POA: Diagnosis present

## 2022-01-14 DIAGNOSIS — Z90710 Acquired absence of both cervix and uterus: Secondary | ICD-10-CM

## 2022-01-14 DIAGNOSIS — R42 Dizziness and giddiness: Secondary | ICD-10-CM | POA: Diagnosis not present

## 2022-01-14 DIAGNOSIS — R933 Abnormal findings on diagnostic imaging of other parts of digestive tract: Secondary | ICD-10-CM | POA: Diagnosis not present

## 2022-01-14 DIAGNOSIS — R636 Underweight: Secondary | ICD-10-CM | POA: Diagnosis not present

## 2022-01-14 DIAGNOSIS — K8050 Calculus of bile duct without cholangitis or cholecystitis without obstruction: Secondary | ICD-10-CM

## 2022-01-14 DIAGNOSIS — G8929 Other chronic pain: Secondary | ICD-10-CM | POA: Diagnosis present

## 2022-01-14 DIAGNOSIS — R932 Abnormal findings on diagnostic imaging of liver and biliary tract: Secondary | ICD-10-CM | POA: Diagnosis not present

## 2022-01-14 DIAGNOSIS — R9431 Abnormal electrocardiogram [ECG] [EKG]: Secondary | ICD-10-CM | POA: Diagnosis present

## 2022-01-14 DIAGNOSIS — I700 Atherosclerosis of aorta: Secondary | ICD-10-CM | POA: Diagnosis not present

## 2022-01-14 DIAGNOSIS — I959 Hypotension, unspecified: Secondary | ICD-10-CM | POA: Diagnosis not present

## 2022-01-14 DIAGNOSIS — Z681 Body mass index (BMI) 19 or less, adult: Secondary | ICD-10-CM | POA: Diagnosis not present

## 2022-01-14 DIAGNOSIS — R1084 Generalized abdominal pain: Secondary | ICD-10-CM | POA: Diagnosis not present

## 2022-01-14 DIAGNOSIS — R935 Abnormal findings on diagnostic imaging of other abdominal regions, including retroperitoneum: Secondary | ICD-10-CM | POA: Diagnosis not present

## 2022-01-14 DIAGNOSIS — R112 Nausea with vomiting, unspecified: Secondary | ICD-10-CM | POA: Diagnosis not present

## 2022-01-14 DIAGNOSIS — E7800 Pure hypercholesterolemia, unspecified: Secondary | ICD-10-CM | POA: Diagnosis present

## 2022-01-14 DIAGNOSIS — Z888 Allergy status to other drugs, medicaments and biological substances status: Secondary | ICD-10-CM

## 2022-01-14 DIAGNOSIS — Z79899 Other long term (current) drug therapy: Secondary | ICD-10-CM | POA: Diagnosis not present

## 2022-01-14 DIAGNOSIS — K838 Other specified diseases of biliary tract: Secondary | ICD-10-CM | POA: Diagnosis not present

## 2022-01-14 DIAGNOSIS — Z87891 Personal history of nicotine dependence: Secondary | ICD-10-CM

## 2022-01-14 DIAGNOSIS — Z880 Allergy status to penicillin: Secondary | ICD-10-CM | POA: Diagnosis not present

## 2022-01-14 DIAGNOSIS — Z87440 Personal history of urinary (tract) infections: Secondary | ICD-10-CM

## 2022-01-14 DIAGNOSIS — Z8673 Personal history of transient ischemic attack (TIA), and cerebral infarction without residual deficits: Secondary | ICD-10-CM

## 2022-01-14 DIAGNOSIS — Z8711 Personal history of peptic ulcer disease: Secondary | ICD-10-CM

## 2022-01-14 DIAGNOSIS — B349 Viral infection, unspecified: Secondary | ICD-10-CM | POA: Diagnosis not present

## 2022-01-14 DIAGNOSIS — K828 Other specified diseases of gallbladder: Secondary | ICD-10-CM | POA: Diagnosis not present

## 2022-01-14 DIAGNOSIS — R109 Unspecified abdominal pain: Secondary | ICD-10-CM | POA: Diagnosis not present

## 2022-01-14 LAB — URINALYSIS, ROUTINE W REFLEX MICROSCOPIC
Bilirubin Urine: NEGATIVE
Glucose, UA: NEGATIVE mg/dL
Hgb urine dipstick: NEGATIVE
Ketones, ur: NEGATIVE mg/dL
Leukocytes,Ua: NEGATIVE
Nitrite: NEGATIVE
Specific Gravity, Urine: 1.038 — ABNORMAL HIGH (ref 1.005–1.030)
pH: 6.5 (ref 5.0–8.0)

## 2022-01-14 LAB — CBC
HCT: 46.1 % — ABNORMAL HIGH (ref 36.0–46.0)
Hemoglobin: 15.1 g/dL — ABNORMAL HIGH (ref 12.0–15.0)
MCH: 32.1 pg (ref 26.0–34.0)
MCHC: 32.8 g/dL (ref 30.0–36.0)
MCV: 98.1 fL (ref 80.0–100.0)
Platelets: 228 10*3/uL (ref 150–400)
RBC: 4.7 MIL/uL (ref 3.87–5.11)
RDW: 13.6 % (ref 11.5–15.5)
WBC: 13.8 10*3/uL — ABNORMAL HIGH (ref 4.0–10.5)
nRBC: 0 % (ref 0.0–0.2)

## 2022-01-14 LAB — RESP PANEL BY RT-PCR (FLU A&B, COVID) ARPGX2
Influenza A by PCR: NEGATIVE
Influenza B by PCR: NEGATIVE
SARS Coronavirus 2 by RT PCR: NEGATIVE

## 2022-01-14 LAB — BASIC METABOLIC PANEL
Anion gap: 11 (ref 5–15)
BUN: 24 mg/dL — ABNORMAL HIGH (ref 8–23)
CO2: 33 mmol/L — ABNORMAL HIGH (ref 22–32)
Calcium: 9.9 mg/dL (ref 8.9–10.3)
Chloride: 89 mmol/L — ABNORMAL LOW (ref 98–111)
Creatinine, Ser: 0.97 mg/dL (ref 0.44–1.00)
GFR, Estimated: >60 mL/min (ref >60)
Glucose, Bld: 100 mg/dL — ABNORMAL HIGH (ref 70–99)
Potassium: 3.4 mmol/L — ABNORMAL LOW (ref 3.5–5.1)
Sodium: 133 mmol/L — ABNORMAL LOW (ref 135–145)

## 2022-01-14 LAB — TROPONIN I (HIGH SENSITIVITY): Troponin I (High Sensitivity): 19 ng/L — ABNORMAL HIGH (ref <18)

## 2022-01-14 LAB — HEPATIC FUNCTION PANEL
ALT: 38 U/L (ref 0–44)
AST: 68 U/L — ABNORMAL HIGH (ref 15–41)
Albumin: 4.1 g/dL (ref 3.5–5.0)
Alkaline Phosphatase: 51 U/L (ref 38–126)
Bilirubin, Direct: <0.1 mg/dL (ref 0.0–0.2)
Total Bilirubin: 0.4 mg/dL (ref 0.3–1.2)
Total Protein: 7.1 g/dL (ref 6.5–8.1)

## 2022-01-14 LAB — LIPASE, BLOOD: Lipase: 27 U/L (ref 11–51)

## 2022-01-14 MED ORDER — PROCHLORPERAZINE EDISYLATE 10 MG/2ML IJ SOLN: 10.0000 mg | Freq: Four times a day (QID) | INTRAMUSCULAR | Status: DC | PRN

## 2022-01-14 MED ORDER — IOHEXOL 300 MG/ML  SOLN
100.0000 mL | Freq: Once | INTRAMUSCULAR | Status: AC | PRN
  Administered 2022-01-14: 80 mL via INTRAVENOUS

## 2022-01-14 MED ORDER — POTASSIUM CHLORIDE 2 MEQ/ML IV SOLN
INTRAVENOUS | Status: DC
  Filled 2022-01-14 (×11): qty 1000

## 2022-01-14 MED ORDER — ACETAMINOPHEN 500 MG PO TABS
1000.0000 mg | ORAL_TABLET | Freq: Once | ORAL | Status: AC
  Administered 2022-01-14: 1000 mg via ORAL
  Filled 2022-01-14: qty 2

## 2022-01-14 MED ORDER — ACETAMINOPHEN 650 MG RE SUPP: 650.0000 mg | Freq: Four times a day (QID) | RECTAL | Status: DC | PRN

## 2022-01-14 MED ORDER — IPRATROPIUM-ALBUTEROL 0.5-2.5 (3) MG/3ML IN SOLN: 3.0000 mL | Freq: Four times a day (QID) | RESPIRATORY_TRACT | Status: DC | PRN

## 2022-01-14 MED ORDER — HYDROMORPHONE HCL 1 MG/ML IJ SOLN
0.5000 mg | INTRAMUSCULAR | Status: AC | PRN
  Administered 2022-01-14 – 2022-01-15 (×4): 0.5 mg via INTRAVENOUS
  Filled 2022-01-14 (×4): qty 0.5

## 2022-01-14 MED ORDER — SODIUM CHLORIDE 0.9 % IV BOLUS
1000.0000 mL | Freq: Once | INTRAVENOUS | Status: AC
  Administered 2022-01-14: 1000 mL via INTRAVENOUS

## 2022-01-14 MED ORDER — ENOXAPARIN SODIUM 40 MG/0.4ML IJ SOSY
40.0000 mg | PREFILLED_SYRINGE | INTRAMUSCULAR | Status: DC
  Administered 2022-01-15: 40 mg via SUBCUTANEOUS
  Filled 2022-01-14: qty 0.4

## 2022-01-14 MED ORDER — ACETAMINOPHEN 325 MG PO TABS: 650.0000 mg | ORAL_TABLET | Freq: Four times a day (QID) | ORAL | Status: DC | PRN

## 2022-01-14 NOTE — ED Notes (Signed)
Marie Lyons, ex-husband with patient today - [phone].

## 2022-01-14 NOTE — H&P (Signed)
History and Physical    Marie Lyons ZGY:[id number] DOB: 1950-12-31 DOA: 01/14/2022  PCP: Maury Dus, MD   Patient coming from: Home via [facility]  Chief Complaint: Fatigue, nausea, vomiting, abdominal pain  HPI: Marie Lyons is a 72 y.o. female with medical history significant for COPD, HTN, cardiac arrhythmia, HLD who presented to [facility] ER from a local urgent care with complaint of abdominal pain, nausea vomiting, fatigue, decreased appetite and lightheadedness when she stood up.  She is found to have orthostatic hypotension with blood pressure of 78/50 when standing at the urgent care and was sent to the emergency room.  She reports that for the last 4 to 5 days she has been having abdominal pain in the upper middle and right upper abdomen associated with nausea and episodes of vomiting.  Vomiting would occur shortly after eating and she has been not been able to tolerate much p.o. intake for the last 2 to 3 days.  She reports that yesterday she was able to tolerate some sips of Gatorade Coke and sweet tea.  She never had blood in her vomit.  She denies any diarrhea.  She denies any fever or chills.  She has not had any urinary frequency or dysuria. She was diagnosed with shingles 2 weeks ago by her primary physician and treated with Valtrex and steroids and states that it is improved.   ED Course: [name] Setting was hemodynamically stable in the emergency room.  She was found on CT scan to have choledocholithiasis with stones in the distal common bile duct and some biliary dilatation.  ER physician discussed with gastroenterology recommended admission and MRCP.  Lab work reveals WBC of 13,800 hemoglobin 15.1 hematocrit 46.1 platelets 228,000; troponin 19, sodium 133 potassium 3.4 chloride 89 bicarb 33 glucose 100 creatinine 0.97 BUN 24 calcium 9.9 alkaline phosphatase 51 AST 68 ALT 38 lipase 27 bilirubin 0.4, with negative.  Influenza A and B negative.  Hospitalist service asked to  admit patient for further management  Review of Systems:  General: Reports fatigue, decreased appetite. Reports intermittent dizziness. Denies fever, chills, weight loss, night sweats. HENT: Denies head trauma, headache, denies tinnitus. Denies nasal congestion or bleeding. Denies sore throat, Denies difficulty swallowing Eyes: Denies blurry vision, pain in eye, drainage. Denies discoloration of eyes. Neck: Denies pain.  Denies swelling. Denies pain with movement. Cardiovascular: Denies chest pain, palpitations. Denies edema.  Denies orthopnea Respiratory: Denies shortness of breath, cough. Denies wheezing.  Denies sputum production Gastrointestinal: Reports abdominal pain, nausea, vomiting. Denies diarrhea.  Denies melena.  Denies hematemesis. Musculoskeletal: Denies limitation of movement. Denies deformity or swelling. Denies myalgias. Genitourinary: Denies pelvic pain. Denies urinary frequency or hesitancy. Denies dysuria.  Skin: Denies rash.  Denies petechiae, purpura, ecchymosis. Neurological: Denies syncope. Denies seizure activity. Denies paresthesia. Denies slurred speech, drooping face.  Denies visual change. Psychiatric: Denies depression, anxiety.    Past Medical History:  Diagnosis Date   Cardiac arrhythmia    Chronic back pain    High cholesterol    Hypertension    Migraine    Scoliosis    Stroke [facility])     Past Surgical History:  Procedure Laterality Date   ABDOMINAL HYSTERECTOMY     BUNIONECTOMY     I & D EXTREMITY Left 03/09/2021   Procedure: IRRIGATION AND DEBRIDEMENT LEFT  WRIST AND LEFT HAND;  Surgeon: Iran Planas, MD;  Location: [place];  Service: Orthopedics;  Laterality: Left;   KNEE ARTHROSCOPY WITH MEDIAL MENISECTOMY Right 03/12/2021   Procedure:  KNEE ARTHROSCOPY WITH MEDIAL MENISECTOMY; PLACEMENT OF ANTIBIOTIC BEADS;  Surgeon: Meredith Pel, MD;  Location: [place];  Service: Orthopedics;  Laterality: Right;   ROTATOR CUFF REPAIR      Social History   reports that she has quit smoking. Her smoking use included cigarettes. She has a 12.50 pack-year smoking history. She has never used smokeless tobacco. She reports that she does not drink alcohol and does not use drugs.  Allergies  Allergen Reactions   Ciprofloxacin Shortness Of Breath    Denies this allergy on 09/23/16 (??)   Keflex [Cephalexin] Anaphylaxis   Penicillins Anaphylaxis    Has patient had a PCN reaction causing immediate rash, facial/tongue/throat swelling, SOB or lightheadedness with hypotension: Yes Has patient had a PCN reaction causing severe rash involving mucus membranes or skin necrosis: Yes Has patient had a PCN reaction that required hospitalization Yes Has patient had a PCN reaction occurring within the last 10 years: No If all of the above answers are "NO", then may proceed with Cephalosporin use.    Aspirin Other (See Comments)    Aggravates ulcer   Darvon [Propoxyphene Hcl] Other (See Comments)    Wheezing   Ibuprofen Other (See Comments)    Wheezing   Imitrex [Sumatriptan] Other (See Comments)    Contraindicated because pt had a stroke   Meperidine Other (See Comments)    Unknown reaction (per patient)   Monosodium Glutamate Other (See Comments)    Migraine    Nitrates, Organic Other (See Comments)    Migraine    Nsaids Other (See Comments)    Aggravates ulcer   Sulfa Antibiotics Other (See Comments)    Unknown reaction (per patient)    Family History  Problem Relation Age of Onset   CAD Father    Stroke Neg Hx      Prior to Admission medications   Medication Sig Start Date End Date Taking? Authorizing Provider  acetaminophen (TYLENOL) 500 MG tablet Take 500 mg by mouth every 6 (six) hours as needed for headache (or pain).    Yes [provider]  ARTIFICIAL TEAR OP Place 1 drop into both eyes daily.   Yes [provider]  calcium elemental as carbonate (BARIATRIC TUMS ULTRA) 400 MG chewable tablet Chew 1,000 mg by mouth daily.    Yes [provider]  cholecalciferol (VITAMIN D) 1000 UNITS tablet Take 1,000 Units by mouth daily.    Yes [provider]  cyclobenzaprine (FLEXERIL) 10 MG tablet Take 10 mg by mouth at bedtime. 11/14/17  Yes [provider]  diphenhydrAMINE (BENADRYL) 25 MG tablet Take 1 tablet (25 mg total) by mouth every 6 (six) hours as needed. Patient taking differently: Take 25 mg by mouth every 6 (six) hours as needed for allergies. 12/16/20  Yes Aberman, Caroline C, PA-C  escitalopram (LEXAPRO) 20 MG tablet Take 20 mg by mouth daily. 11/14/17  Yes [provider]  estradiol (VIVELLE-DOT) 0.1 MG/24HR patch Place 1 patch onto the skin 2 (two) times a week.   Yes [provider]  fluticasone (FLONASE) 50 MCG/ACT nasal spray Place 2 sprays into the nose daily. Patient taking differently: Place 2 sprays into the nose daily as needed for allergies. 07/25/13  Yes Roselee Culver, MD  HYDROcodone-acetaminophen (NORCO/VICODIN) 5-325 MG tablet Take 1 tablet by mouth every 6 (six) hours as needed for moderate pain or severe pain. 03/23/21  Yes [provider]  Melatonin 10 MG TABS Take 1 tablet by mouth at bedtime.  Yes [provider]  metoprolol succinate (TOPROL-XL) 25 MG 24 hr tablet Take 25 mg by mouth at bedtime.   Yes [provider]  Multiple Vitamin (MULTIVITAMIN WITH MINERALS) TABS tablet Take 1 tablet by mouth daily. 03/11/21  Yes Mercy Riding, MD  ondansetron (ZOFRAN-ODT) 8 MG disintegrating tablet Take 8 mg by mouth every 8 (eight) hours as needed for nausea or vomiting. 01/12/22  Yes [provider]  predniSONE (DELTASONE) 10 MG tablet Take 10-40 mg by mouth as directed. Take 4 tablets (40 mg) for 5 Days, Take 3 tablets (30 mg) for 5 Days, Take 2 tablets (20 mg) for 5 Days, Take 1 tablet (10 mg) for 5 Days. 01/03/22  Yes [provider]  simvastatin (ZOCOR) 20 MG tablet Take 20 mg by mouth at bedtime.   Yes  [provider]  valACYclovir (VALTREX) 1000 MG tablet Take 1,000 mg by mouth every 8 (eight) hours. 01/03/22  Yes [provider]  VENTOLIN HFA 108 (90 Base) MCG/ACT inhaler Inhale 2 puffs into the lungs every 4 (four) hours as needed for wheezing or shortness of breath. 10/17/17  Yes [provider]  vitamin C (ASCORBIC ACID) 250 MG tablet Take 250 mg by mouth daily.   Yes [provider]  EPINEPHrine 0.3 mg/0.3 mL IJ SOAJ injection Inject 0.3 mg into the muscle as needed for anaphylaxis. 12/16/20   Suzy Bouchard, PA-C  folic acid (FOLVITE) 1 MG tablet Take 1 tablet (1 mg total) by mouth daily. Patient not taking: Reported on 01/14/2022 03/11/21   Mercy Riding, MD    Physical Exam: Vitals:   01/14/22 1730 01/14/22 1801 01/14/22 2000 01/14/22 2200  BP: 126/78 (!) 127/91 119/72 120/72  Pulse: 83 83  82  Resp: 20 14 20 18   Temp:    98.7 F (37.1 C)  TempSrc:    Oral  SpO2: 97% 99% 99% 97%  Height:        Constitutional: NAD, calm, comfortable Vitals:   01/14/22 1730 01/14/22 1801 01/14/22 2000 01/14/22 2200  BP: 126/78 (!) 127/91 119/72 120/72  Pulse: 83 83  82  Resp: 20 14 20 18   Temp:    98.7 F (37.1 C)  TempSrc:    Oral  SpO2: 97% 99% 99% 97%  Height:       General: WDWN, Alert and oriented x3.  Eyes: EOMI, PERRL, conjunctivae normal.  Sclera nonicteric HENT:  [place]/AT, external ears normal.  Nares patent without epistasis.  Mucous membranes are dry Neck: Soft, normal range of motion, supple, no masses,Trachea midline Respiratory: clear to auscultation bilaterally, no wheezing, no crackles. Normal respiratory effort.  Cardiovascular: Regular rate and rhythm, no murmurs / rubs / gallops. No extremity edema. 2+ pedal pulses.  Abdomen: Soft, Epigastric and RUQ tenderness, nondistended, no rebound or guarding.  No masses palpated. No hepatosplenomegaly. Bowel sounds hypoactive Musculoskeletal: FROM. no cyanosis. Normal muscle tone.  Skin:  Warm, dry, intact. Healing shingles on left flank to left mid abdomen. No drainage or vesicles. No induration Neurologic: CN 2-12 grossly intact.  Normal speech. Sensation intact to touch Psychiatric: Normal judgment and insight. Normal mood.   Labs on Admission: I have personally reviewed following labs and imaging studies  CBC: Recent Labs  Lab 01/14/22 1343  WBC 13.8*  HGB 15.1*  HCT 46.1*  MCV 98.1  PLT 341    Basic Metabolic Panel: Recent Labs  Lab 01/14/22 1343  NA 133*  K 3.4*  CL 89*  CO2  33*  GLUCOSE 100*  BUN 24*  CREATININE 0.97  CALCIUM 9.9    GFR: CrCl cannot be calculated (Unknown ideal weight.).  Liver Function Tests: Recent Labs  Lab 01/14/22 1515  AST 68*  ALT 38  ALKPHOS 51  BILITOT 0.4  PROT 7.1  ALBUMIN 4.1    Urine analysis:    Component Value Date/Time   COLORURINE YELLOW 01/14/2022 [place] 01/14/2022 1748   LABSPEC 1.038 (H) 01/14/2022 1748   PHURINE 6.5 01/14/2022 1748   GLUCOSEU NEGATIVE 01/14/2022 1748   HGBUR NEGATIVE 01/14/2022 [place] 01/14/2022 1748   KETONESUR NEGATIVE 01/14/2022 1748   PROTEINUR TRACE (A) 01/14/2022 1748   UROBILINOGEN 1.0 09/09/2012 2015   NITRITE NEGATIVE 01/14/2022 1748   LEUKOCYTESUR NEGATIVE 01/14/2022 1748    Radiological Exams on Admission: DG Chest 2 View  Result Date: 01/14/2022 CLINICAL DATA:  Patient reports to the ER for lack of PO intake, dizziness and nausea. Patient reports this started x5 days ago. Friday night had emesis. EXAM: CHEST - 2 VIEW COMPARISON:  05/30/2021. FINDINGS: Cardiac silhouette is normal in size. No mediastinal or hilar masses or evidence of adenopathy. Lungs are hyperexpanded, but clear. No pleural effusion or pneumothorax. Stable thoracolumbar scoliosis. Skeletal structures are grossly intact. IMPRESSION: No acute cardiopulmonary disease. Electronically Signed   By: Lajean Manes M.D.   On: 01/14/2022 16:03   CT ABDOMEN PELVIS  W CONTRAST  Result Date: 01/14/2022 CLINICAL DATA:  Acute abdominal pain and nausea. EXAM: CT ABDOMEN AND PELVIS WITH CONTRAST TECHNIQUE: Multidetector CT imaging of the abdomen and pelvis was performed using the standard protocol following bolus administration of intravenous contrast. RADIATION DOSE REDUCTION: This exam was performed according to the departmental dose-optimization program which includes automated exposure control, adjustment of the mA and/or kV according to patient size and/or use of iterative reconstruction technique. CONTRAST:  63mL OMNIPAQUE IOHEXOL 300 MG/ML  SOLN COMPARISON:  Noncontrast CT on 11/18/2020 FINDINGS: Lower Chest: No acute findings. Hepatobiliary: No hepatic masses identified. Gallbladder is unremarkable. Mild diffuse biliary ductal dilatation is seen, with common bile duct measuring 10 mm in diameter. Two tiny radiodensities are seen in the distal common bile duct, highly suspicious for choledocholithiasis. Pancreas:  No mass or inflammatory changes. Spleen: Within normal limits in size and appearance. Adrenals/Urinary Tract: No masses identified. Tiny subcapsular left renal cyst noted. No evidence of ureteral calculi or hydronephrosis. Stomach/Bowel: No evidence of obstruction, inflammatory process or abnormal fluid collections. Normal appendix visualized. Vascular/Lymphatic: No pathologically enlarged lymph nodes. No acute vascular findings. Aortic atherosclerotic calcification noted. Reproductive: Prior hysterectomy noted. Adnexal regions are unremarkable in appearance. Other:  None. Musculoskeletal: No suspicious bone lesions identified. Lumbar spine degenerative changes and dextroscoliosis noted. IMPRESSION: Mild diffuse biliary ductal dilatation, with tiny radiodensities in distal common bile duct, highly suspicious for choledocholithiasis. Aortic Atherosclerosis (ICD10-I70.0). Electronically Signed   By: Marlaine Hind M.D.   On: 01/14/2022 16:04    EKG: Independently  reviewed.  Shows normal sinus rhythm.  Right atrial enlargement.  No acute ST elevation or depression.  QTc prolonged at 493  Assessment/Plan Principal Problem:   Biliary obstruction Ms. Padget is admitted to [facility] with biliary obstruction with sontes in distal common bile duct on CT abdomen/pelvis.  GI consulted and will see in am.  MRCP ordered to determine if stone causing obstruction and if positive will need ERCP.  Pain control with dilaudid overnight. Compazine for nausea IVF hydration with LR with 20 KCL at 100  ml/hr overnight NPO for MRCP  Active Problems:   Choledocholithiasis MRCP ordered    Essential hypertension BP stable. Monitor BP.  Will resume metoprolol when diet resumed and can tolerate po intake.     Hypokalemia Replace potassium in IVF. Check magnesium level and if low will replace Secondary to vomiting past few days.     Hyponatremia IVF hydration. Secondary to vomiting/dehydration    COPD (chronic obstructive pulmonary disease) Chronic. Stable.     Prolonged QT interval Avoid medications which could further prolong QT interval       Elevated Troponin  Mildly elevated troponin in ER x1. No chest pain or SOB. Will check troponin to trend. No ischemic changes on EKG  DVT prophylaxis: Lovenox for DVT prophylaxis Code Status:   Full code Family Communication:  Diagnosis and plan discussed with patient.  Patient verbalized understanding agrees with plan.  Further recommendations as clinical indicated Disposition Plan:   Patient is from:  Home  Anticipated DC to:  Home  Anticipated DC date:  Anticipate 2 midnight or more stay in the hospital  Time spent:   55 minutes  Consults called:  Gastroenterology-Out[facility] GI  Admission status:  Inpatient  Yevonne Aline Raiyah Speakman MD [facility]  How to contact the [facility] Attending or Consulting provider [place] or covering provider during after hours [place], for this patient?   Check the care team in [facility] and  look for a) attending/consulting [facility] provider listed and b) the [facility] team listed Log into [email or web address] and use [place] universal password to access. If you do not have the password, please contact the hospital operator. Locate the [facility] provider you are looking for under [facility] and page to a number that you can be directly reached. If you still have difficulty reaching the provider, please page the [facility] (Director on Call) for the Hospitalists listed on amion for assistance.  01/14/2022, 10:11 PM

## 2022-01-14 NOTE — ED Triage Notes (Signed)
Patient reports to the ER for lack of PO intake, dizziness and nausea. Patient reports this started x5 days ago. Friday night had emesis.

## 2022-01-14 NOTE — ED Provider Notes (Signed)
[facility] EMERGENCY DEPT Provider Note   CSN: [id number] Arrival date & time: 01/14/22  1259     History  Chief Complaint  Patient presents with   Dizziness    Marie Lyons is a 72 y.o. female presenting to ED with fatigue, loss of appetite, lightheadedness.  Patient reports she has felt unwell for 5 days, beginning with left lower chest pain and left CVA pain 5 days ago, associated with nausea, several episodes vomiting 3 days ago.  Vomiting has improved, tolerating sips of gatorade and coke and sweet tea, doesn't really drink water much.    Went to UC where she was found to have +orthostatic hypotension, BP 78/50 on standing, and went to Ed for evaluation.  She reports she had shingles 2 weeks ago, treated with valtrex and steroids, rash was on mid left back wrapping to left abdomen.  First shingles outbreak.  No spread anywhere else.  She denies active pain, chest pain. Hx of UTI's but does not have active symptoms Reports hx of asymptomatic "kidney infections"in the past  Denies hx of abdominal surgeries  She was hospitalized for a dog bite cellulitis in March, complicated by inflammation of her right knee, felt to be POSSIBLY a septic joint, although aspirate did not grow bacteria, this was performed after starting antibiotics.  She underwent antibiotics for that.  She denies worsening knee pain or swelling.  March 2022 discharge summary: Patient developed acute right knee pain with effusion and difficulty weightbearing on 3/12.  X-ray and US showed a complex right knee effusion.  Underwent needle aspiration by Ortho.  Although fluid study does not suggest infection, concern for septic arthritis remained high as she was on IV antibiotic prior to aspiration. Underwent arthroscopic washout on 3/13. She will continue IV antibiotics as below per ID recommendations for 4 weeks.   HPI     Home Medications Prior to Admission medications   Medication Sig Start  Date End Date Taking? Authorizing Provider  acetaminophen (TYLENOL) 500 MG tablet Take 500 mg by mouth every 6 (six) hours as needed for headache (or pain).    Yes [provider]  ARTIFICIAL TEAR OP Place 1 drop into both eyes daily.   Yes [provider]  calcium elemental as carbonate (BARIATRIC TUMS ULTRA) 400 MG chewable tablet Chew 1,000 mg by mouth daily.   Yes [provider]  cholecalciferol (VITAMIN D) 1000 UNITS tablet Take 1,000 Units by mouth daily.    Yes [provider]  cyclobenzaprine (FLEXERIL) 10 MG tablet Take 10 mg by mouth at bedtime. 11/14/17  Yes [provider]  diphenhydrAMINE (BENADRYL) 25 MG tablet Take 1 tablet (25 mg total) by mouth every 6 (six) hours as needed. Patient taking differently: Take 25 mg by mouth every 6 (six) hours as needed for allergies. 12/16/20  Yes Aberman, Caroline C, PA-C  escitalopram (LEXAPRO) 20 MG tablet Take 20 mg by mouth daily. 11/14/17  Yes [provider]  estradiol (VIVELLE-DOT) 0.1 MG/24HR patch Place 1 patch onto the skin 2 (two) times a week.   Yes [provider]  fluticasone (FLONASE) 50 MCG/ACT nasal spray Place 2 sprays into the nose daily. Patient taking differently: Place 2 sprays into the nose daily as needed for allergies. 07/25/13  Yes Roselee Culver, MD  HYDROcodone-acetaminophen (NORCO/VICODIN) 5-325 MG tablet Take 1 tablet by mouth every 6 (six) hours as needed for moderate pain or severe pain. 03/23/21  Yes [provider]  Melatonin 10 MG TABS  Take 1 tablet by mouth at bedtime.   Yes [provider]  metoprolol succinate (TOPROL-XL) 25 MG 24 hr tablet Take 25 mg by mouth at bedtime.   Yes [provider]  Multiple Vitamin (MULTIVITAMIN WITH MINERALS) TABS tablet Take 1 tablet by mouth daily. 03/11/21  Yes Mercy Riding, MD  ondansetron (ZOFRAN-ODT) 8 MG disintegrating tablet Take 8 mg by mouth every 8 (eight) hours as needed for  nausea or vomiting. 01/12/22  Yes [provider]  predniSONE (DELTASONE) 10 MG tablet Take 10-40 mg by mouth as directed. Take 4 tablets (40 mg) for 5 Days, Take 3 tablets (30 mg) for 5 Days, Take 2 tablets (20 mg) for 5 Days, Take 1 tablet (10 mg) for 5 Days. 01/03/22  Yes [provider]  simvastatin (ZOCOR) 20 MG tablet Take 20 mg by mouth at bedtime.   Yes [provider]  valACYclovir (VALTREX) 1000 MG tablet Take 1,000 mg by mouth every 8 (eight) hours. 01/03/22  Yes [provider]  VENTOLIN HFA 108 (90 Base) MCG/ACT inhaler Inhale 2 puffs into the lungs every 4 (four) hours as needed for wheezing or shortness of breath. 10/17/17  Yes [provider]  vitamin C (ASCORBIC ACID) 250 MG tablet Take 250 mg by mouth daily.   Yes [provider]  EPINEPHrine 0.3 mg/0.3 mL IJ SOAJ injection Inject 0.3 mg into the muscle as needed for anaphylaxis. 12/16/20   Suzy Bouchard, PA-C  folic acid (FOLVITE) 1 MG tablet Take 1 tablet (1 mg total) by mouth daily. Patient not taking: Reported on 01/14/2022 03/11/21   Mercy Riding, MD      Allergies    Ciprofloxacin; Keflex [cephalexin]; Penicillins; Aspirin; Darvon [propoxyphene hcl]; Ibuprofen; Imitrex [sumatriptan]; Meperidine; Monosodium glutamate; Nitrates, organic; Nsaids; and Sulfa antibiotics    Review of Systems   Review of Systems  Physical Exam Updated Vital Signs BP 112/70 (BP Location: Left Arm)    Pulse 90    Temp 97.6 F (36.4 C)    Resp 16    Ht 5\' 3"  (1.6 m)    SpO2 94%    BMI 17.54 kg/m  Physical Exam Constitutional:      General: She is not in acute distress. HENT:     Head: Normocephalic and atraumatic.  Eyes:     Conjunctiva/sclera: Conjunctivae normal.     Pupils: Pupils are equal, round, and reactive to light.  Cardiovascular:     Rate and Rhythm: Normal rate and regular rhythm.     Pulses: Normal pulses.  Pulmonary:     Effort: Pulmonary effort is normal. No  respiratory distress.  Abdominal:     General: There is no distension.     Tenderness: There is no abdominal tenderness. There is no guarding.  Skin:    General: Skin is warm and dry.     Comments: Healing shingles rash in dermatomal distribution left flank to left mid-abdomen  Neurological:     General: No focal deficit present.     Mental Status: She is alert and oriented to person, place, and time. Mental status is at baseline.  Psychiatric:        Mood and Affect: Mood normal.        Behavior: Behavior normal.    ED Results / Procedures / Treatments   Labs (all labs ordered are listed, but only abnormal results are displayed) Labs Reviewed  BASIC METABOLIC PANEL - Abnormal; Notable for the following components:  Result Value   Sodium 133 (*)    Potassium 3.4 (*)    Chloride 89 (*)    CO2 33 (*)    Glucose, Bld 100 (*)    BUN 24 (*)    All other components within normal limits  CBC - Abnormal; Notable for the following components:   WBC 13.8 (*)    Hemoglobin 15.1 (*)    HCT 46.1 (*)    All other components within normal limits  URINALYSIS, ROUTINE W REFLEX MICROSCOPIC - Abnormal; Notable for the following components:   Specific Gravity, Urine 1.038 (*)    Protein, ur TRACE (*)    All other components within normal limits  HEPATIC FUNCTION PANEL - Abnormal; Notable for the following components:   AST 68 (*)    All other components within normal limits  COMPREHENSIVE METABOLIC PANEL - Abnormal; Notable for the following components:   Sodium 134 (*)    Potassium 3.1 (*)    Chloride 97 (*)    Calcium 8.2 (*)    Total Protein 5.7 (*)    Albumin 3.3 (*)    AST 50 (*)    All other components within normal limits  CBC - Abnormal; Notable for the following components:   RBC 3.86 (*)    All other components within normal limits  TROPONIN I (HIGH SENSITIVITY) - Abnormal; Notable for the following components:   Troponin I (High Sensitivity) 19 (*)    All other  components within normal limits  RESP PANEL BY RT-PCR (FLU A&B, COVID) ARPGX2  CULTURE, BLOOD (SINGLE)  LIPASE, BLOOD  MAGNESIUM  TROPONIN I (HIGH SENSITIVITY)  TROPONIN I (HIGH SENSITIVITY)    EKG EKG Interpretation  Date/Time:  Sunday January 14 2022 14:08:38 EST Ventricular Rate:  89 PR Interval:  128 QRS Duration: 66 QT Interval:  406 QTC Calculation: 493 R Axis:   54 Text Interpretation: Normal sinus rhythm Right atrial enlargement Prolonged QT , borderline no STEMI  No sig changes from Feb 2018 ecg Confirmed by Octaviano Glow [phone]) on 01/14/2022 2:59:10 PM  Radiology DG Chest 2 View  Result Date: 01/14/2022 CLINICAL DATA:  Patient reports to the ER for lack of PO intake, dizziness and nausea. Patient reports this started x5 days ago. Friday night had emesis. EXAM: CHEST - 2 VIEW COMPARISON:  05/30/2021. FINDINGS: Cardiac silhouette is normal in size. No mediastinal or hilar masses or evidence of adenopathy. Lungs are hyperexpanded, but clear. No pleural effusion or pneumothorax. Stable thoracolumbar scoliosis. Skeletal structures are grossly intact. IMPRESSION: No acute cardiopulmonary disease. Electronically Signed   By: Lajean Manes M.D.   On: 01/14/2022 16:03   CT ABDOMEN PELVIS W CONTRAST  Result Date: 01/14/2022 CLINICAL DATA:  Acute abdominal pain and nausea. EXAM: CT ABDOMEN AND PELVIS WITH CONTRAST TECHNIQUE: Multidetector CT imaging of the abdomen and pelvis was performed using the standard protocol following bolus administration of intravenous contrast. RADIATION DOSE REDUCTION: This exam was performed according to the departmental dose-optimization program which includes automated exposure control, adjustment of the mA and/or kV according to patient size and/or use of iterative reconstruction technique. CONTRAST:  5mL OMNIPAQUE IOHEXOL 300 MG/ML  SOLN COMPARISON:  Noncontrast CT on 11/18/2020 FINDINGS: Lower Chest: No acute findings. Hepatobiliary: No hepatic masses  identified. Gallbladder is unremarkable. Mild diffuse biliary ductal dilatation is seen, with common bile duct measuring 10 mm in diameter. Two tiny radiodensities are seen in the distal common bile duct, highly suspicious for choledocholithiasis. Pancreas:  No mass or  inflammatory changes. Spleen: Within normal limits in size and appearance. Adrenals/Urinary Tract: No masses identified. Tiny subcapsular left renal cyst noted. No evidence of ureteral calculi or hydronephrosis. Stomach/Bowel: No evidence of obstruction, inflammatory process or abnormal fluid collections. Normal appendix visualized. Vascular/Lymphatic: No pathologically enlarged lymph nodes. No acute vascular findings. Aortic atherosclerotic calcification noted. Reproductive: Prior hysterectomy noted. Adnexal regions are unremarkable in appearance. Other:  None. Musculoskeletal: No suspicious bone lesions identified. Lumbar spine degenerative changes and dextroscoliosis noted. IMPRESSION: Mild diffuse biliary ductal dilatation, with tiny radiodensities in distal common bile duct, highly suspicious for choledocholithiasis. Aortic Atherosclerosis (ICD10-I70.0). Electronically Signed   By: Marlaine Hind M.D.   On: 01/14/2022 16:04    Procedures Procedures    Medications Ordered in ED Medications  enoxaparin (LOVENOX) injection 40 mg (40 mg Subcutaneous Given 01/15/22 0555)  lactated ringers 1,000 mL with potassium chloride 20 mEq infusion ( Intravenous New Bag/Given 01/15/22 0855)  acetaminophen (TYLENOL) tablet 650 mg (has no administration in time range)    Or  acetaminophen (TYLENOL) suppository 650 mg (has no administration in time range)  prochlorperazine (COMPAZINE) injection 10 mg (has no administration in time range)  ipratropium-albuterol (DUONEB) 0.5-2.5 (3) MG/3ML nebulizer solution 3 mL (has no administration in time range)  sodium chloride 0.9 % bolus 1,000 mL (0 mLs Intravenous Stopped 01/14/22 1748)  iohexol (OMNIPAQUE) 300  MG/ML solution 100 mL (80 mLs Intravenous Contrast Given 01/14/22 1543)  acetaminophen (TYLENOL) tablet 1,000 mg (1,000 mg Oral Given 01/14/22 1655)  HYDROmorphone (DILAUDID) injection 0.5 mg (0.5 mg Intravenous Given 01/15/22 1443)    ED Course/ Medical Decision Making/ A&P Clinical Course as of 01/15/22 1010  Sun Jan 14, 2022  1723 Spoke to dr Paulita Fujita, Howie Ill, who recommends MRCP to evaluate for retained stones.  If no liver abnormalities and pt tolerating PO, potentially could be discharged home tonight or tomorrow without intervention.  Paging hospitalist [MT]  1752 Admitted to hospitalist [MT]    Clinical Course User Index [MT] Kamoni Depree, Carola Rhine, MD                           Medical Decision Making  This patient presents to the ED with concern for fatigue, lightheadedness. This involves an extensive number of treatment options, and is a complaint that carries with it a high risk of complications and morbidity.  The differential diagnosis includes infection vs dehydration vs anemia vs other  Patient appears mildly dehydrated, blood concentrated, does not drink water at home, mostly sweet tea.  Additional history obtained from patient's ex-husband Yvone Neu at bedside, who is a hospital pharmacist  External records from outside source obtained and reviewed including hospital summary course from March 2022  I ordered and personally interpreted labs.  The pertinent results include:  WBC elevated 13.8, covid/flu negative, Na 133, Cl 89, K 3.4, Cr 0.96, BUN 24.    Tbili and LFT's unremarkable.  Lipase wnl.  I ordered imaging studies including dg chest, CT abdomen pelvis I independently visualized and interpreted imaging which showed CBD diltation to 10 mm; likely 2 small retained stones; I agree with the radiologist interpretation  The patient was maintained on a cardiac monitor.  I personally viewed and interpreted the cardiac monitored which showed an underlying rhythm of: NSR  Per my  interpretation the patient's ECG shows NSR without acute ischemic findings  I ordered medication including IV fluids for suspected orthostatic hypotension   Test Considered:  - doubt meningitis;  LP not indicated at this tme   I requested consultation with the gastroenterology,  and discussed lab and imaging findings as well as pertinent plan - they recommend: see ed course  After the interventions noted above, I reevaluated the patient and found that they have: improved   Dispostion:  After consideration of the diagnostic results and the patients response to treatment, I feel that the patent would benefit from observation admission for likely MRCP, blood culture/bacteremia rule out.  At the time of admission I did not feel her exam or labs were consistent with sepsis, and antibiotics were not initiated.  She remained afebrile in the ED  Clinical Course as of 01/15/22 1010  Sun Jan 14, 2022  1723 Spoke to dr Paulita Fujita, Howie Ill, who recommends MRCP to evaluate for retained stones.  If no liver abnormalities and pt tolerating PO, potentially could be discharged home tonight or tomorrow without intervention.  Paging hospitalist [MT]  1752 Admitted to hospitalist [MT]    Clinical Course User Index [MT] Maryama Kuriakose, Carola Rhine, MD            Final Clinical Impression(s) / ED Diagnoses Final diagnoses:  Choledocholithiasis    Rx / DC Orders ED Discharge Orders     None         Wyvonnia Dusky, MD 01/15/22 1010

## 2022-01-15 ENCOUNTER — Inpatient Hospital Stay (HOSPITAL_COMMUNITY): Payer: Medicare HMO

## 2022-01-15 DIAGNOSIS — R636 Underweight: Secondary | ICD-10-CM | POA: Diagnosis present

## 2022-01-15 LAB — COMPREHENSIVE METABOLIC PANEL
ALT: 33 U/L (ref 0–44)
AST: 50 U/L — ABNORMAL HIGH (ref 15–41)
Albumin: 3.3 g/dL — ABNORMAL LOW (ref 3.5–5.0)
Alkaline Phosphatase: 41 U/L (ref 38–126)
Anion gap: 8 (ref 5–15)
BUN: 17 mg/dL (ref 8–23)
CO2: 29 mmol/L (ref 22–32)
Calcium: 8.2 mg/dL — ABNORMAL LOW (ref 8.9–10.3)
Chloride: 97 mmol/L — ABNORMAL LOW (ref 98–111)
Creatinine, Ser: 0.65 mg/dL (ref 0.44–1.00)
GFR, Estimated: >60 mL/min (ref >60)
Glucose, Bld: 94 mg/dL (ref 70–99)
Potassium: 3.1 mmol/L — ABNORMAL LOW (ref 3.5–5.1)
Sodium: 134 mmol/L — ABNORMAL LOW (ref 135–145)
Total Bilirubin: 0.6 mg/dL (ref 0.3–1.2)
Total Protein: 5.7 g/dL — ABNORMAL LOW (ref 6.5–8.1)

## 2022-01-15 LAB — TROPONIN I (HIGH SENSITIVITY)
Troponin I (High Sensitivity): 11 ng/L (ref <18)
Troponin I (High Sensitivity): 11 ng/L (ref <18)

## 2022-01-15 LAB — CBC
HCT: 37.9 % (ref 36.0–46.0)
Hemoglobin: 12.7 g/dL (ref 12.0–15.0)
MCH: 32.9 pg (ref 26.0–34.0)
MCHC: 33.5 g/dL (ref 30.0–36.0)
MCV: 98.2 fL (ref 80.0–100.0)
Platelets: 182 10*3/uL (ref 150–400)
RBC: 3.86 MIL/uL — ABNORMAL LOW (ref 3.87–5.11)
RDW: 13.7 % (ref 11.5–15.5)
WBC: 9 10*3/uL (ref 4.0–10.5)
nRBC: 0 % (ref 0.0–0.2)

## 2022-01-15 LAB — MAGNESIUM: Magnesium: 1.9 mg/dL (ref 1.7–2.4)

## 2022-01-15 MED ORDER — IPRATROPIUM-ALBUTEROL 0.5-2.5 (3) MG/3ML IN SOLN: 3.0000 mL | RESPIRATORY_TRACT | Status: DC | PRN

## 2022-01-15 MED ORDER — GADOBUTROL 1 MMOL/ML IV SOLN
5.0000 mL | Freq: Once | INTRAVENOUS | Status: AC | PRN
  Administered 2022-01-15: 5 mL via INTRAVENOUS

## 2022-01-15 MED ORDER — MELATONIN 3 MG PO TABS
3.0000 mg | ORAL_TABLET | Freq: Every day | ORAL | Status: AC
  Administered 2022-01-15 – 2022-01-16 (×2): 3 mg via ORAL
  Filled 2022-01-15 (×2): qty 1

## 2022-01-15 MED ORDER — PANTOPRAZOLE SODIUM 40 MG PO TBEC
40.0000 mg | DELAYED_RELEASE_TABLET | Freq: Two times a day (BID) | ORAL | Status: DC
  Administered 2022-01-15 – 2022-01-17 (×4): 40 mg via ORAL
  Filled 2022-01-15 (×4): qty 1

## 2022-01-15 MED ORDER — ENOXAPARIN SODIUM 30 MG/0.3ML IJ SOSY
30.0000 mg | PREFILLED_SYRINGE | INTRAMUSCULAR | Status: DC
  Administered 2022-01-16 – 2022-01-17 (×2): 30 mg via SUBCUTANEOUS
  Filled 2022-01-15: qty 0.3

## 2022-01-15 MED ORDER — MORPHINE SULFATE (PF) 2 MG/ML IV SOLN
2.0000 mg | INTRAVENOUS | Status: DC | PRN
  Administered 2022-01-15 – 2022-01-16 (×5): 2 mg via INTRAVENOUS
  Administered 2022-01-16: 4 mg via INTRAVENOUS
  Administered 2022-01-16: 2 mg via INTRAVENOUS
  Administered 2022-01-16: 4 mg via INTRAVENOUS
  Filled 2022-01-15: qty 2
  Filled 2022-01-15 (×7): qty 1
  Filled 2022-01-15: qty 2

## 2022-01-15 MED ORDER — IPRATROPIUM-ALBUTEROL 0.5-2.5 (3) MG/3ML IN SOLN
3.0000 mL | Freq: Four times a day (QID) | RESPIRATORY_TRACT | Status: DC
  Administered 2022-01-15: 3 mL via RESPIRATORY_TRACT
  Filled 2022-01-15: qty 3

## 2022-01-15 NOTE — Progress Notes (Signed)
°  Progress Note   Patient: Marie Lyons JOA:[id number] DOB: [birth year] DOA: 01/14/2022     1 DOS: the patient was seen and examined on 01/15/2022   Brief hospital course: No notes on file  Assessment and Plan * Biliary obstruction- (present on admission) Choledocholithiasis Elevated AST. Patient presents with complaints of abdominal pain.  Found to have choledocholithiasis with gallstones and biliary colic. AST elevated now trending down. Bilirubin normal.  No evidence of cholecystitis or pancreatitis. GI currently consulted.  Patient awaiting MRCP. General surgery will be consulted for lap chole. Currently on IV fluids, IV pain medication and IV antiemetic.    Prolonged QT interval- (present on admission) In the setting of hypokalemia. QTC 493. Will recheck.  Hyponatremia- (present on admission) Hypokalemia Sodium-potassium level low from poor p.o. intake as well as nausea and vomiting. Currently being replaced with IV.   COPD (chronic obstructive pulmonary disease) ([place])- (present on admission) Chronic COPD with bilateral expiratory wheezing. Does not use any medication at her baseline. Will add DuoNebs scheduled. Chest x-ray negative for any pneumonia.  HLD (hyperlipidemia)- (present on admission) On simvastatin. Currently on hold.  Essential hypertension- (present on admission) Blood pressure stable. Medication currently on hold.  Underweight- (present on admission) Body mass index is 17.54 kg/m.  Placing the patient at high risk for poor outcome. Monitor.     Subjective: No nausea or vomiting.  Reports ongoing abdominal pain.  No fever no chills.  Passing gas.  No shortness of breath.  No cough.  Objective  Vitals:   01/15/22 0915 01/15/22 1324 01/15/22 1523 01/15/22 1800  BP: 112/70 138/72  118/67  Pulse: 90 86  90  Resp: 16 18  18   Temp: 97.6 F (36.4 C) 98.4 F (36.9 C)  98 F (36.7 C)  TempSrc:      SpO2: 94% 99% 98% 94%  Height:         General: Appear in mild distress, no Rash; Oral Mucosa Clear, moist. no Abnormal Neck Mass Or lumps, Conjunctiva normal  Cardiovascular: S1 and S2 Present, no Murmur, Respiratory: good respiratory effort, Bilateral Air entry present and no Crackles, bilateral wheezes Abdomen: Bowel Sound present, Soft and left tenderness Extremities: no Pedal edema Neurology: alert and oriented to time, place, and person affect appropriate. no new focal deficit Gait not checked due to patient safety concerns   Data Reviewed:  I have Reviewed nursing notes, Vitals, and Lab results after pt's last encounter. Pertinent lab results CBC stable, CMP shows AST elevation which is also improving, I ordered labs including CT abdomen and pelvis with contrast shows mild diffuse biliary ductal dilation with tiny radiodensity in the distal CBD suspicious for choledocholithiasis, and I reviewed the last note from gastroenterology Dr. Jacinto Reap, and discussed pt's care plan and test results with them.   Family Communication: None at bedside  Disposition: Status is: Inpatient  Remains inpatient appropriate because: N.p.o., requiring further work-up for choledocholithiasis may require ER CP as well as lap chole.         Time spent: 45 minutes  Author: Berle Mull, MD 01/15/2022 7:08 PM  For on call review [email or web address].

## 2022-01-15 NOTE — Assessment & Plan Note (Signed)
On simvastatin. Currently on hold.

## 2022-01-15 NOTE — Assessment & Plan Note (Addendum)
Choledocholithiasis Elevated AST. Patient presents with complaints of abdominal pain.  Found to have choledocholithiasis. AST elevated now trending down. Bilirubin normal.  No evidence of cholecystitis or pancreatitis. GI consulted, underwent MRCP which is negative. Continues to have ongoing abdominal pain which is radiating to the left. General surgery consulted.  Does not feel the patient actually has biliary symptoms and therefore not recommending lap chole for now. GI has ordered HIDA scan. Patient reported worsening pain although for HIDA scan patient will be off of narcotic pain medications.

## 2022-01-15 NOTE — Assessment & Plan Note (Signed)
Blood pressure stable. Medication currently on hold.

## 2022-01-15 NOTE — Assessment & Plan Note (Addendum)
Body mass index is 17.54 kg/m.  Placing the patient at high risk for poor outcome. Monitor.

## 2022-01-15 NOTE — Assessment & Plan Note (Addendum)
In the setting of hypokalemia. QTC 493.

## 2022-01-15 NOTE — Consult Note (Signed)
Referring Provider:  ED Primary Care Physician:  Maury Dus, MD Primary Gastroenterologist: Sadie Haber primary  Reason for Consultation: Abnormal CT scan, abdominal pain  HPI: Marie Lyons is a 72 y.o. female with past medical history of COPD, abnormal heart rhythm was initially presented to [facility] ER with chief complaint of abdominal pain nausea, vomiting and decreased appetite along with weakness.  She was initially found to have orthostatic hypotension.  Further work-up revealed mildly elevated AST at 68 otherwise normal LFTs, normal lipase, mild elevated white count of 13.8 which has came down to normal this morning.  CT abdomen pelvis with contrast showed mild diffuse biliary ductal dilation with CBD measuring 10 mm with 2 tiny radiodensities in the distal common bile duct concerning for choledocholithiasis.  Patient seen and examined at bedside.  She started having epigastric pain around 6 days ago.  Denies any significant change in her back pain.  Her epigastric pain got worse on Wednesday followed by weakness and decreased appetite.  Her epigastric pain has improved now.  Denies any further vomiting.  Continues to have nausea.  Denies diarrhea or constipation.  Denies blood in the stool or black stool.   Last colonoscopy in 2018 likely [place] was normal with a fair prep and repeat colonoscopy was recommended in 5 years.  Looks like patient has been getting FIT for colon cancer screening by primary care physician.  Past Medical History:  Diagnosis Date   Cardiac arrhythmia    Chronic back pain    High cholesterol    Hypertension    Migraine    Scoliosis    Stroke [facility])     Past Surgical History:  Procedure Laterality Date   ABDOMINAL HYSTERECTOMY     BUNIONECTOMY     I & D EXTREMITY Left 03/09/2021   Procedure: IRRIGATION AND DEBRIDEMENT LEFT  WRIST AND LEFT HAND;  Surgeon: Iran Planas, MD;  Location: [place];  Service: Orthopedics;  Laterality: Left;   KNEE  ARTHROSCOPY WITH MEDIAL MENISECTOMY Right 03/12/2021   Procedure: KNEE ARTHROSCOPY WITH MEDIAL MENISECTOMY; PLACEMENT OF ANTIBIOTIC BEADS;  Surgeon: Meredith Pel, MD;  Location: [place];  Service: Orthopedics;  Laterality: Right;   ROTATOR CUFF REPAIR      Prior to Admission medications   Medication Sig Start Date End Date Taking? Authorizing Provider  acetaminophen (TYLENOL) 500 MG tablet Take 500 mg by mouth every 6 (six) hours as needed for headache (or pain).    Yes [provider]  ARTIFICIAL TEAR OP Place 1 drop into both eyes daily.   Yes [provider]  calcium elemental as carbonate (BARIATRIC TUMS ULTRA) 400 MG chewable tablet Chew 1,000 mg by mouth daily.   Yes [provider]  cholecalciferol (VITAMIN D) 1000 UNITS tablet Take 1,000 Units by mouth daily.    Yes [provider]  cyclobenzaprine (FLEXERIL) 10 MG tablet Take 10 mg by mouth at bedtime. 11/14/17  Yes [provider]  diphenhydrAMINE (BENADRYL) 25 MG tablet Take 1 tablet (25 mg total) by mouth every 6 (six) hours as needed. Patient taking differently: Take 25 mg by mouth every 6 (six) hours as needed for allergies. 12/16/20  Yes Aberman, Caroline C, PA-C  escitalopram (LEXAPRO) 20 MG tablet Take 20 mg by mouth daily. 11/14/17  Yes [provider]  estradiol (VIVELLE-DOT) 0.1 MG/24HR patch Place 1 patch onto the skin 2 (two) times a week.   Yes [provider]  fluticasone (FLONASE) 50 MCG/ACT nasal spray Place 2 sprays  into the nose daily. Patient taking differently: Place 2 sprays into the nose daily as needed for allergies. 07/25/13  Yes Roselee Culver, MD  HYDROcodone-acetaminophen (NORCO/VICODIN) 5-325 MG tablet Take 1 tablet by mouth every 6 (six) hours as needed for moderate pain or severe pain. 03/23/21  Yes [provider]  Melatonin 10 MG TABS Take 1 tablet by mouth at bedtime.   Yes [provider]  metoprolol succinate  (TOPROL-XL) 25 MG 24 hr tablet Take 25 mg by mouth at bedtime.   Yes [provider]  Multiple Vitamin (MULTIVITAMIN WITH MINERALS) TABS tablet Take 1 tablet by mouth daily. 03/11/21  Yes Mercy Riding, MD  ondansetron (ZOFRAN-ODT) 8 MG disintegrating tablet Take 8 mg by mouth every 8 (eight) hours as needed for nausea or vomiting. 01/12/22  Yes [provider]  predniSONE (DELTASONE) 10 MG tablet Take 10-40 mg by mouth as directed. Take 4 tablets (40 mg) for 5 Days, Take 3 tablets (30 mg) for 5 Days, Take 2 tablets (20 mg) for 5 Days, Take 1 tablet (10 mg) for 5 Days. 01/03/22  Yes [provider]  simvastatin (ZOCOR) 20 MG tablet Take 20 mg by mouth at bedtime.   Yes [provider]  valACYclovir (VALTREX) 1000 MG tablet Take 1,000 mg by mouth every 8 (eight) hours. 01/03/22  Yes [provider]  VENTOLIN HFA 108 (90 Base) MCG/ACT inhaler Inhale 2 puffs into the lungs every 4 (four) hours as needed for wheezing or shortness of breath. 10/17/17  Yes [provider]  vitamin C (ASCORBIC ACID) 250 MG tablet Take 250 mg by mouth daily.   Yes [provider]  EPINEPHrine 0.3 mg/0.3 mL IJ SOAJ injection Inject 0.3 mg into the muscle as needed for anaphylaxis. 12/16/20   Suzy Bouchard, PA-C  folic acid (FOLVITE) 1 MG tablet Take 1 tablet (1 mg total) by mouth daily. Patient not taking: Reported on 01/14/2022 03/11/21   Mercy Riding, MD    Scheduled Meds:  enoxaparin (LOVENOX) injection  40 mg Subcutaneous Q24H   Continuous Infusions:  lactated ringers with kcl 100 mL/hr at 01/15/22 0855   PRN Meds:.acetaminophen **OR** acetaminophen, ipratropium-albuterol, prochlorperazine  Allergies as of 01/14/2022 - Review Complete 01/14/2022  Allergen Reaction Noted   Ciprofloxacin Shortness Of Breath 09/09/2012   Keflex [cephalexin] Anaphylaxis 12/20/2020   Penicillins Anaphylaxis 07/30/2012   Aspirin Other (See Comments) 07/30/2012   Darvon  [propoxyphene hcl] Other (See Comments) 07/30/2012   Ibuprofen Other (See Comments) 07/30/2012   Imitrex [sumatriptan] Other (See Comments) 07/30/2012   Meperidine Other (See Comments)    Monosodium glutamate Other (See Comments) 09/02/2012   Nitrates, organic Other (See Comments) 09/02/2012   Nsaids Other (See Comments) 09/02/2012   Sulfa antibiotics Other (See Comments) 07/30/2012    Family History  Problem Relation Age of Onset   CAD Father    Stroke Neg Hx     Social History   Socioeconomic History   Marital status: Divorced    Spouse name: Not on file   Number of children: Not on file   Years of education: Not on file   Highest education level: Not on file  Occupational History   Not on file  Tobacco Use   Smoking status: Former    Packs/day: 0.50    Years: 25.00    Pack years: 12.50    Types: Cigarettes   Smokeless tobacco: Never  Vaping Use   Vaping Use: Every day   Substances:  Nicotine, Flavoring  Substance and Sexual Activity   Alcohol use: No   Drug use: No   Sexual activity: Not on file  Other Topics Concern   Not on file  Social History Narrative   Not on file   Social Determinants of Health   Financial Resource Strain: Not on file  Food Insecurity: Not on file  Transportation Needs: Not on file  Physical Activity: Not on file  Stress: Not on file  Social Connections: Not on file  Intimate Partner Violence: Not on file    Review of Systems: All negative except as stated above in HPI.  Physical Exam: Vital signs: Vitals:   01/15/22 0408 01/15/22 0915  BP: 103/65 112/70  Pulse: 94 90  Resp: 14 16  Temp: 98.2 F (36.8 C) 97.6 F (36.4 C)  SpO2: 92% 94%   Last BM Date: 01/14/22 General:   Alert,  Well-developed, well-nourished, pleasant and cooperative in NAD Lungs:  Clear throughout to auscultation.   No wheezes, crackles, or rhonchi. No acute distress. Heart:  Regular rate and rhythm; no murmurs, clicks, rubs,  or gallops. Abdomen:  Epigastric tenderness to palpation, abdomen is soft, nondistended, bowel sounds present.  No peritoneal signs Neuro : Alert and oriented x3 Psych : Mood and affect normal Rectal:  Deferred  GI:  Lab Results: Recent Labs    01/14/22 1343 01/15/22 0117  WBC 13.8* 9.0  HGB 15.1* 12.7  HCT 46.1* 37.9  PLT 228 182   BMET Recent Labs    01/14/22 1343 01/15/22 0117  NA 133* 134*  K 3.4* 3.1*  CL 89* 97*  CO2 33* 29  GLUCOSE 100* 94  BUN 24* 17  CREATININE 0.97 0.65  CALCIUM 9.9 8.2*   LFT Recent Labs    01/14/22 1515 01/15/22 0117  PROT 7.1 5.7*  ALBUMIN 4.1 3.3*  AST 68* 50*  ALT 38 33  ALKPHOS 51 41  BILITOT 0.4 0.6  BILIDIR <0.1  --   IBILI NOT CALCULATED  --    PT/INR No results for input(s): LABPROT, INR in the last 72 hours.   Studies/Results: DG Chest 2 View  Result Date: 01/14/2022 CLINICAL DATA:  Patient reports to the ER for lack of PO intake, dizziness and nausea. Patient reports this started x5 days ago. Friday night had emesis. EXAM: CHEST - 2 VIEW COMPARISON:  05/30/2021. FINDINGS: Cardiac silhouette is normal in size. No mediastinal or hilar masses or evidence of adenopathy. Lungs are hyperexpanded, but clear. No pleural effusion or pneumothorax. Stable thoracolumbar scoliosis. Skeletal structures are grossly intact. IMPRESSION: No acute cardiopulmonary disease. Electronically Signed   By: Lajean Manes M.D.   On: 01/14/2022 16:03   CT ABDOMEN PELVIS W CONTRAST  Result Date: 01/14/2022 CLINICAL DATA:  Acute abdominal pain and nausea. EXAM: CT ABDOMEN AND PELVIS WITH CONTRAST TECHNIQUE: Multidetector CT imaging of the abdomen and pelvis was performed using the standard protocol following bolus administration of intravenous contrast. RADIATION DOSE REDUCTION: This exam was performed according to the departmental dose-optimization program which includes automated exposure control, adjustment of the mA and/or kV according to patient size and/or use of  iterative reconstruction technique. CONTRAST:  58mL OMNIPAQUE IOHEXOL 300 MG/ML  SOLN COMPARISON:  Noncontrast CT on 11/18/2020 FINDINGS: Lower Chest: No acute findings. Hepatobiliary: No hepatic masses identified. Gallbladder is unremarkable. Mild diffuse biliary ductal dilatation is seen, with common bile duct measuring 10 mm in diameter. Two tiny radiodensities are seen in the distal common bile duct, highly suspicious for  choledocholithiasis. Pancreas:  No mass or inflammatory changes. Spleen: Within normal limits in size and appearance. Adrenals/Urinary Tract: No masses identified. Tiny subcapsular left renal cyst noted. No evidence of ureteral calculi or hydronephrosis. Stomach/Bowel: No evidence of obstruction, inflammatory process or abnormal fluid collections. Normal appendix visualized. Vascular/Lymphatic: No pathologically enlarged lymph nodes. No acute vascular findings. Aortic atherosclerotic calcification noted. Reproductive: Prior hysterectomy noted. Adnexal regions are unremarkable in appearance. Other:  None. Musculoskeletal: No suspicious bone lesions identified. Lumbar spine degenerative changes and dextroscoliosis noted. IMPRESSION: Mild diffuse biliary ductal dilatation, with tiny radiodensities in distal common bile duct, highly suspicious for choledocholithiasis. Aortic Atherosclerosis (ICD10-I70.0). Electronically Signed   By: Marlaine Hind M.D.   On: 01/14/2022 16:04    Impression/Plan: -Epigastric abdominal pain with nausea.  Minimally elevated AST otherwise normal T bili and alkaline phosphatase.  CT abdomen pelvis showed 2 tiny radiodensities in the distal common bile duct concerning for choledocholithiasis.  Recommendations -------------------------- -Follow MRI MRCP.  If MRCP confirms CBD stones, she will need ERCP but if MRCP negative, I would recommend surgery consult for evaluation for cholecystectomy. -Start pantoprazole -Okay to have full liquid diet after MRI.  Okay to  have ice chips. -GI will follow   LOS: 1 day   Otis Brace  MD, FACP 01/15/2022, 10:12 AM  Contact #  [phone]

## 2022-01-15 NOTE — Assessment & Plan Note (Addendum)
Chronic COPD with bilateral expiratory wheezing. Does not use any medication at her baseline. Duo nebs were added scheduled, RT modified to as needed. Will add Dulera and Spiriva. Chest x-ray negative for any pneumonia.

## 2022-01-15 NOTE — Assessment & Plan Note (Addendum)
Hypokalemia Sodium-potassium level low from poor p.o. intake as well as nausea and vomiting. Replaced.

## 2022-01-16 LAB — COMPREHENSIVE METABOLIC PANEL
ALT: 35 U/L (ref 0–44)
AST: 43 U/L — ABNORMAL HIGH (ref 15–41)
Albumin: 3.5 g/dL (ref 3.5–5.0)
Alkaline Phosphatase: 44 U/L (ref 38–126)
Anion gap: 6 (ref 5–15)
BUN: 8 mg/dL (ref 8–23)
CO2: 30 mmol/L (ref 22–32)
Calcium: 8.8 mg/dL — ABNORMAL LOW (ref 8.9–10.3)
Chloride: 99 mmol/L (ref 98–111)
Creatinine, Ser: 0.51 mg/dL (ref 0.44–1.00)
GFR, Estimated: >60 mL/min (ref >60)
Glucose, Bld: 104 mg/dL — ABNORMAL HIGH (ref 70–99)
Potassium: 4.3 mmol/L (ref 3.5–5.1)
Sodium: 135 mmol/L (ref 135–145)
Total Bilirubin: 0.7 mg/dL (ref 0.3–1.2)
Total Protein: 6.2 g/dL — ABNORMAL LOW (ref 6.5–8.1)

## 2022-01-16 LAB — GLUCOSE, CAPILLARY: Glucose-Capillary: 151 mg/dL — ABNORMAL HIGH (ref 70–99)

## 2022-01-16 LAB — MAGNESIUM: Magnesium: 1.8 mg/dL (ref 1.7–2.4)

## 2022-01-16 MED ORDER — GABAPENTIN 100 MG PO CAPS
100.0000 mg | ORAL_CAPSULE | Freq: Three times a day (TID) | ORAL | Status: DC
  Administered 2022-01-16 – 2022-01-17 (×3): 100 mg via ORAL
  Filled 2022-01-16 (×4): qty 1

## 2022-01-16 MED ORDER — HYDROMORPHONE HCL 1 MG/ML IJ SOLN
0.5000 mg | INTRAMUSCULAR | Status: DC | PRN
  Administered 2022-01-16 (×3): 0.5 mg via INTRAVENOUS
  Filled 2022-01-16 (×3): qty 0.5

## 2022-01-16 MED ORDER — MOMETASONE FURO-FORMOTEROL FUM 100-5 MCG/ACT IN AERO
2.0000 | INHALATION_SPRAY | Freq: Two times a day (BID) | RESPIRATORY_TRACT | Status: DC
  Administered 2022-01-16: 2 via RESPIRATORY_TRACT
  Filled 2022-01-16: qty 8.8

## 2022-01-16 MED ORDER — UMECLIDINIUM BROMIDE 62.5 MCG/ACT IN AEPB
1.0000 | INHALATION_SPRAY | Freq: Every day | RESPIRATORY_TRACT | Status: DC
  Filled 2022-01-16: qty 7

## 2022-01-16 NOTE — Progress Notes (Signed)
Transition of Care [facility]) Screening Note  Patient Details  Name: Marie Lyons Date of Birth: 1950-04-07  Transition of Care [facility]) CM/SW Contact:    Sherie Don, LCSW Phone Number: 01/16/2022, 10:31 AM  Transition of Care Department [facility]) has reviewed patient and no TOC needs have been identified at this time. We will continue to monitor patient advancement through interdisciplinary progression rounds. If new patient transition needs arise, please place a TOC consult.

## 2022-01-16 NOTE — Hospital Course (Signed)
Marie Lyons is a 72 y.o. female with medical history significant for COPD, HTN, cardiac arrhythmia, HLD who presented to [facility] ER from a local urgent care with complaint of abdominal pain, nausea vomiting, fatigue, decreased appetite and lightheadedness when she stood up. 1/15 admitted with abdominal pain and nausea and vomiting.  Had positive orthostasis. 1/16 GI consulted, MRCP negative for any biliary obstruction or stone. 1/17 General surgery consulted, recommend no lap chole.  And recommend EGD. HIDA scan ordered by GI

## 2022-01-16 NOTE — Consult Note (Addendum)
[facility] Progress Note  Marie Lyons 72 y.o. 29-Apr-1950  CC:  Epigastric pain with nausea   Subjective: Patient states she is still having significant pain. Difficult for her to sleep or get comfortable. Worse with movement. States it feels similar to her shingles pain. Tolerating clear liquids well. Denies nausea, vomiting.  ROS : Review of Systems  Gastrointestinal:  Positive for abdominal pain. Negative for blood in stool, constipation, diarrhea, heartburn, melena, nausea and vomiting.  Genitourinary:  Negative for dysuria and urgency.     Objective: Vital signs in last 24 hours: Vitals:   01/16/22 0555 01/16/22 0929  BP: (!) 144/90 131/76  Pulse: 95 81  Resp: 18 18  Temp: 98.3 F (36.8 C) 98.4 F (36.9 C)  SpO2: 93% 97%    Physical Exam:  General:  Alert, cooperative, no distress, appears stated age  Head:  Normocephalic, without obvious abnormality, atraumatic  Eyes:  Anicteric sclera, EOM's intact  Lungs:   Clear to auscultation bilaterally, respirations unlabored  Heart:  Regular rate and rhythm, S1, S2 normal  Abdomen:   Soft, tenderness in epigastrium. bowel sounds active all four quadrants,  no masses,     Lab Results: Recent Labs    01/14/22 2306 01/15/22 0117 01/16/22 0802  NA  --  134* 135  K  --  3.1* 4.3  CL  --  97* 99  CO2  --  29 30  GLUCOSE  --  94 104*  BUN  --  17 8  CREATININE  --  0.65 0.51  CALCIUM  --  8.2* 8.8*  MG 1.9  --  1.8   Recent Labs    01/15/22 0117 01/16/22 0802  AST 50* 43*  ALT 33 35  ALKPHOS 41 44  BILITOT 0.6 0.7  PROT 5.7* 6.2*  ALBUMIN 3.3* 3.5   Recent Labs    01/14/22 1343 01/15/22 0117  WBC 13.8* 9.0  HGB 15.1* 12.7  HCT 46.1* 37.9  MCV 98.1 98.2  PLT 228 182   No results for input(s): LABPROT, INR in the last 72 hours.    Assessment Epigastric pain with nausea - no leukocytosis - lipase 27 - AST 43/ ALT 35/ alk phos 44/ T bili 0.7 -  CT ab/pelvis with contrast 1/15: Mild  diffuse biliary ductal dilation -MRCP 1/16: Mild distention of the gallbladder without gallstones.  Minimally dilated CBD (0.8 cm) no evidence of calculus.   Plan: MRCP was negative for choledocholithiasis Appreciate general surgery consultation for further management Continue supportive care [facility] will follow   Garnette Scheuermann PA-C 01/16/2022, 9:54 AM  Contact #  [phone]

## 2022-01-16 NOTE — Consult Note (Signed)
Consult Note  Farley Ly 12/15/1950  [id number].    Requesting MD: Dr. Otis Brace Chief Complaint/Reason for Consult: abdominal pain  HPI:  Patient is a 72 year old female who presented to [facility] with complaints of abdominal pain, nausea, vomiting, poor appetite and fatigue. She reports these symptoms started 4-5 days prior to presentation. Pain is in epigastrium and LUQ. She had some nausea and vomiting, but states this stopped last week. Emesis has been non-bloody. She denied fever, chills, diarrhea, urinary symptoms. She was recently treated with a course of steroids and valtrex for shingles diagnosed by PCP 2 weeks ago.  She states her pain has persisted, but her nausea and vomiting have resolved.  She was able to eat some potato and eggs this weekend prior to admission.  However, her pain persisted.  Her pain was not exacerbated by food and did not make it any worse.  Her husband had her go to the ED as he felt she was likely dehydrated.  She had some orthostatic hypotension in the ED. PMH otherwise significant for Hx of CVA, COPD, Cardiac arrhythmia, Chronic back pain/Scoliosis, HLD, HTN, Migraine, and hx of peptic ulcer disease which is not clearly documented in chart. Allergies listed to NSAIDs and ASA for "aggravation of ulcer". Patient has several other allergies listed as well. Past abdominal surgery includes abdominal hysterectomy. Not on any blood thinners.  In the ED, her LFTs were essentially normal, but her WBC was slightly up at 13.8, but now normal at 9.  She did have a CT scan that revealed mild diffuse biliary ductal dilatation with tiny radiodensities in the distal CBD, suspicious for choledocholithiasis.  She then underwent an MRCP which was negative for CBD stones, cholelithiasis, or evidence of cholecystitis.  GI has been the patient and requested a surgical consult.   ROS:  Review of Systems  Constitutional:  Negative for chills and fever.  Gastrointestinal:   Positive for abdominal pain, nausea and vomiting.  N/V resolved. All other systems have been reviewed and are negative currently, except feeling weak.  Family History  Problem Relation Age of Onset   CAD Father    Stroke Neg Hx     Past Medical History:  Diagnosis Date   Cardiac arrhythmia    Chronic back pain    High cholesterol    Hypertension    Migraine    Scoliosis    Stroke [facility])     Past Surgical History:  Procedure Laterality Date   ABDOMINAL HYSTERECTOMY     BUNIONECTOMY     I & D EXTREMITY Left 03/09/2021   Procedure: IRRIGATION AND DEBRIDEMENT LEFT  WRIST AND LEFT HAND;  Surgeon: Iran Planas, MD;  Location: [facility];  Service: Orthopedics;  Laterality: Left;   KNEE ARTHROSCOPY WITH MEDIAL MENISECTOMY Right 03/12/2021   Procedure: KNEE ARTHROSCOPY WITH MEDIAL MENISECTOMY; PLACEMENT OF ANTIBIOTIC BEADS;  Surgeon: Meredith Pel, MD;  Location: [facility];  Service: Orthopedics;  Laterality: Right;   ROTATOR CUFF REPAIR      Social History:  reports that she has quit smoking. Her smoking use included cigarettes. She has a 12.50 pack-year smoking history. She has never used smokeless tobacco. She reports that she does not drink alcohol and does not use drugs.  Allergies:  Allergies  Allergen Reactions   Ciprofloxacin Shortness Of Breath    Denies this allergy on 09/23/16 (??)   Keflex [Cephalexin] Anaphylaxis   Penicillins Anaphylaxis    Has patient had a  PCN reaction causing immediate rash, facial/tongue/throat swelling, SOB or lightheadedness with hypotension: Yes Has patient had a PCN reaction causing severe rash involving mucus membranes or skin necrosis: Yes Has patient had a PCN reaction that required hospitalization Yes Has patient had a PCN reaction occurring within the last 10 years: No If all of the above answers are "NO", then may proceed with Cephalosporin use.    Aspirin Other (See Comments)    Aggravates ulcer   Darvon [Propoxyphene Hcl] Other (See  Comments)    Wheezing   Ibuprofen Other (See Comments)    Wheezing   Imitrex [Sumatriptan] Other (See Comments)    Contraindicated because pt had a stroke   Meperidine Other (See Comments)    Unknown reaction (per patient)   Monosodium Glutamate Other (See Comments)    Migraine    Nitrates, Organic Other (See Comments)    Migraine    Nsaids Other (See Comments)    Aggravates ulcer   Sulfa Antibiotics Other (See Comments)    Unknown reaction (per patient)    Medications Prior to Admission  Medication Sig Dispense Refill   acetaminophen (TYLENOL) 500 MG tablet Take 500 mg by mouth every 6 (six) hours as needed for headache (or pain).      ARTIFICIAL TEAR OP Place 1 drop into both eyes daily.     calcium elemental as carbonate (BARIATRIC TUMS ULTRA) 400 MG chewable tablet Chew 1,000 mg by mouth daily.     cholecalciferol (VITAMIN D) 1000 UNITS tablet Take 1,000 Units by mouth daily.      cyclobenzaprine (FLEXERIL) 10 MG tablet Take 10 mg by mouth at bedtime.  2   diphenhydrAMINE (BENADRYL) 25 MG tablet Take 1 tablet (25 mg total) by mouth every 6 (six) hours as needed. (Patient taking differently: Take 25 mg by mouth every 6 (six) hours as needed for allergies.) 30 tablet 0   escitalopram (LEXAPRO) 20 MG tablet Take 20 mg by mouth daily.  1   estradiol (VIVELLE-DOT) 0.1 MG/24HR patch Place 1 patch onto the skin 2 (two) times a week.     fluticasone (FLONASE) 50 MCG/ACT nasal spray Place 2 sprays into the nose daily. (Patient taking differently: Place 2 sprays into the nose daily as needed for allergies.) 16 g 12   HYDROcodone-acetaminophen (NORCO/VICODIN) 5-325 MG tablet Take 1 tablet by mouth every 6 (six) hours as needed for moderate pain or severe pain.     Melatonin 10 MG TABS Take 1 tablet by mouth at bedtime.     metoprolol succinate (TOPROL-XL) 25 MG 24 hr tablet Take 25 mg by mouth at bedtime.     Multiple Vitamin (MULTIVITAMIN WITH MINERALS) TABS tablet Take 1 tablet by mouth  daily. 90 tablet 1   ondansetron (ZOFRAN-ODT) 8 MG disintegrating tablet Take 8 mg by mouth every 8 (eight) hours as needed for nausea or vomiting.     predniSONE (DELTASONE) 10 MG tablet Take 10-40 mg by mouth as directed. Take 4 tablets (40 mg) for 5 Days, Take 3 tablets (30 mg) for 5 Days, Take 2 tablets (20 mg) for 5 Days, Take 1 tablet (10 mg) for 5 Days.     simvastatin (ZOCOR) 20 MG tablet Take 20 mg by mouth at bedtime.     valACYclovir (VALTREX) 1000 MG tablet Take 1,000 mg by mouth every 8 (eight) hours.     VENTOLIN HFA 108 (90 Base) MCG/ACT inhaler Inhale 2 puffs into the lungs every 4 (four) hours as needed for wheezing  or shortness of breath.  5   vitamin C (ASCORBIC ACID) 250 MG tablet Take 250 mg by mouth daily.     EPINEPHrine 0.3 mg/0.3 mL IJ SOAJ injection Inject 0.3 mg into the muscle as needed for anaphylaxis. 1 each 0   folic acid (FOLVITE) 1 MG tablet Take 1 tablet (1 mg total) by mouth daily. (Patient not taking: Reported on 01/14/2022) 90 tablet 1    Blood pressure 131/76, pulse 81, temperature 98.4 F (36.9 C), resp. rate 18, height 5\' 3"  (1.6 m), SpO2 97 %. Physical Exam:  General: pleasant, but somewhat frail appearing female who is laying in bed in NAD HEENT: head is normocephalic, atraumatic.  Sclera are anicteric. Pupils equal and round.  Ears and nose without any masses or lesions.  Mouth is pink and dry Heart: regular, rate, and rhythm.  Normal s1,s2. No obvious murmurs, gallops, or rubs noted.  Palpable radial and pedal pulses bilaterally Lungs: some expiratory wheeze noted, mostly on left side.  Respiratory effort nonlabored Abd: soft, NT in RUQ, no Murphy's sign.  Tender in LUQ, specifically over her prior shingles rash.  This rash is scabbed over and extend from LUQ towards her left flank and back., ND, +BS, no masses, hernias, or organomegaly MS: all 4 extremities are symmetrical with no cyanosis, clubbing, or edema. Skin: warm and dry with no masses, lesions,  or rashes Neuro: Cranial nerves 2-12 grossly intact, sensation is normal throughout Psych: A&Ox3 with an appropriate affect.   Results for orders placed or performed during the hospital encounter of 01/14/22 (from the past 48 hour(s))  Basic metabolic panel     Status: Abnormal   Collection Time: 01/14/22  1:43 PM  Result Value Ref Range   Sodium 133 (L) 135 - 145 mmol/L   Potassium 3.4 (L) 3.5 - 5.1 mmol/L   Chloride 89 (L) 98 - 111 mmol/L   CO2 33 (H) 22 - 32 mmol/L   Glucose, Bld 100 (H) 70 - 99 mg/dL    Comment: Glucose reference range applies only to samples taken after fasting for at least 8 hours.   BUN 24 (H) 8 - 23 mg/dL   Creatinine, Ser 0.97 0.44 - 1.00 mg/dL   Calcium 9.9 8.9 - 10.3 mg/dL   GFR, Estimated >60 >60 mL/min    Comment: (NOTE) Calculated using the CKD-EPI Creatinine Equation (2021)    Anion gap 11 5 - 15    Comment: Performed at [facility], [address] [place], [place] [address]  CBC     Status: Abnormal   Collection Time: 01/14/22  1:43 PM  Result Value Ref Range   WBC 13.8 (H) 4.0 - 10.5 K/uL   RBC 4.70 3.87 - 5.11 MIL/uL   Hemoglobin 15.1 (H) 12.0 - 15.0 g/dL   HCT 46.1 (H) 36.0 - 46.0 %   MCV 98.1 80.0 - 100.0 fL   MCH 32.1 26.0 - 34.0 pg   MCHC 32.8 30.0 - 36.0 g/dL   RDW 13.6 11.5 - 15.5 %   Platelets 228 150 - 400 K/uL   nRBC 0.0 0.0 - 0.2 %    Comment: Performed at [facility], [address], [place], [place] [address]  Resp Panel by RT-PCR (Flu A&B, Covid) Nasopharyngeal Swab     Status: None   Collection Time: 01/14/22  1:43 PM   Specimen: Nasopharyngeal Swab; Nasopharyngeal(NP) swabs in vial transport medium  Result Value Ref Range   SARS Coronavirus 2 by RT PCR  NEGATIVE NEGATIVE    Comment: (NOTE) SARS-CoV-2 target nucleic acids are NOT DETECTED.  The SARS-CoV-2 RNA is generally detectable in upper respiratory specimens during the acute phase of infection. The lowest concentration of  SARS-CoV-2 viral copies this assay can detect is 138 copies/mL. A negative result does not preclude SARS-Cov-2 infection and should not be used as the sole basis for treatment or other patient management decisions. A negative result may occur with  improper specimen collection/handling, submission of specimen other than nasopharyngeal swab, presence of viral mutation(s) within the areas targeted by this assay, and inadequate number of viral copies(<138 copies/mL). A negative result must be combined with clinical observations, patient history, and epidemiological information. The expected result is Negative.  Fact Sheet for Patients:  EntrepreneurPulse.com.au  Fact Sheet for Healthcare Providers:  IncredibleEmployment.be  This test is no t yet approved or cleared by the [place] FDA and  has been authorized for detection and/or diagnosis of SARS-CoV-2 by FDA under an Emergency Use Authorization (EUA). This EUA will remain  in effect (meaning this test can be used) for the duration of the COVID-19 declaration under Section 564(b)(1) of the Act, 21 U.S.C.section 360bbb-3(b)(1), unless the authorization is terminated  or revoked sooner.       Influenza A by PCR NEGATIVE NEGATIVE   Influenza B by PCR NEGATIVE NEGATIVE    Comment: (NOTE) The Xpert Xpress SARS-CoV-2/FLU/RSV plus assay is intended as an aid in the diagnosis of influenza from Nasopharyngeal swab specimens and should not be used as a sole basis for treatment. Nasal washings and aspirates are unacceptable for Xpert Xpress SARS-CoV-2/FLU/RSV testing.  Fact Sheet for Patients: EntrepreneurPulse.com.au  Fact Sheet for Healthcare Providers: IncredibleEmployment.be  This test is not yet approved or cleared by the [place] FDA and has been authorized for detection and/or diagnosis of SARS-CoV-2 by FDA under an Emergency Use Authorization (EUA).  This EUA will remain in effect (meaning this test can be used) for the duration of the COVID-19 declaration under Section 564(b)(1) of the Act, 21 U.S.C. section 360bbb-3(b)(1), unless the authorization is terminated or revoked.  Performed at [facility], [address], [place], [place] [address]   Hepatic function panel     Status: Abnormal   Collection Time: 01/14/22  3:15 PM  Result Value Ref Range   Total Protein 7.1 6.5 - 8.1 g/dL   Albumin 4.1 3.5 - 5.0 g/dL   AST 68 (H) 15 - 41 U/L   ALT 38 0 - 44 U/L   Alkaline Phosphatase 51 38 - 126 U/L   Total Bilirubin 0.4 0.3 - 1.2 mg/dL   Bilirubin, Direct <0.1 0.0 - 0.2 mg/dL   Indirect Bilirubin NOT CALCULATED 0.3 - 0.9 mg/dL    Comment: Performed at [facility], [place], [place] [address]  Lipase, blood     Status: None   Collection Time: 01/14/22  3:15 PM  Result Value Ref Range   Lipase 27 11 - 51 U/L    Comment: Performed at [facility], [place], [place] [address]  Troponin I (High Sensitivity)     Status: Abnormal   Collection Time: 01/14/22  3:15 PM  Result Value Ref Range   Troponin I (High Sensitivity) 19 (H) <18 ng/L    Comment: (NOTE) Elevated high sensitivity troponin I (hsTnI) values and significant  changes across serial measurements may suggest ACS but many other  chronic and acute conditions are known to elevate hsTnI results.  Refer to the "Links" section for chest pain algorithms and additional  guidance. Performed at [facility], [address], [place], [place] [address]   Culture, blood (single)     Status: None (Preliminary result)   Collection Time: 01/14/22  3:30 PM   Specimen: BLOOD  Result Value Ref Range   Specimen Description      BLOOD LEFT ANTECUBITAL Performed at [facility] [address] [place], [place] [address]    Special Requests      BOTTLES DRAWN AEROBIC AND  ANAEROBIC Blood Culture adequate volume Performed at [facility], [address] [place], [place] [address]    Culture      NO GROWTH 2 DAYS Performed at [facility], [place] [address] [place], [place] [address]    Report Status PENDING   Urinalysis, Routine w reflex microscopic Urine, Clean Catch     Status: Abnormal   Collection Time: 01/14/22  5:48 PM  Result Value Ref Range   Color, Urine YELLOW YELLOW   APPearance CLEAR CLEAR   Specific Gravity, Urine 1.038 (H) 1.005 - 1.030   pH 6.5 5.0 - 8.0   Glucose, UA NEGATIVE NEGATIVE mg/dL   Hgb urine dipstick NEGATIVE NEGATIVE   Bilirubin Urine NEGATIVE NEGATIVE   Ketones, ur NEGATIVE NEGATIVE mg/dL   Protein, ur TRACE (A) NEGATIVE mg/dL   Nitrite NEGATIVE NEGATIVE   Leukocytes,Ua NEGATIVE NEGATIVE    Comment: Performed at [facility], [place], [place] [address]  Magnesium     Status: None   Collection Time: 01/14/22 11:06 PM  Result Value Ref Range   Magnesium 1.9 1.7 - 2.4 mg/dL    Comment: Performed at [facility], [place] [address] [place], [place] [address]  Troponin I (High Sensitivity)     Status: None   Collection Time: 01/14/22 11:06 PM  Result Value Ref Range   Troponin I (High Sensitivity) 11 <18 ng/L    Comment: (NOTE) Elevated high sensitivity troponin I (hsTnI) values and significant  changes across serial measurements may suggest ACS but many other  chronic and acute conditions are known to elevate hsTnI results.  Refer to the "Links" section for chest pain algorithms and additional  guidance. Performed at [facility], [place] [address] [place], [place] [address]   Comprehensive metabolic panel     Status: Abnormal   Collection Time: 01/15/22  1:17 AM  Result Value Ref Range   Sodium 134 (L) 135 - 145 mmol/L   Potassium 3.1 (L) 3.5 - 5.1 mmol/L   Chloride 97 (L) 98 - 111 mmol/L   CO2 29 22 - 32 mmol/L    Glucose, Bld 94 70 - 99 mg/dL    Comment: Glucose reference range applies only to samples taken after fasting for at least 8 hours.   BUN 17 8 - 23 mg/dL   Creatinine, Ser 0.65 0.44 - 1.00 mg/dL   Calcium 8.2 (L) 8.9 - 10.3 mg/dL   Total Protein 5.7 (L) 6.5 - 8.1 g/dL   Albumin 3.3 (L) 3.5 - 5.0 g/dL   AST 50 (H) 15 - 41 U/L   ALT 33 0 - 44 U/L   Alkaline Phosphatase 41 38 - 126 U/L   Total Bilirubin 0.6 0.3 - 1.2 mg/dL   GFR, Estimated >60 >60 mL/min    Comment: (NOTE) Calculated using the CKD-EPI Creatinine Equation (2021)    Anion gap 8 5 - 15  Comment: Performed at [facility], [place] [address], [place], [place] [address]  CBC     Status: Abnormal   Collection Time: 01/15/22  1:17 AM  Result Value Ref Range   WBC 9.0 4.0 - 10.5 K/uL   RBC 3.86 (L) 3.87 - 5.11 MIL/uL   Hemoglobin 12.7 12.0 - 15.0 g/dL   HCT 37.9 36.0 - 46.0 %   MCV 98.2 80.0 - 100.0 fL   MCH 32.9 26.0 - 34.0 pg   MCHC 33.5 30.0 - 36.0 g/dL   RDW 13.7 11.5 - 15.5 %   Platelets 182 150 - 400 K/uL   nRBC 0.0 0.0 - 0.2 %    Comment: Performed at [facility], [place] [address] [place], [place] [address]  Troponin I (High Sensitivity)     Status: None   Collection Time: 01/15/22  1:17 AM  Result Value Ref Range   Troponin I (High Sensitivity) 11 <18 ng/L    Comment: (NOTE) Elevated high sensitivity troponin I (hsTnI) values and significant  changes across serial measurements may suggest ACS but many other  chronic and acute conditions are known to elevate hsTnI results.  Refer to the "Links" section for chest pain algorithms and additional  guidance. Performed at [facility], [place] [address] [place], [place] [address]   Comprehensive metabolic panel     Status: Abnormal   Collection Time: 01/16/22  8:02 AM  Result Value Ref Range   Sodium 135 135 - 145 mmol/L   Potassium 4.3 3.5 - 5.1 mmol/L    Comment: DELTA CHECK NOTED NO VISIBLE HEMOLYSIS     Chloride 99 98 - 111 mmol/L   CO2 30 22 - 32 mmol/L   Glucose, Bld 104 (H) 70 - 99 mg/dL    Comment: Glucose reference range applies only to samples taken after fasting for at least 8 hours.   BUN 8 8 - 23 mg/dL   Creatinine, Ser 0.51 0.44 - 1.00 mg/dL   Calcium 8.8 (L) 8.9 - 10.3 mg/dL   Total Protein 6.2 (L) 6.5 - 8.1 g/dL   Albumin 3.5 3.5 - 5.0 g/dL   AST 43 (H) 15 - 41 U/L   ALT 35 0 - 44 U/L   Alkaline Phosphatase 44 38 - 126 U/L   Total Bilirubin 0.7 0.3 - 1.2 mg/dL   GFR, Estimated >60 >60 mL/min    Comment: (NOTE) Calculated using the CKD-EPI Creatinine Equation (2021)    Anion gap 6 5 - 15    Comment: Performed at [facility], [place] [address], [place], [place] [address]  Magnesium     Status: None   Collection Time: 01/16/22  8:02 AM  Result Value Ref Range   Magnesium 1.8 1.7 - 2.4 mg/dL    Comment: Performed at [facility], [place] [address], [place], [place] [address]   DG Chest 2 View  Result Date: 01/14/2022 CLINICAL DATA:  Patient reports to the ER for lack of PO intake, dizziness and nausea. Patient reports this started x5 days ago. Friday night had emesis. EXAM: CHEST - 2 VIEW COMPARISON:  05/30/2021. FINDINGS: Cardiac silhouette is normal in size. No mediastinal or hilar masses or evidence of adenopathy. Lungs are hyperexpanded, but clear. No pleural effusion or pneumothorax. Stable thoracolumbar scoliosis. Skeletal structures are grossly intact. IMPRESSION: No acute cardiopulmonary disease. Electronically Signed   By: Lajean Manes M.D.   On: 01/14/2022 16:03   CT ABDOMEN PELVIS W  CONTRAST  Result Date: 01/14/2022 CLINICAL DATA:  Acute abdominal pain and nausea. EXAM: CT ABDOMEN AND PELVIS WITH CONTRAST TECHNIQUE: Multidetector CT imaging of the abdomen and pelvis was performed using the standard protocol following bolus administration of intravenous contrast. RADIATION DOSE REDUCTION: This exam was performed according to the  departmental dose-optimization program which includes automated exposure control, adjustment of the mA and/or kV according to patient size and/or use of iterative reconstruction technique. CONTRAST:  46mL OMNIPAQUE IOHEXOL 300 MG/ML  SOLN COMPARISON:  Noncontrast CT on 11/18/2020 FINDINGS: Lower Chest: No acute findings. Hepatobiliary: No hepatic masses identified. Gallbladder is unremarkable. Mild diffuse biliary ductal dilatation is seen, with common bile duct measuring 10 mm in diameter. Two tiny radiodensities are seen in the distal common bile duct, highly suspicious for choledocholithiasis. Pancreas:  No mass or inflammatory changes. Spleen: Within normal limits in size and appearance. Adrenals/Urinary Tract: No masses identified. Tiny subcapsular left renal cyst noted. No evidence of ureteral calculi or hydronephrosis. Stomach/Bowel: No evidence of obstruction, inflammatory process or abnormal fluid collections. Normal appendix visualized. Vascular/Lymphatic: No pathologically enlarged lymph nodes. No acute vascular findings. Aortic atherosclerotic calcification noted. Reproductive: Prior hysterectomy noted. Adnexal regions are unremarkable in appearance. Other:  None. Musculoskeletal: No suspicious bone lesions identified. Lumbar spine degenerative changes and dextroscoliosis noted. IMPRESSION: Mild diffuse biliary ductal dilatation, with tiny radiodensities in distal common bile duct, highly suspicious for choledocholithiasis. Aortic Atherosclerosis (ICD10-I70.0). Electronically Signed   By: Marlaine Hind M.D.   On: 01/14/2022 16:04   MR 3D Recon At Scanner  Result Date: 01/15/2022 CLINICAL DATA:  Biliary ductal dilatation by prior CT, evaluate for choledocholithiasis EXAM: MRI ABDOMEN WITH CONTRAST (WITH MRCP) TECHNIQUE: Multiplanar multisequence MR imaging of the abdomen was performed following the administration of intravenous contrast. Heavily T2-weighted images of the biliary and pancreatic ducts  were obtained, and three-dimensional MRCP images were rendered by post processing. CONTRAST:  65mL GADAVIST GADOBUTROL 1 MMOL/ML IV SOLN COMPARISON:  CT abdomen pelvis, 01/14/2022 FINDINGS: Lower chest: No acute findings. Hepatobiliary: No mass or other parenchymal abnormality identified. No gallstones. Mild distention of the gallbladder without gallbladder wall thickening as well as intrahepatic biliary ductal dilatation. The common bile duct minimally dilated, measuring up to 0.8 cm centrally, and tapers smoothly to the ampulla (series 5, image 33). Pancreas: No mass, inflammatory changes, or other parenchymal abnormality identified.No pancreatic ductal dilatation. Spleen:  Within normal limits in size and appearance. Adrenals/Urinary Tract: Normal adrenal glands. No renal masses or suspicious contrast enhancement identified. No evidence of hydronephrosis. Stomach/Bowel: Visualized portions within the abdomen are unremarkable. Vascular/Lymphatic: No pathologically enlarged lymph nodes identified. No abdominal aortic aneurysm demonstrated. Other:  None. Musculoskeletal: No suspicious osseous lesions identified. Dextroscoliosis of the lumbar spine. IMPRESSION: Mild distention of the gallbladder without gallstones or gallbladder wall thickening, as well as intrahepatic biliary ductal dilatation. The common bile duct is minimally dilated, measuring up to 0.8 cm centrally, and tapers smoothly to the ampulla without evidence of calculus at the ampulla as queried by prior CT. Consider ERCP if there is clinical evidence of cholestasis. Electronically Signed   By: Delanna Ahmadi M.D.   On: 01/15/2022 12:34      Assessment/Plan Abdominal pain with nausea and vomiting (N/V resolved) - CT AP 1/15 with diffuse biliary ductal dilation and possible choledocholithiasis but no cholelithiasis noted and gallbladder was unremarkable - MRCP 1/16 with Mild distention of the gallbladder without gallstones or gallbladder wall  thickening, as well as intrahepatic biliary ductal dilatation. The common bile  duct is minimally dilated, measuring up to 0.8 cm centrally, and tapers smoothly to the ampulla without evidence of calculus at the ampulla as queried by prior CT. Consider ERCP if there is clinical evidence of cholestasis. - AST was mildly elevated at 68 on admission and has been trending down, ALT and Tbili have been unremarkable, lipase normal on admission  - WBC was 13 on admit and is now normalized, afebrile  - without gallstones identified on MRCP, this patient would not likely benefit from cholecystectomy since no signs of acalculous cholecystitis either  -her story is also not c/w biliary disease.  Her pain is on her left side, her pain is not related to her food, and she currently has no further gallstones noted so she does not have biliary colic.   -there is no indication for a lap chole as she has no further pathology for biliary disease. - patient has allergies listed to ASA and NSAIDs because they "aggravate ulcer" - would consider peptic ulcer disease as possible etiology of symptoms given recent course of steroids as well  - would recommend EGD if symptoms continue to rule out other causes or gabapentin/lyrica for post-herpetic neuralgia given her pain is on the left side over this rash. -patient may have a diet from our standpoint -No indications for surgical intervention.  Would defer back to GI and primary for further work up and evaluation.  We will sign off.  FEN: ice chips/sips, no IVF VTE: LMWH ID: no current abx  Recent shingles infection 2 weeks ago  Hx of CVA COPD Cardiac arrhythmia Chronic back pain/Scoliosis HLD HTN Migraine    I reviewed ED provider notes, Consultant   notes, hospitalist notes, last 24 h vitals and pain scores, last 48 h intake and output, last 24 h labs and trends, and last 24 h imaging results.  This care required high  level of medical decision making.   Henreitta Cea, [facility] 01/16/2022, 10:23 AM Please see Amion for pager number during day hours 7:00am-4:30pm

## 2022-01-16 NOTE — Progress Notes (Addendum)
Progress Note   Patient: Marie Lyons TLX:[id number] DOB: Apr 30, 1950 DOA: 01/14/2022     2 DOS: the patient was seen and examined on 01/16/2022   Brief hospital course: Callaway Hardigree is a 72 y.o. female with medical history significant for COPD, HTN, cardiac arrhythmia, HLD who presented to [facility] ER from a local urgent care with complaint of abdominal pain, nausea vomiting, fatigue, decreased appetite and lightheadedness when she stood up. 1/15 admitted with abdominal pain and nausea and vomiting.  Had positive orthostasis. 1/16 GI consulted, MRCP negative for any biliary obstruction or stone. 1/17 General surgery consulted, recommend no lap chole.  And recommend EGD. HIDA scan ordered by GI  Assessment and Plan * Biliary obstruction- (present on admission) Choledocholithiasis Elevated AST. Patient presents with complaints of abdominal pain.  Found to have choledocholithiasis. AST elevated now trending down. Bilirubin normal.  No evidence of cholecystitis or pancreatitis. GI consulted, underwent MRCP which is negative. Continues to have ongoing abdominal pain which is radiating to the left. General surgery consulted.  Does not feel the patient actually has biliary symptoms and therefore not recommending lap chole for now. GI has ordered HIDA scan. Patient reported worsening pain although for HIDA scan patient will be off of narcotic pain medications.   Prolonged QT interval- (present on admission) In the setting of hypokalemia. QTC 493.  Hyponatremia- (present on admission) Hypokalemia Sodium-potassium level low from poor p.o. intake as well as nausea and vomiting. Replaced.   COPD (chronic obstructive pulmonary disease) ([place])- (present on admission) Chronic COPD with bilateral expiratory wheezing. Does not use any medication at her baseline. Duo nebs were added scheduled, RT modified to as needed. Will add Dulera and Spiriva. Chest x-ray negative for any  pneumonia.  HLD (hyperlipidemia)- (present on admission) On simvastatin. Currently on hold.  Essential hypertension- (present on admission) Blood pressure stable. Medication currently on hold.  Underweight- (present on admission) Body mass index is 17.54 kg/m.  Placing the patient at high risk for poor outcome. Monitor.     Subjective: No nausea no vomiting.  Passing gas.  Continues to report abdominal pain uncontrolled.  Pain reported as radiating to the left.  Wants to eat food and does not want to be n.p.o.  Objective Vitals:   01/16/22 0512 01/16/22 0555 01/16/22 0929 01/16/22 1342  BP:  (!) 144/90 131/76 (!) 153/75  Pulse:  95 81 89  Resp:  18 18 16   Temp:  98.3 F (36.8 C) 98.4 F (36.9 C) 98.1 F (36.7 C)  TempSrc:  Oral  Oral  SpO2:  93% 97% 99%  Height: 5\' 3"  (1.6 m)       General: Appear in moderate distress, no Rash; Oral Mucosa Clear, moist. no Abnormal Neck Mass Or lumps, Conjunctiva normal  Cardiovascular: S1 and S2 Present, no Murmur, Respiratory: good respiratory effort, Bilateral Air entry present and NO  Crackles, BILATERAL wheezes Abdomen: Bowel Sound present, Soft and epigastric and left upper quadrant tenderness Extremities: no Pedal edema Neurology: alert and oriented to time, place, and person affect anxious. no new focal deficit Gait not checked due to patient safety concerns    Data Reviewed:  I have Reviewed nursing notes, Vitals, and Lab results after pt's last encounter. Pertinent lab results CMP shows normal bilirubin and improving AST, MRCP negative, I ordered labs including CMP and CBC, and I reviewed the last note from general surgery and GI, and discussed pt's care plan and test results with them.   Family Communication: None at bedside  Disposition: Status is: Inpatient  Remains inpatient appropriate because: Ongoing abdominal pain requiring further work-up     Author: Berle Mull, MD 01/16/2022 7:07 PM  For on call  review [email or web address].

## 2022-01-17 ENCOUNTER — Inpatient Hospital Stay (HOSPITAL_COMMUNITY): Payer: Medicare HMO

## 2022-01-17 DIAGNOSIS — K831 Obstruction of bile duct: Secondary | ICD-10-CM

## 2022-01-17 LAB — COMPREHENSIVE METABOLIC PANEL
ALT: 33 U/L (ref 0–44)
AST: 38 U/L (ref 15–41)
Albumin: 3 g/dL — ABNORMAL LOW (ref 3.5–5.0)
Alkaline Phosphatase: 43 U/L (ref 38–126)
Anion gap: 4 — ABNORMAL LOW (ref 5–15)
BUN: 5 mg/dL — ABNORMAL LOW (ref 8–23)
CO2: 30 mmol/L (ref 22–32)
Calcium: 8.5 mg/dL — ABNORMAL LOW (ref 8.9–10.3)
Chloride: 99 mmol/L (ref 98–111)
Creatinine, Ser: 0.59 mg/dL (ref 0.44–1.00)
GFR, Estimated: >60 mL/min (ref >60)
Glucose, Bld: 96 mg/dL (ref 70–99)
Potassium: 4.2 mmol/L (ref 3.5–5.1)
Sodium: 133 mmol/L — ABNORMAL LOW (ref 135–145)
Total Bilirubin: 0.5 mg/dL (ref 0.3–1.2)
Total Protein: 5.4 g/dL — ABNORMAL LOW (ref 6.5–8.1)

## 2022-01-17 LAB — CBC WITH DIFFERENTIAL/PLATELET
Abs Immature Granulocytes: 0.02 10*3/uL (ref 0.00–0.07)
Basophils Absolute: 0.1 10*3/uL (ref 0.0–0.1)
Basophils Relative: 1 %
Eosinophils Absolute: 0.4 10*3/uL (ref 0.0–0.5)
Eosinophils Relative: 5 %
HCT: 36.6 % (ref 36.0–46.0)
Hemoglobin: 12.2 g/dL (ref 12.0–15.0)
Immature Granulocytes: 0 %
Lymphocytes Relative: 21 %
Lymphs Abs: 1.6 10*3/uL (ref 0.7–4.0)
MCH: 33.5 pg (ref 26.0–34.0)
MCHC: 33.3 g/dL (ref 30.0–36.0)
MCV: 100.5 fL — ABNORMAL HIGH (ref 80.0–100.0)
Monocytes Absolute: 0.7 10*3/uL (ref 0.1–1.0)
Monocytes Relative: 9 %
Neutro Abs: 5.1 10*3/uL (ref 1.7–7.7)
Neutrophils Relative %: 64 %
Platelets: 152 10*3/uL (ref 150–400)
RBC: 3.64 MIL/uL — ABNORMAL LOW (ref 3.87–5.11)
RDW: 13.4 % (ref 11.5–15.5)
WBC: 7.8 10*3/uL (ref 4.0–10.5)
nRBC: 0 % (ref 0.0–0.2)

## 2022-01-17 LAB — GLUCOSE, CAPILLARY: Glucose-Capillary: 136 mg/dL — ABNORMAL HIGH (ref 70–99)

## 2022-01-17 LAB — MAGNESIUM: Magnesium: 1.6 mg/dL — ABNORMAL LOW (ref 1.7–2.4)

## 2022-01-17 MED ORDER — TECHNETIUM TC 99M MEBROFENIN IV KIT
5.2000 | PACK | Freq: Once | INTRAVENOUS | Status: AC | PRN
  Administered 2022-01-17: 5.2 via INTRAVENOUS

## 2022-01-17 MED ORDER — MAGNESIUM SULFATE 2 GM/50ML IV SOLN
2.0000 g | Freq: Once | INTRAVENOUS | Status: AC
  Administered 2022-01-17: 2 g via INTRAVENOUS
  Filled 2022-01-17: qty 50

## 2022-01-17 MED ORDER — ACETAMINOPHEN 500 MG PO TABS
500.0000 mg | ORAL_TABLET | Freq: Four times a day (QID) | ORAL | Status: DC | PRN
  Administered 2022-01-17: 500 mg via ORAL
  Filled 2022-01-17: qty 1

## 2022-01-17 MED ORDER — HYDROCODONE-ACETAMINOPHEN 5-325 MG PO TABS
1.0000 | ORAL_TABLET | Freq: Once | ORAL | Status: AC
  Administered 2022-01-17: 1 via ORAL
  Filled 2022-01-17: qty 1

## 2022-01-17 NOTE — Progress Notes (Signed)
[facility] Gastroenterology Progress Note  Marie Lyons 72 y.o. 1950/08/15  CC: Abdominal pain   Subjective: Patient seen and examined at bedside.  Feeling hungry and wants to have regular diet.  Continues to have left-sided abdominal pain radiating towards back  ROS : Negative for nausea vomiting  Objective: Vital signs in last 24 hours: Vitals:   01/16/22 2146 01/17/22 0512  BP: 136/76 (!) 154/82  Pulse: (!) 107 89  Resp:  16  Temp: 98.9 F (37.2 C) 98.6 F (37 C)  SpO2: 96% 95%    Physical Exam:  General:  Elderly appearing patient, not in acute distress  Head:  Normocephalic, without obvious abnormality, atraumatic  Eyes:  , EOM's intact,   Lungs:   Clear to auscultation bilaterally, respirations unlabored  Heart:  Regular rate and rhythm, S1, S2 normal  Abdomen:   Soft, left upper quadrant tenderness to palpation with the largest extending from left upper quadrant to left flank and towards the back.  No epigastric or right upper quadrant tenderness, bowel sounds present.  Extremities: Extremities normal, atraumatic, no  edema  Pulses: 2+ and symmetric    Lab Results: Recent Labs    01/16/22 0802 01/17/22 0407  NA 135 133*  K 4.3 4.2  CL 99 99  CO2 30 30  GLUCOSE 104* 96  BUN 8 5*  CREATININE 0.51 0.59  CALCIUM 8.8* 8.5*  MG 1.8 1.6*   Recent Labs    01/16/22 0802 01/17/22 0407  AST 43* 38  ALT 35 33  ALKPHOS 44 43  BILITOT 0.7 0.5  PROT 6.2* 5.4*  ALBUMIN 3.5 3.0*   Recent Labs    01/15/22 0117 01/17/22 0407  WBC 9.0 7.8  NEUTROABS  --  5.1  HGB 12.7 12.2  HCT 37.9 36.6  MCV 98.2 100.5*  PLT 182 152   No results for input(s): LABPROT, INR in the last 72 hours.    Assessment/Plan:  -Abdominal pain with nausea.  Minimally elevated AST on presentation.  CT scan initially showed finding concerning for tiny choledocholithiasis but MRI MRCP negative for such findings. HIDA scan negative.  LFTs normal now.    Recommendation ------------------------ -Left-sided abdominal pain is probably from shingles. -Offered EGD for further evaluation but patient declined. -Recommend to continue Protonix 40 mg twice a day for 4 weeks followed by Protonix 40 mg once a day. -No further inpatient GI work-up planned. -Once diet regular per patient's request -Recommend lidocaine patch for pain from shingles. -Discussed with hospitalist. -Follow-up in GI clinic in 2 months.  GI will sign off.  Call us back if needed   Otis Brace MD, [place] 01/17/2022, 12:44 PM  Contact #  [phone]

## 2022-01-17 NOTE — Progress Notes (Signed)
°   01/17/22 1200  Mobility  Activity Ambulated with assistance in hallway;Ambulated with assistance to bathroom  Level of Assistance Contact guard assist, steadying assist  Assistive Device Other (Comment) (IV pole)  Distance Ambulated (ft) 350 ft  Activity Response Tolerated well  $Mobility charge 1 Mobility   Pt agreeable to mobilize this morning. Requested to use the bathroom prior to session. Ambulated about 361ft with IV pole, tolerated well She did note that she was feeling "shaky" but otherwise no complaints. Pt requested to sit EOB. Instructed pt on pursed lipped breathing to aide in exhaustion. Pt left EOB with call bell at side and tray table pulled in front of her.   [place] Specialist Acute Rehab Services Office: [phone]

## 2022-01-17 NOTE — Discharge Summary (Signed)
DISCHARGE SUMMARY  Marie Lyons  MR#: [id number]  DOB:[birth year]  Date of Admission: 01/14/2022 Date of Discharge: 01/17/2022  Attending Physician:Brailyn Delman Hennie Duos, MD  Patient's NWG:NFAOZ, Herbie [place], MD  Consults: GI Gen Surgery   Disposition: D/C home   Follow-up Appts:  Follow-up Information     Gastroenterology, [facility]. Schedule an appointment as soon as possible for a visit in 2 month(s).   Why: Abdominal pain Contact information: [place] [address] [phone]         Maury Dus, MD Follow up.   Specialty: Family Medicine Contact information: [place] [address] [phone]                 Tests Needing Follow-up: -follow-up EKG to assess QTc is suggested  Discharge Diagnoses: Persistent L abdominal pain - ?postherpetic neuralgia  Elevated AST Prolonged QT Hyponatremia COPD HLD HTN Underweight - Body mass index is 17.54 kg/m.  Initial presentation: 72 year old with a history of COPD, HTN, HLD who presented originally to [facility] with abdominal pain fatigue anorexia and nausea. She was dehydrated and orthostatic w/ BP dropping as low as 78/50.  Initial CT abdomen/pelvis suggested choledocholithiasis with possible stones in the distal common bile duct and biliary dilatation.  She was transferred to [facility] for an inpatient workup. Of note she had been treated for L flank/back/anterior abdom wall shingles by her PCP ~ 2 weeks prior to this admission.   Hospital Course: 1/15 admitted with abdominal pain and nausea and vomiting - positive orthostasis 1/16 GI consulted, MRCP negative for any biliary obstruction or stone 1/17 General Surgery consulted, recommend no lap chole - recommend EGD but pt not interested and felt low yield per GI 1/18 HIDA unimpressive    Persistent abdominal pain - elevated AST Bilirubin normal -no evidence of cholecystitis or pancreatitis on  evaluation - MRCP unrevealing - General Surgery has evaluated and does not feel that this is biliary in etiology -HIDA scan unrevealing -at present our thought is that this represents a manifestation of postherpetic neuralgia -at the time of discharge the patient is able to tolerate oral intake without difficulty.  Her pain is paroxysmal and able to be managed.  She feels safe going home and her inpatient work-up is now complete.   Prolonged QT Likely simply related to hypokalemia -follow-up in the outpatient setting to assure this has resolved with resumption of regular diet is suggested   Hyponatremia Due to poor intake/hyponatremia -sodium stable at discharge and intake improving  COPD Well compensated during this hospitalization  HLD Simvastatin on hold due to above - resume after returning home with no evidence of biliary disease found during this admission   HTN Blood pressure has normalized/recovered during this hospital stay and is mildly elevated at time of discharge -resume usual blood pressure medications upon returning home  Underweight - Body mass index is 17.54 kg/m.  Allergies as of 01/17/2022       Reactions   Ciprofloxacin Shortness Of Breath   Denies this allergy on 09/23/16 (??)   Keflex [cephalexin] Anaphylaxis   Penicillins Anaphylaxis   Has patient had a PCN reaction causing immediate rash, facial/tongue/throat swelling, SOB or lightheadedness with hypotension: Yes Has patient had a PCN reaction causing severe rash involving mucus membranes or skin necrosis: Yes Has patient had a PCN reaction that required hospitalization Yes Has patient had a PCN reaction occurring within the last 10 years: No If all of the above  answers are "NO", then may proceed with Cephalosporin use.   Aspirin Other (See Comments)   Aggravates ulcer   Darvon [propoxyphene Hcl] Other (See Comments)   Wheezing   Ibuprofen Other (See Comments)   Wheezing   Imitrex [sumatriptan] Other (See  Comments)   Contraindicated because pt had a stroke   Meperidine Other (See Comments)   Unknown reaction (per patient)   Monosodium Glutamate Other (See Comments)   Migraine    Nitrates, Organic Other (See Comments)   Migraine    Nsaids Other (See Comments)   Aggravates ulcer   Sulfa Antibiotics Other (See Comments)   Unknown reaction (per patient)        Medication List     STOP taking these medications    valACYclovir 1000 MG tablet Commonly known as: VALTREX       TAKE these medications    acetaminophen 500 MG tablet Commonly known as: TYLENOL Take 500 mg by mouth every 6 (six) hours as needed for headache (or pain).   ARTIFICIAL TEAR OP Place 1 drop into both eyes daily.   calcium elemental as carbonate 400 MG chewable tablet Commonly known as: BARIATRIC TUMS ULTRA Chew 1,000 mg by mouth daily.   cholecalciferol 1000 units tablet Commonly known as: VITAMIN D Take 1,000 Units by mouth daily.   cyclobenzaprine 10 MG tablet Commonly known as: FLEXERIL Take 10 mg by mouth at bedtime.   diphenhydrAMINE 25 MG tablet Commonly known as: BENADRYL Take 1 tablet (25 mg total) by mouth every 6 (six) hours as needed. What changed: reasons to take this   EPINEPHrine 0.3 mg/0.3 mL Soaj injection Commonly known as: EPI-PEN Inject 0.3 mg into the muscle as needed for anaphylaxis.   escitalopram 20 MG tablet Commonly known as: LEXAPRO Take 20 mg by mouth daily.   estradiol 0.1 MG/24HR patch Commonly known as: VIVELLE-DOT Place 1 patch onto the skin 2 (two) times a week.   fluticasone 50 MCG/ACT nasal spray Commonly known as: FLONASE Place 2 sprays into the nose daily. What changed:  when to take this reasons to take this   HYDROcodone-acetaminophen 5-325 MG tablet Commonly known as: NORCO/VICODIN Take 1 tablet by mouth every 6 (six) hours as needed for moderate pain or severe pain.   Melatonin 10 MG Tabs Take 1 tablet by mouth at bedtime.   metoprolol  succinate 25 MG 24 hr tablet Commonly known as: TOPROL-XL Take 25 mg by mouth at bedtime.   multivitamin with minerals Tabs tablet Take 1 tablet by mouth daily.   ondansetron 8 MG disintegrating tablet Commonly known as: ZOFRAN-ODT Take 8 mg by mouth every 8 (eight) hours as needed for nausea or vomiting.   simvastatin 20 MG tablet Commonly known as: ZOCOR Take 20 mg by mouth at bedtime.   Ventolin HFA 108 (90 Base) MCG/ACT inhaler Generic drug: albuterol Inhale 2 puffs into the lungs every 4 (four) hours as needed for wheezing or shortness of breath.   vitamin C 250 MG tablet Commonly known as: ASCORBIC ACID Take 250 mg by mouth daily.        Day of Discharge BP (!) 154/105 (BP Location: Left Arm)    Pulse (!) 110    Temp 98.5 F (36.9 C) (Oral)    Resp 18    Ht 5\' 3"  (1.6 m)    SpO2 96%    BMI 17.54 kg/m   Physical Exam: The patient is resting comfortably at her bedside.  She has just completed lunch  and is in no distress.  She is alert conversant and pleasant.  She indicates no significant abdominal pain at present.  Her respirations are comfortable and unlabored.  Basic Metabolic Panel: Recent Labs  Lab 01/14/22 1343 01/14/22 2306 01/15/22 0117 01/16/22 0802 01/17/22 0407  NA 133*  --  134* 135 133*  K 3.4*  --  3.1* 4.3 4.2  CL 89*  --  97* 99 99  CO2 33*  --  29 30 30   GLUCOSE 100*  --  94 104* 96  BUN 24*  --  17 8 5*  CREATININE 0.97  --  0.65 0.51 0.59  CALCIUM 9.9  --  8.2* 8.8* 8.5*  MG  --  1.9  --  1.8 1.6*    Liver Function Tests: Recent Labs  Lab 01/14/22 1515 01/15/22 0117 01/16/22 0802 01/17/22 0407  AST 68* 50* 43* 38  ALT 38 33 35 33  ALKPHOS 51 41 44 43  BILITOT 0.4 0.6 0.7 0.5  PROT 7.1 5.7* 6.2* 5.4*  ALBUMIN 4.1 3.3* 3.5 3.0*   Recent Labs  Lab 01/14/22 1515  LIPASE 27   CBC: Recent Labs  Lab 01/14/22 1343 01/15/22 0117 01/17/22 0407  WBC 13.8* 9.0 7.8  NEUTROABS  --   --  5.1  HGB 15.1* 12.7 12.2  HCT 46.1* 37.9  36.6  MCV 98.1 98.2 100.5*  PLT 228 182 152    CBG: Recent Labs  Lab 01/16/22 2148 01/17/22 1158  GLUCAP 151* 136*    Recent Results (from the past 240 hour(s))  Resp Panel by RT-PCR (Flu A&B, Covid) Nasopharyngeal Swab     Status: None   Collection Time: 01/14/22  1:43 PM   Specimen: Nasopharyngeal Swab; Nasopharyngeal(NP) swabs in vial transport medium  Result Value Ref Range Status   SARS Coronavirus 2 by RT PCR NEGATIVE NEGATIVE Final    Comment: (NOTE) SARS-CoV-2 target nucleic acids are NOT DETECTED.  The SARS-CoV-2 RNA is generally detectable in upper respiratory specimens during the acute phase of infection. The lowest concentration of SARS-CoV-2 viral copies this assay can detect is 138 copies/mL. A negative result does not preclude SARS-Cov-2 infection and should not be used as the sole basis for treatment or other patient management decisions. A negative result may occur with  improper specimen collection/handling, submission of specimen other than nasopharyngeal swab, presence of viral mutation(s) within the areas targeted by this assay, and inadequate number of viral copies(<138 copies/mL). A negative result must be combined with clinical observations, patient history, and epidemiological information. The expected result is Negative.  Fact Sheet for Patients:  EntrepreneurPulse.com.au  Fact Sheet for Healthcare Providers:  IncredibleEmployment.be  This test is no t yet approved or cleared by the [place] FDA and  has been authorized for detection and/or diagnosis of SARS-CoV-2 by FDA under an Emergency Use Authorization (EUA). This EUA will remain  in effect (meaning this test can be used) for the duration of the COVID-19 declaration under Section 564(b)(1) of the Act, 21 U.S.C.section 360bbb-3(b)(1), unless the authorization is terminated  or revoked sooner.       Influenza A by PCR NEGATIVE NEGATIVE Final    Influenza B by PCR NEGATIVE NEGATIVE Final    Comment: (NOTE) The Xpert Xpress SARS-CoV-2/FLU/RSV plus assay is intended as an aid in the diagnosis of influenza from Nasopharyngeal swab specimens and should not be used as a sole basis for treatment. Nasal washings and aspirates are unacceptable for Xpert Xpress SARS-CoV-2/FLU/RSV testing.  Fact  Sheet for Patients: EntrepreneurPulse.com.au  Fact Sheet for Healthcare Providers: IncredibleEmployment.be  This test is not yet approved or cleared by the [place] FDA and has been authorized for detection and/or diagnosis of SARS-CoV-2 by FDA under an Emergency Use Authorization (EUA). This EUA will remain in effect (meaning this test can be used) for the duration of the COVID-19 declaration under Section 564(b)(1) of the Act, 21 U.S.C. section 360bbb-3(b)(1), unless the authorization is terminated or revoked.  Performed at [facility], [address], [place], [place] [address]   Culture, blood (single)     Status: None (Preliminary result)   Collection Time: 01/14/22  3:30 PM   Specimen: BLOOD  Result Value Ref Range Status   Specimen Description   Final    BLOOD LEFT ANTECUBITAL Performed at [facility] [address], [place], [place] [address]    Special Requests   Final    BOTTLES DRAWN AEROBIC AND ANAEROBIC Blood Culture adequate volume Performed at [facility], [address] [place], [place] [address]    Culture   Final    NO GROWTH 3 DAYS Performed at [facility], [place] [address] [place],  [address]    Report Status PENDING  Incomplete      Time spent in discharge (includes decision making & examination of pt): 30 minutes  01/17/2022, 1:53 PM   Cherene Altes, MD [facility] Office  [phone]

## 2022-01-19 LAB — CULTURE, BLOOD (SINGLE)
Culture: NO GROWTH
Special Requests: ADEQUATE

## 2022-01-31 DIAGNOSIS — B0229 Other postherpetic nervous system involvement: Secondary | ICD-10-CM | POA: Diagnosis not present

## 2022-01-31 DIAGNOSIS — R9431 Abnormal electrocardiogram [ECG] [EKG]: Secondary | ICD-10-CM | POA: Diagnosis not present

## 2022-01-31 DIAGNOSIS — M7022 Olecranon bursitis, left elbow: Secondary | ICD-10-CM | POA: Diagnosis not present

## 2022-01-31 DIAGNOSIS — E860 Dehydration: Secondary | ICD-10-CM | POA: Diagnosis not present

## 2022-01-31 DIAGNOSIS — R1084 Generalized abdominal pain: Secondary | ICD-10-CM | POA: Diagnosis not present

## 2022-03-22 DIAGNOSIS — R52 Pain, unspecified: Secondary | ICD-10-CM | POA: Diagnosis not present

## 2022-03-22 DIAGNOSIS — J069 Acute upper respiratory infection, unspecified: Secondary | ICD-10-CM | POA: Diagnosis not present

## 2022-03-22 DIAGNOSIS — R062 Wheezing: Secondary | ICD-10-CM | POA: Diagnosis not present

## 2022-03-22 DIAGNOSIS — R051 Acute cough: Secondary | ICD-10-CM | POA: Diagnosis not present

## 2022-04-11 ENCOUNTER — Other Ambulatory Visit: Payer: Self-pay | Admitting: Physician Assistant

## 2022-04-11 ENCOUNTER — Ambulatory Visit
Admission: RE | Admit: 2022-04-11 | Discharge: 2022-04-11 | Disposition: A | Discharge status: Home / Self Care | Payer: Medicare HMO | Source: Ambulatory Visit | Attending: Physician Assistant | Admitting: Physician Assistant

## 2022-04-11 DIAGNOSIS — R052 Subacute cough: Secondary | ICD-10-CM

## 2022-04-11 DIAGNOSIS — M419 Scoliosis, unspecified: Secondary | ICD-10-CM | POA: Diagnosis not present

## 2022-04-11 DIAGNOSIS — J439 Emphysema, unspecified: Secondary | ICD-10-CM | POA: Diagnosis not present

## 2022-04-11 DIAGNOSIS — J302 Other seasonal allergic rhinitis: Secondary | ICD-10-CM | POA: Diagnosis not present

## 2022-05-17 DIAGNOSIS — N159 Renal tubulo-interstitial disease, unspecified: Secondary | ICD-10-CM | POA: Diagnosis not present

## 2022-05-17 DIAGNOSIS — R062 Wheezing: Secondary | ICD-10-CM | POA: Diagnosis not present

## 2022-06-05 IMAGING — CT CT ABD-PELV W/ CM
2 of 5 series · 16 of 46 positions shown, 18 images · IV contrast (agent unspecified)
Comparison: Noncontrast CT on 11/18/2020

CLINICAL DATA: Acute abdominal pain and nausea.

EXAM:
CT ABDOMEN AND PELVIS WITH CONTRAST
TECHNIQUE: Multidetector CT imaging of the abdomen and pelvis was performed
using the standard protocol following bolus administration of
intravenous contrast.

[Series 2: abd pel w · axial · 0.67mm/px · z∈[+645,+975]mm · 13 of 76 slices shown, 15 images]
[im 5/76  soft-tissue]
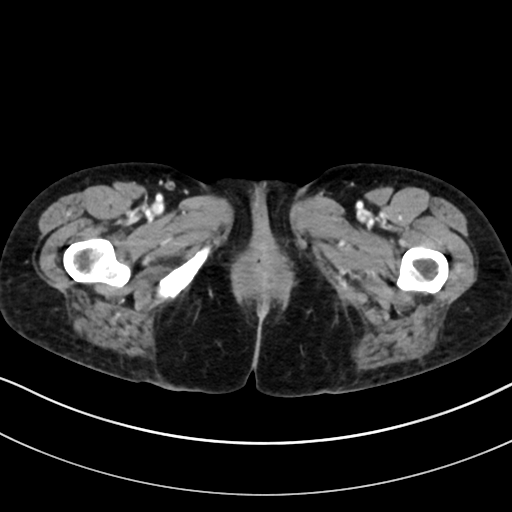
[im 5/76  bone]
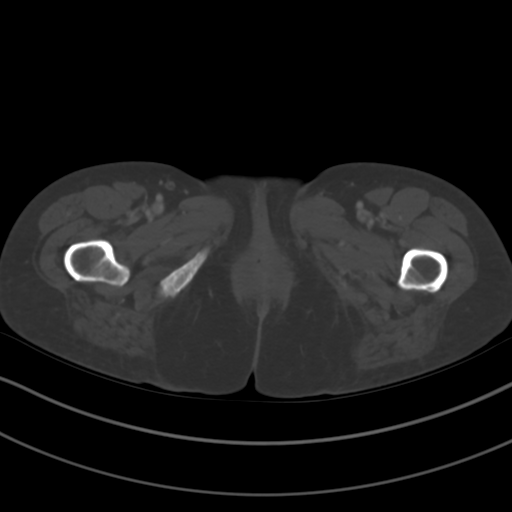
[im 9/76  soft-tissue]
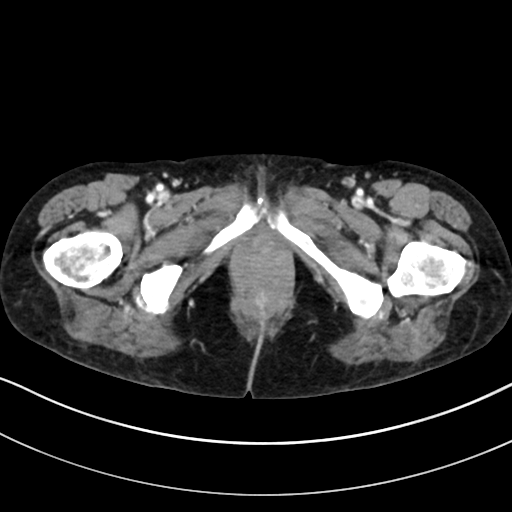
[im 17/76  soft-tissue]
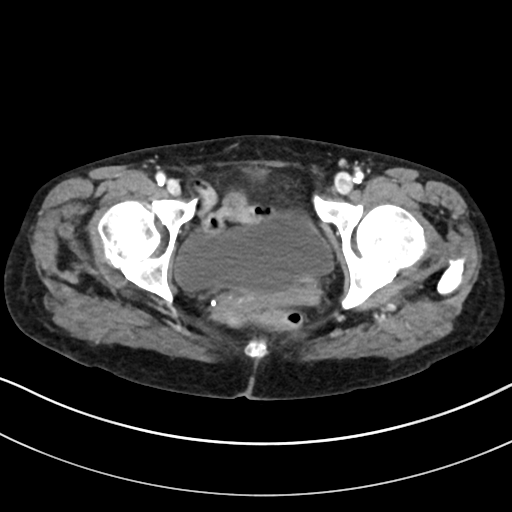
[im 21/76  soft-tissue]
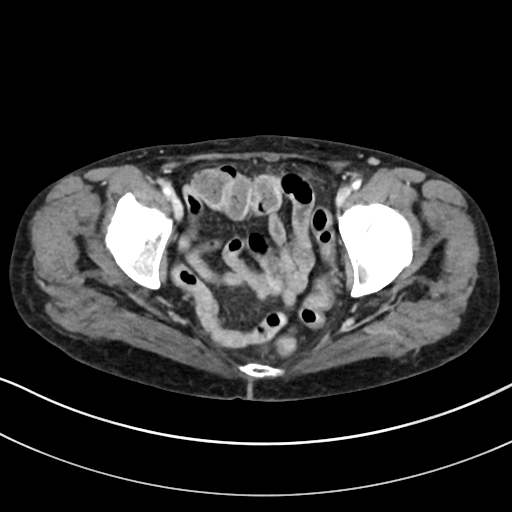
[im 26/76  soft-tissue]
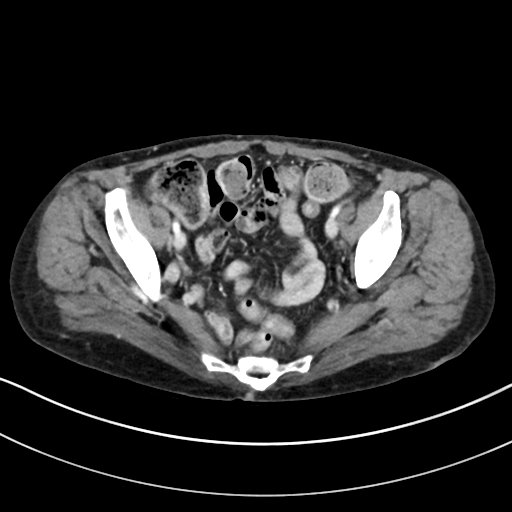
[im 34/76  soft-tissue]
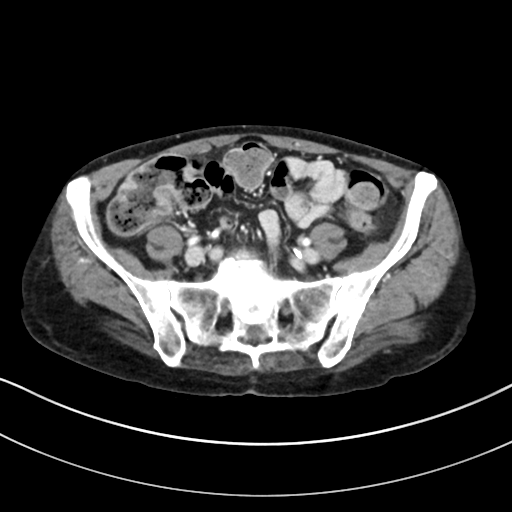
[im 38/76  soft-tissue]
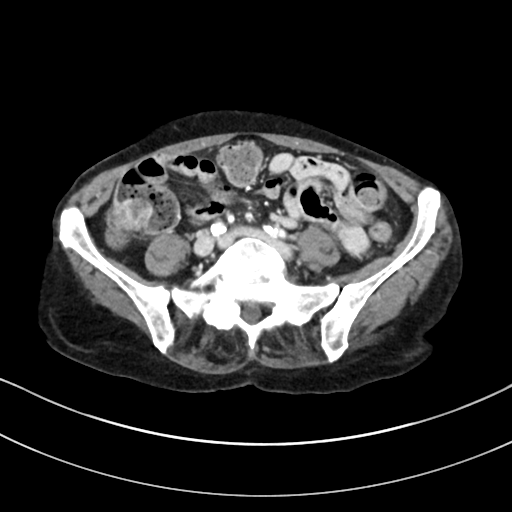
[im 42/76  soft-tissue]
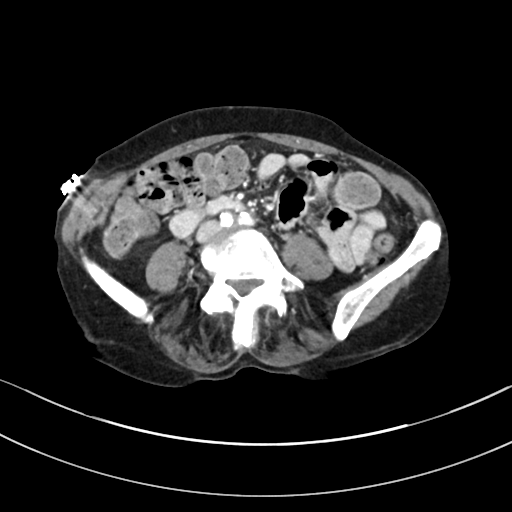
[im 51/76  soft-tissue]
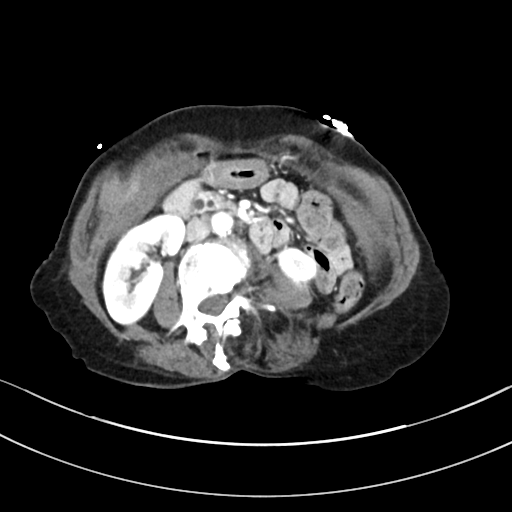
[im 51/76  bone]
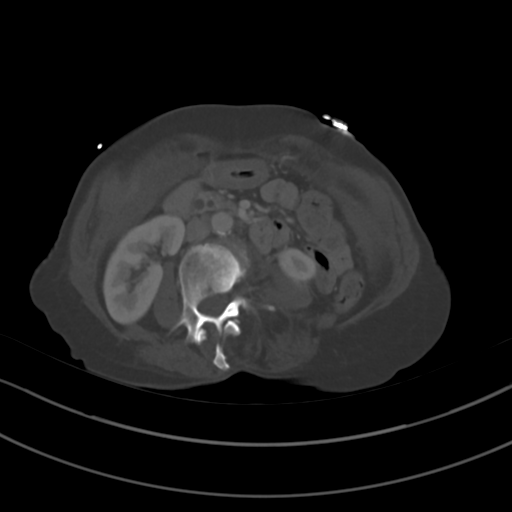
[im 55/76  soft-tissue]
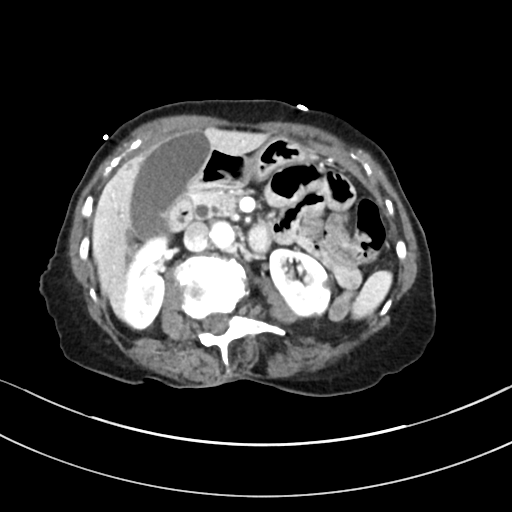
[im 59/76  soft-tissue]
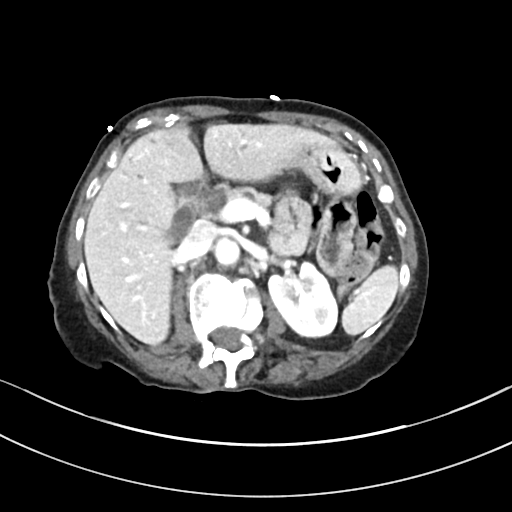
[im 67/76  soft-tissue]
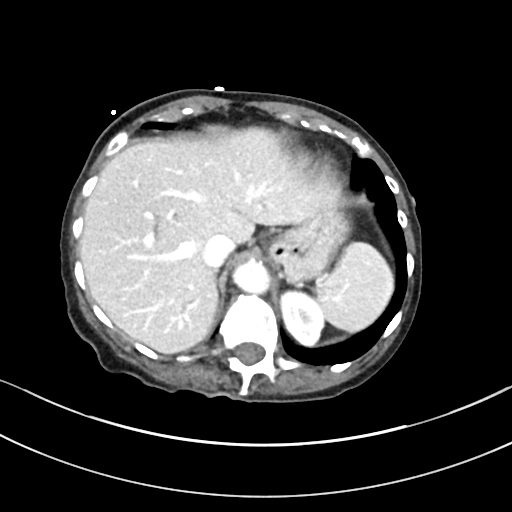
[im 71/76  soft-tissue]
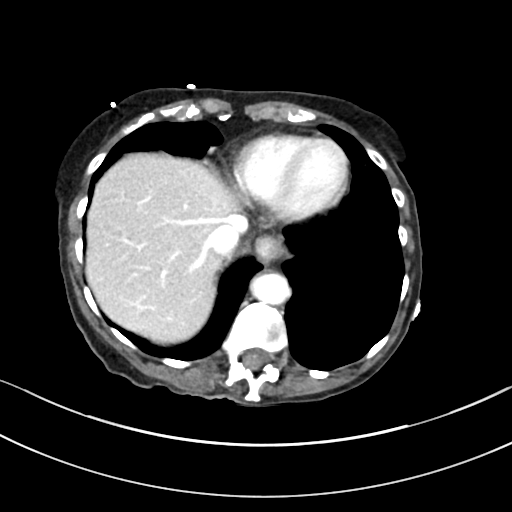

[Series 5: coronal · coronal · 0.70mm/px · 3 of 77 slices shown]
[im 26/77  soft-tissue]
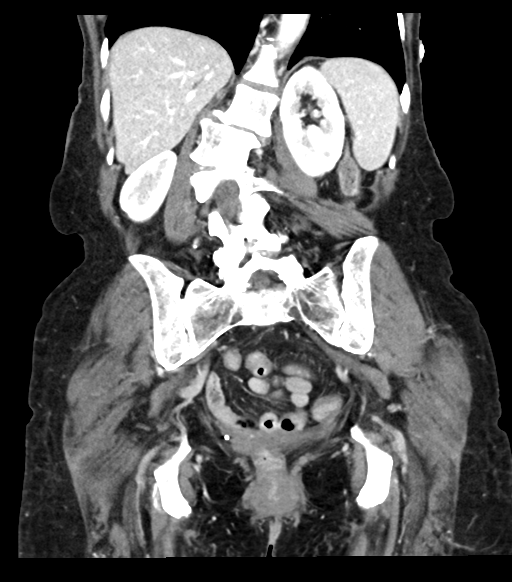
[im 34/77  soft-tissue]
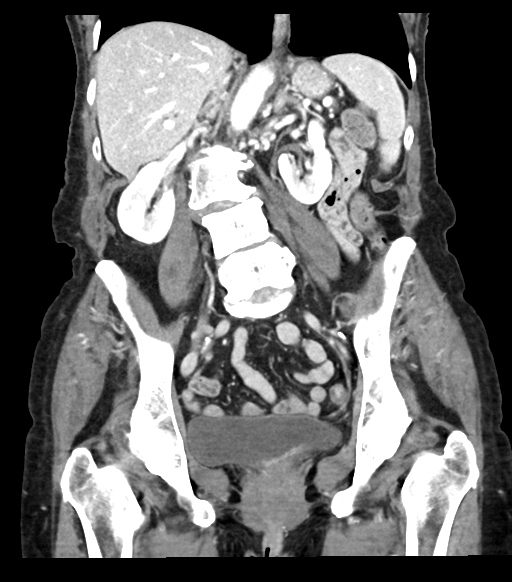
[im 43/77  soft-tissue]
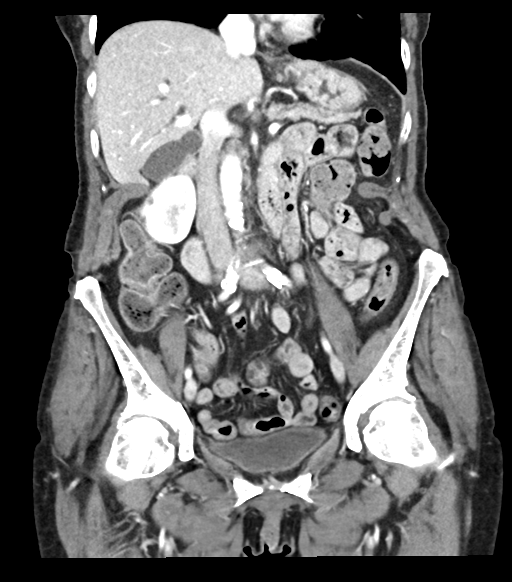

[16 of 46 positions shown; findings below may reference images not displayed]

RADIATION DOSE REDUCTION: This exam was performed according to the
departmental dose-optimization program which includes automated
exposure control, adjustment of the mA and/or kV according to
patient size and/or use of iterative reconstruction technique.

CONTRAST:  80mL OMNIPAQUE IOHEXOL 300 MG/ML  SOLN
FINDINGS: Lower Chest: No acute findings.

Hepatobiliary: No hepatic masses identified. Gallbladder is
unremarkable. Mild diffuse biliary ductal dilatation is seen, with
common bile duct measuring 10 mm in diameter. Two tiny
radiodensities are seen in the distal common bile duct, highly
suspicious for choledocholithiasis.

Pancreas:  No mass or inflammatory changes.

Spleen: Within normal limits in size and appearance.

Adrenals/Urinary Tract: No masses identified. Tiny subcapsular left
renal cyst noted. No evidence of ureteral calculi or hydronephrosis.

Stomach/Bowel: No evidence of obstruction, inflammatory process or
abnormal fluid collections. Normal appendix visualized.

Vascular/Lymphatic: No pathologically enlarged lymph nodes. No acute
vascular findings. Aortic atherosclerotic calcification noted.

Reproductive: Prior hysterectomy noted. Adnexal regions are
unremarkable in appearance.

Other:  None.

Musculoskeletal: No suspicious bone lesions identified. Lumbar spine
degenerative changes and dextroscoliosis noted.
IMPRESSION: Mild diffuse biliary ductal dilatation, with tiny radiodensities in
distal common bile duct, highly suspicious for choledocholithiasis.

Aortic Atherosclerosis ([id number]-049.9).

## 2022-06-05 IMAGING — DX DG CHEST 2V
2 series · 2 of 2 positions shown · non-contrast
Comparison: 05/30/2021.

CLINICAL DATA: Patient reports to the ER for lack of PO intake,
dizziness and nausea. Patient reports this started x5 days ago.
[REDACTED] night had emesis.

EXAM:
CHEST - 2 VIEW

[chest pa]
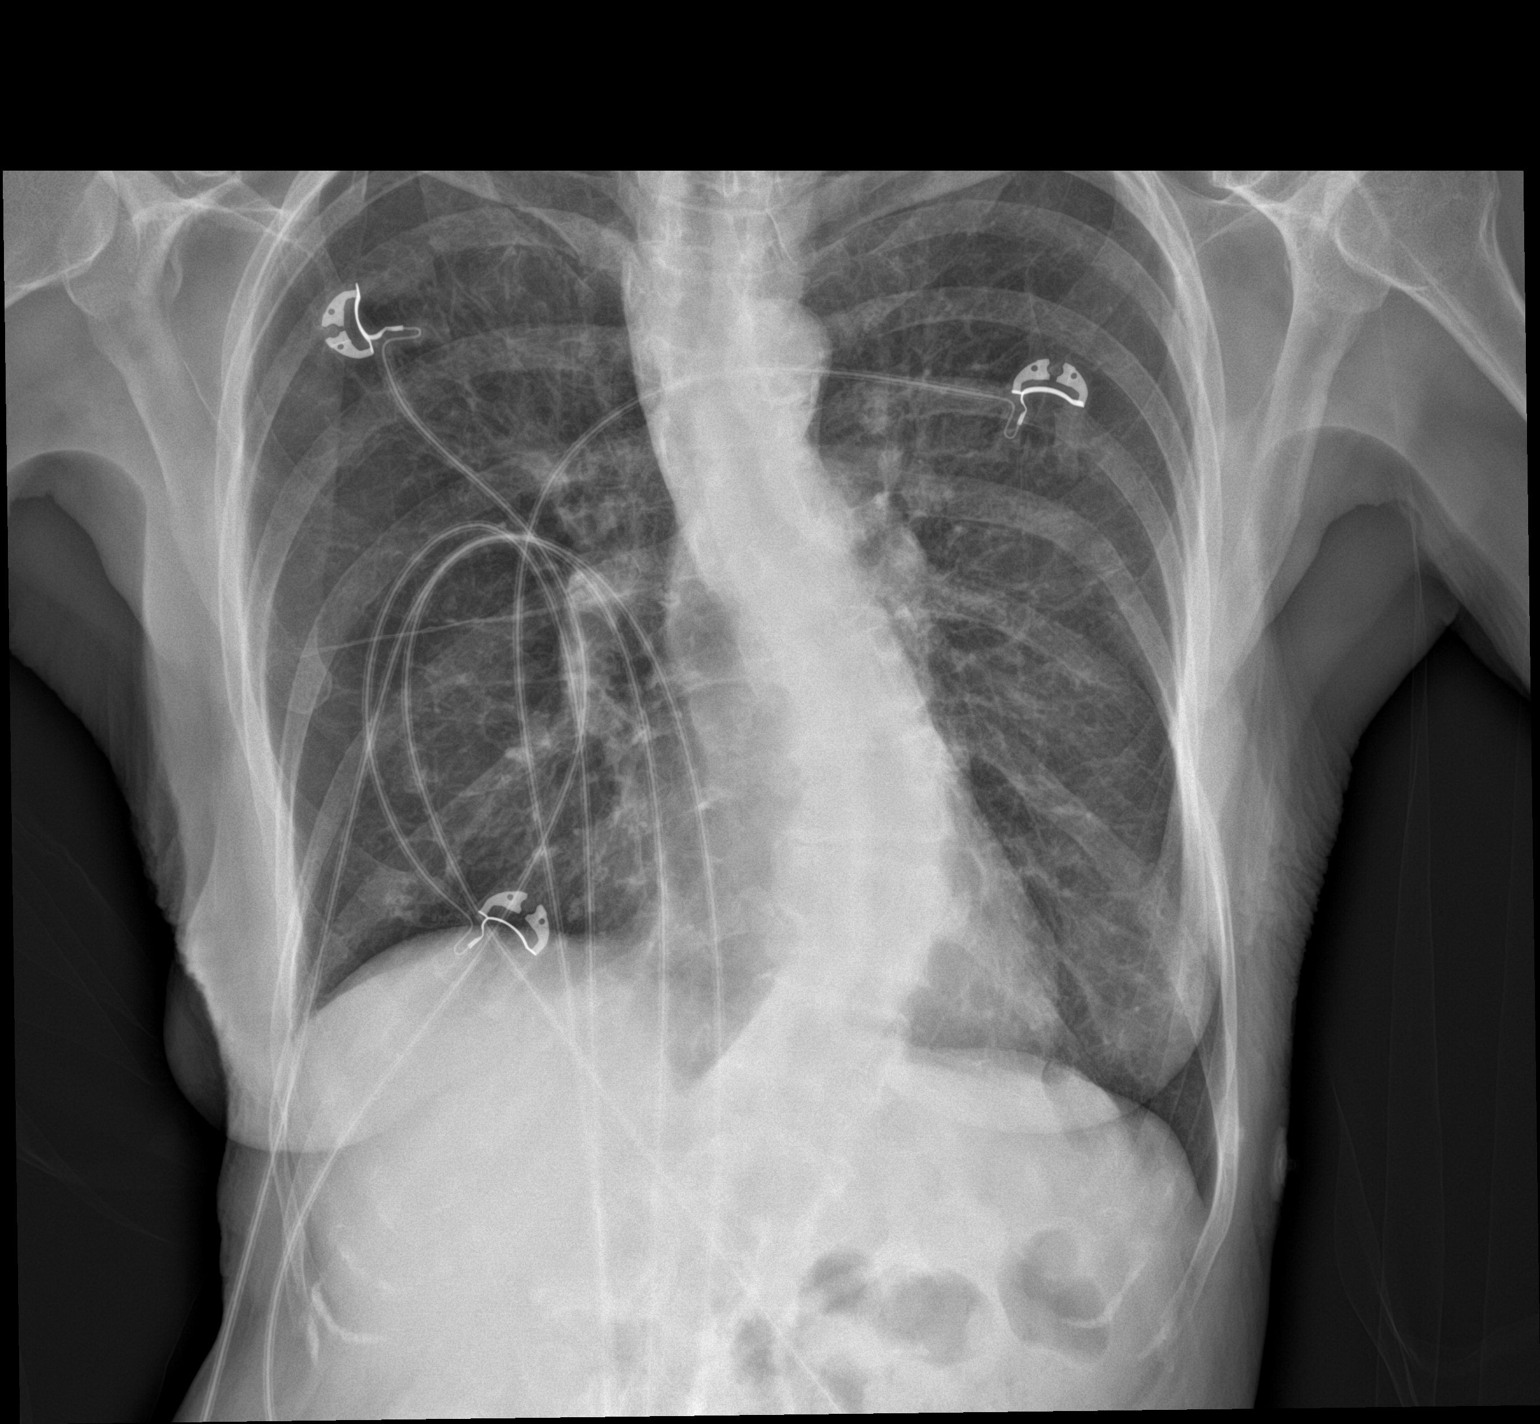

[chest lat]
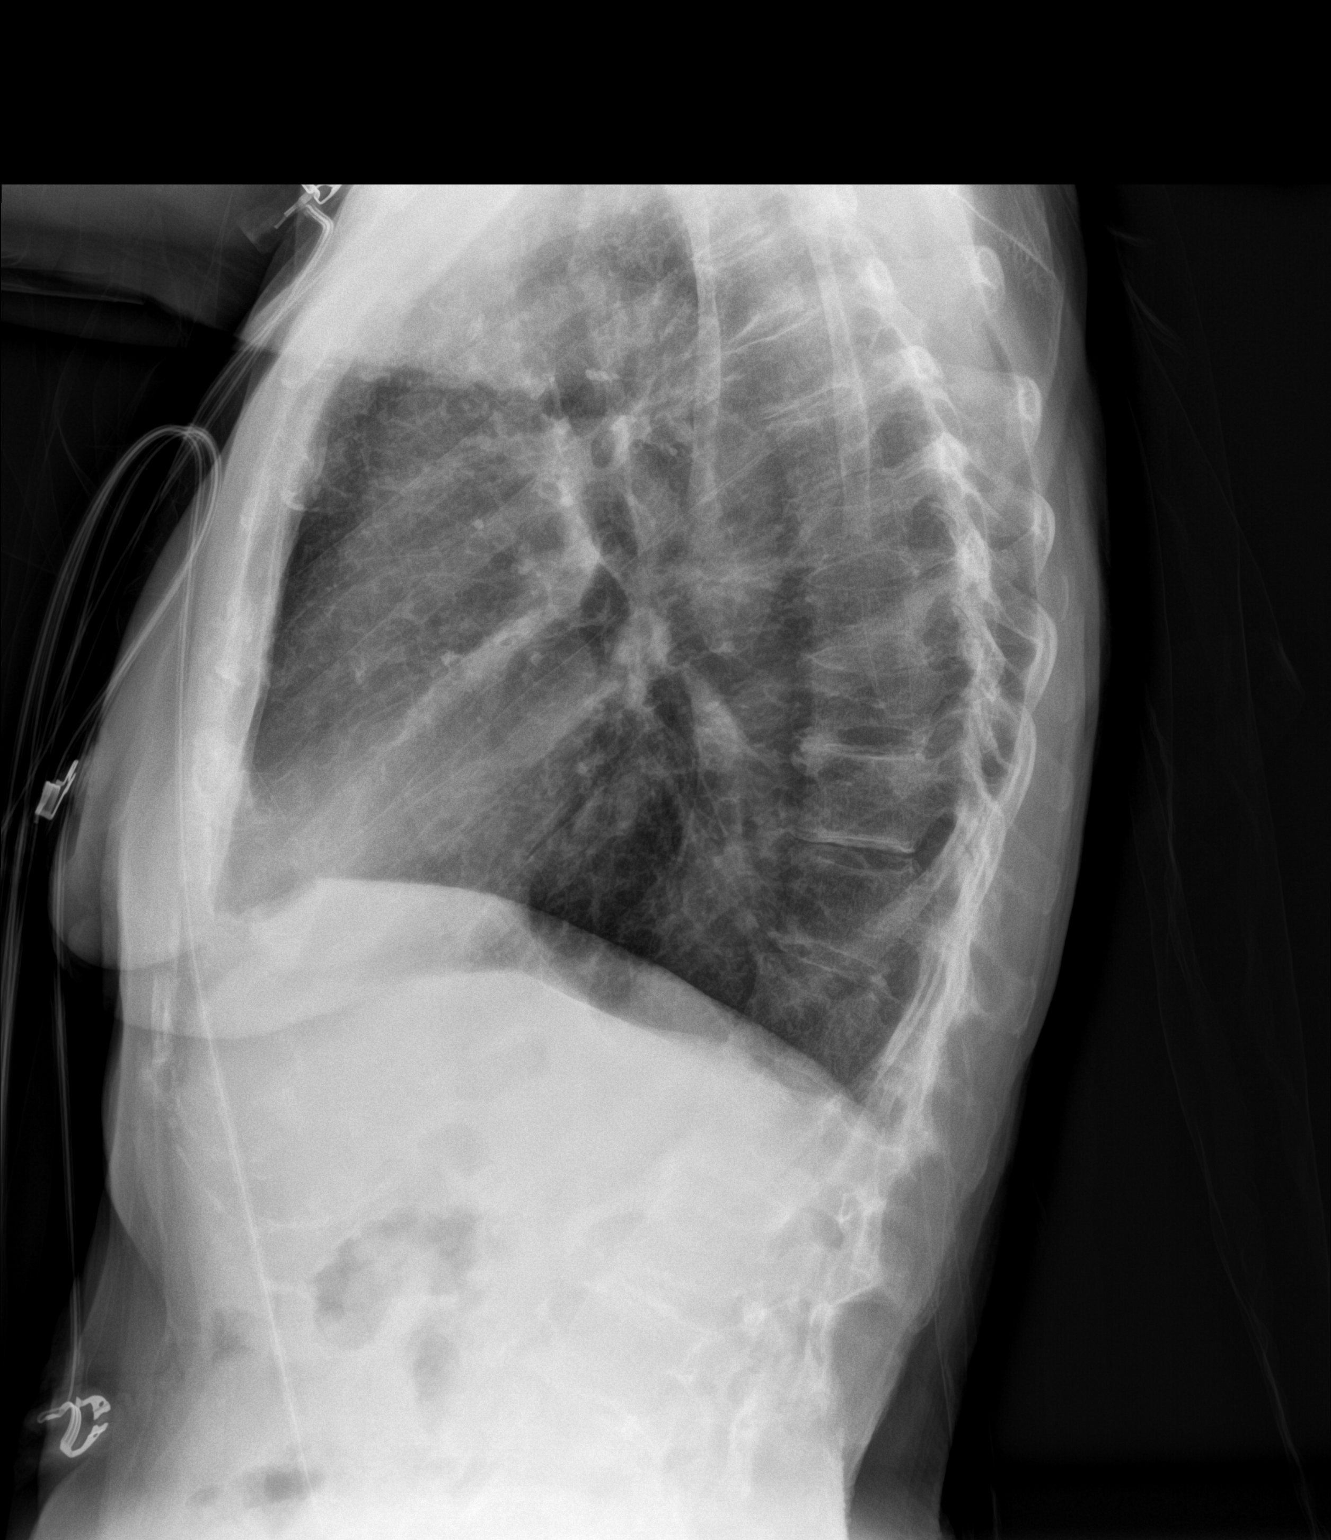

[2 of 2 positions shown; findings below may reference images not displayed]

FINDINGS: Cardiac silhouette is normal in size. No mediastinal or hilar masses
or evidence of adenopathy.

Lungs are hyperexpanded, but clear. No pleural effusion or
pneumothorax.

Stable thoracolumbar scoliosis. Skeletal structures are grossly
intact.
IMPRESSION: No acute cardiopulmonary disease.

## 2022-06-27 DIAGNOSIS — Z1231 Encounter for screening mammogram for malignant neoplasm of breast: Secondary | ICD-10-CM | POA: Diagnosis not present

## 2022-06-27 DIAGNOSIS — M85852 Other specified disorders of bone density and structure, left thigh: Secondary | ICD-10-CM | POA: Diagnosis not present

## 2022-06-27 DIAGNOSIS — Z780 Asymptomatic menopausal state: Secondary | ICD-10-CM | POA: Diagnosis not present

## 2022-06-27 DIAGNOSIS — M85851 Other specified disorders of bone density and structure, right thigh: Secondary | ICD-10-CM | POA: Diagnosis not present

## 2022-06-28 DIAGNOSIS — D649 Anemia, unspecified: Secondary | ICD-10-CM | POA: Diagnosis not present

## 2022-06-28 DIAGNOSIS — J410 Simple chronic bronchitis: Secondary | ICD-10-CM | POA: Diagnosis not present

## 2022-06-28 DIAGNOSIS — I10 Essential (primary) hypertension: Secondary | ICD-10-CM | POA: Diagnosis not present

## 2022-06-28 DIAGNOSIS — D692 Other nonthrombocytopenic purpura: Secondary | ICD-10-CM | POA: Diagnosis not present

## 2022-06-28 DIAGNOSIS — M62838 Other muscle spasm: Secondary | ICD-10-CM | POA: Diagnosis not present

## 2022-06-28 DIAGNOSIS — E46 Unspecified protein-calorie malnutrition: Secondary | ICD-10-CM | POA: Diagnosis not present

## 2022-06-28 DIAGNOSIS — F331 Major depressive disorder, recurrent, moderate: Secondary | ICD-10-CM | POA: Diagnosis not present

## 2022-06-28 DIAGNOSIS — E7800 Pure hypercholesterolemia, unspecified: Secondary | ICD-10-CM | POA: Diagnosis not present

## 2022-06-28 DIAGNOSIS — Z23 Encounter for immunization: Secondary | ICD-10-CM | POA: Diagnosis not present

## 2022-07-23 DIAGNOSIS — F331 Major depressive disorder, recurrent, moderate: Secondary | ICD-10-CM | POA: Diagnosis not present

## 2022-07-23 DIAGNOSIS — I10 Essential (primary) hypertension: Secondary | ICD-10-CM | POA: Diagnosis not present

## 2022-07-23 DIAGNOSIS — E7800 Pure hypercholesterolemia, unspecified: Secondary | ICD-10-CM | POA: Diagnosis not present

## 2022-07-23 DIAGNOSIS — J449 Chronic obstructive pulmonary disease, unspecified: Secondary | ICD-10-CM | POA: Diagnosis not present

## 2022-08-07 DIAGNOSIS — S86011A Strain of right Achilles tendon, initial encounter: Secondary | ICD-10-CM | POA: Diagnosis not present

## 2022-08-28 DIAGNOSIS — M79641 Pain in right hand: Secondary | ICD-10-CM | POA: Diagnosis not present

## 2022-09-26 DIAGNOSIS — R399 Unspecified symptoms and signs involving the genitourinary system: Secondary | ICD-10-CM | POA: Diagnosis not present

## 2022-09-26 DIAGNOSIS — N390 Urinary tract infection, site not specified: Secondary | ICD-10-CM | POA: Diagnosis not present

## 2022-10-03 DIAGNOSIS — H6122 Impacted cerumen, left ear: Secondary | ICD-10-CM | POA: Diagnosis not present

## 2022-10-03 DIAGNOSIS — R3915 Urgency of urination: Secondary | ICD-10-CM | POA: Diagnosis not present

## 2022-10-03 DIAGNOSIS — Z23 Encounter for immunization: Secondary | ICD-10-CM | POA: Diagnosis not present

## 2022-10-03 DIAGNOSIS — M545 Low back pain, unspecified: Secondary | ICD-10-CM | POA: Diagnosis not present

## 2022-10-30 DIAGNOSIS — R3915 Urgency of urination: Secondary | ICD-10-CM | POA: Diagnosis not present

## 2022-10-30 DIAGNOSIS — H6122 Impacted cerumen, left ear: Secondary | ICD-10-CM | POA: Diagnosis not present

## 2022-11-30 DEATH — deceased
